# Patient Record
Sex: Female | Born: 1960 | Race: Black or African American | Hispanic: No | Marital: Married | Smoking: Never smoker
Health system: Southern US, Community
[De-identification: ages and names within clinical notes are randomized; demographics above are authoritative.]

## PROBLEM LIST (undated history)

## (undated) DIAGNOSIS — J45909 Unspecified asthma, uncomplicated: Secondary | ICD-10-CM

## (undated) DIAGNOSIS — M5126 Other intervertebral disc displacement, lumbar region: Secondary | ICD-10-CM

## (undated) DIAGNOSIS — E669 Obesity, unspecified: Secondary | ICD-10-CM

## (undated) DIAGNOSIS — I10 Essential (primary) hypertension: Secondary | ICD-10-CM

## (undated) DIAGNOSIS — M1990 Unspecified osteoarthritis, unspecified site: Secondary | ICD-10-CM

## (undated) DIAGNOSIS — R0602 Shortness of breath: Secondary | ICD-10-CM

## (undated) DIAGNOSIS — M2550 Pain in unspecified joint: Secondary | ICD-10-CM

## (undated) DIAGNOSIS — I509 Heart failure, unspecified: Secondary | ICD-10-CM

## (undated) DIAGNOSIS — R7303 Prediabetes: Secondary | ICD-10-CM

## (undated) DIAGNOSIS — M069 Rheumatoid arthritis, unspecified: Secondary | ICD-10-CM

## (undated) DIAGNOSIS — G4730 Sleep apnea, unspecified: Secondary | ICD-10-CM

## (undated) HISTORY — DX: Pain in unspecified joint: M25.50

## (undated) HISTORY — DX: Shortness of breath: R06.02

## (undated) HISTORY — DX: Unspecified osteoarthritis, unspecified site: M19.90

## (undated) HISTORY — PX: OTHER SURGICAL HISTORY: SHX169

## (undated) HISTORY — DX: Rheumatoid arthritis, unspecified: M06.9

## (undated) HISTORY — DX: Heart failure, unspecified: I50.9

## (undated) HISTORY — DX: Obesity, unspecified: E66.9

## (undated) HISTORY — DX: Other intervertebral disc displacement, lumbar region: M51.26

## (undated) HISTORY — PX: BREAST SURGERY: SHX581

---

## 1998-07-13 ENCOUNTER — Other Ambulatory Visit: Admission: RE | Admit: 1998-07-13 | Discharge: 1998-07-13 | Payer: Self-pay | Admitting: Obstetrics & Gynecology

## 1999-09-05 ENCOUNTER — Ambulatory Visit (HOSPITAL_COMMUNITY): Admission: RE | Admit: 1999-09-05 | Discharge: 1999-09-05 | Payer: Self-pay | Admitting: *Deleted

## 1999-12-19 ENCOUNTER — Other Ambulatory Visit: Admission: RE | Admit: 1999-12-19 | Discharge: 1999-12-19 | Payer: Self-pay | Admitting: Family Medicine

## 2000-01-02 ENCOUNTER — Ambulatory Visit (HOSPITAL_COMMUNITY): Admission: RE | Admit: 2000-01-02 | Discharge: 2000-01-02 | Payer: Self-pay | Admitting: Family Medicine

## 2000-01-02 ENCOUNTER — Encounter: Payer: Self-pay | Admitting: Family Medicine

## 2000-09-01 ENCOUNTER — Ambulatory Visit (HOSPITAL_BASED_OUTPATIENT_CLINIC_OR_DEPARTMENT_OTHER): Admission: RE | Admit: 2000-09-01 | Discharge: 2000-09-01 | Payer: Self-pay | Admitting: General Surgery

## 2000-09-07 ENCOUNTER — Other Ambulatory Visit: Admission: RE | Admit: 2000-09-07 | Discharge: 2000-09-07 | Payer: Self-pay | Admitting: Obstetrics and Gynecology

## 2000-12-16 ENCOUNTER — Ambulatory Visit (HOSPITAL_COMMUNITY): Admission: RE | Admit: 2000-12-16 | Discharge: 2000-12-16 | Payer: Self-pay | Admitting: Family Medicine

## 2000-12-16 ENCOUNTER — Encounter: Payer: Self-pay | Admitting: Family Medicine

## 2001-01-19 ENCOUNTER — Encounter: Payer: Self-pay | Admitting: Emergency Medicine

## 2001-01-19 ENCOUNTER — Emergency Department (HOSPITAL_COMMUNITY): Admission: EM | Admit: 2001-01-19 | Discharge: 2001-01-19 | Payer: Self-pay | Admitting: Emergency Medicine

## 2001-02-10 ENCOUNTER — Encounter: Payer: Self-pay | Admitting: Orthopedic Surgery

## 2001-02-10 ENCOUNTER — Encounter: Admission: RE | Admit: 2001-02-10 | Discharge: 2001-02-10 | Payer: Self-pay | Admitting: Orthopedic Surgery

## 2001-11-24 HISTORY — PX: REDUCTION MAMMAPLASTY: SUR839

## 2001-12-17 ENCOUNTER — Encounter: Payer: Self-pay | Admitting: Family Medicine

## 2001-12-17 ENCOUNTER — Ambulatory Visit (HOSPITAL_COMMUNITY): Admission: RE | Admit: 2001-12-17 | Discharge: 2001-12-17 | Payer: Self-pay | Admitting: Family Medicine

## 2002-04-27 ENCOUNTER — Other Ambulatory Visit: Admission: RE | Admit: 2002-04-27 | Discharge: 2002-04-27 | Payer: Self-pay | Admitting: Family Medicine

## 2002-12-26 ENCOUNTER — Encounter: Payer: Self-pay | Admitting: Family Medicine

## 2002-12-26 ENCOUNTER — Ambulatory Visit (HOSPITAL_COMMUNITY): Admission: RE | Admit: 2002-12-26 | Discharge: 2002-12-26 | Payer: Self-pay | Admitting: Family Medicine

## 2003-01-23 ENCOUNTER — Encounter: Admission: RE | Admit: 2003-01-23 | Discharge: 2003-01-23 | Payer: Self-pay | Admitting: Family Medicine

## 2003-01-23 ENCOUNTER — Encounter: Payer: Self-pay | Admitting: Family Medicine

## 2003-12-25 ENCOUNTER — Other Ambulatory Visit: Admission: RE | Admit: 2003-12-25 | Discharge: 2003-12-25 | Payer: Self-pay | Admitting: Family Medicine

## 2003-12-27 ENCOUNTER — Encounter: Admission: RE | Admit: 2003-12-27 | Discharge: 2003-12-27 | Payer: Self-pay | Admitting: Family Medicine

## 2003-12-29 ENCOUNTER — Ambulatory Visit (HOSPITAL_COMMUNITY): Admission: RE | Admit: 2003-12-29 | Discharge: 2003-12-29 | Payer: Self-pay | Admitting: Plastic Surgery

## 2004-02-06 ENCOUNTER — Encounter: Admission: RE | Admit: 2004-02-06 | Discharge: 2004-02-06 | Payer: Self-pay | Admitting: Family Medicine

## 2005-01-13 ENCOUNTER — Ambulatory Visit (HOSPITAL_COMMUNITY): Admission: RE | Admit: 2005-01-13 | Discharge: 2005-01-13 | Payer: Self-pay | Admitting: Family Medicine

## 2005-02-28 ENCOUNTER — Other Ambulatory Visit: Admission: RE | Admit: 2005-02-28 | Discharge: 2005-02-28 | Payer: Self-pay | Admitting: Family Medicine

## 2006-01-19 ENCOUNTER — Ambulatory Visit (HOSPITAL_COMMUNITY): Admission: RE | Admit: 2006-01-19 | Discharge: 2006-01-19 | Payer: Self-pay | Admitting: Family Medicine

## 2006-03-03 ENCOUNTER — Other Ambulatory Visit: Admission: RE | Admit: 2006-03-03 | Discharge: 2006-03-03 | Payer: Self-pay | Admitting: Family Medicine

## 2006-03-12 ENCOUNTER — Ambulatory Visit (HOSPITAL_COMMUNITY): Admission: RE | Admit: 2006-03-12 | Discharge: 2006-03-12 | Payer: Self-pay | Admitting: Family Medicine

## 2006-03-27 ENCOUNTER — Ambulatory Visit (HOSPITAL_COMMUNITY): Admission: RE | Admit: 2006-03-27 | Discharge: 2006-03-27 | Payer: Self-pay | Admitting: Family Medicine

## 2007-01-20 ENCOUNTER — Ambulatory Visit (HOSPITAL_COMMUNITY): Admission: RE | Admit: 2007-01-20 | Discharge: 2007-01-20 | Payer: Self-pay | Admitting: Emergency Medicine

## 2007-03-22 ENCOUNTER — Other Ambulatory Visit: Admission: RE | Admit: 2007-03-22 | Discharge: 2007-03-22 | Payer: Self-pay | Admitting: Emergency Medicine

## 2007-06-25 ENCOUNTER — Ambulatory Visit: Payer: Self-pay | Admitting: Gastroenterology

## 2007-06-25 LAB — CONVERTED CEMR LAB
ALT: 13 units/L (ref 0–35)
AST: 25 units/L (ref 0–37)
Albumin: 3.2 g/dL — ABNORMAL LOW (ref 3.5–5.2)
Alkaline Phosphatase: 81 units/L (ref 39–117)
BUN: 12 mg/dL (ref 6–23)
Calcium: 9.4 mg/dL (ref 8.4–10.5)
Chloride: 102 meq/L (ref 96–112)
Eosinophils Absolute: 0.1 10*3/uL (ref 0.0–0.6)
GFR calc Af Amer: 115 mL/min
GFR calc non Af Amer: 95 mL/min
Lymphocytes Relative: 39.1 % (ref 12.0–46.0)
MCV: 84.6 fL (ref 78.0–100.0)
Monocytes Relative: 5.7 % (ref 3.0–11.0)
Neutro Abs: 4 10*3/uL (ref 1.4–7.7)
Platelets: 285 10*3/uL (ref 150–400)
RBC: 4.14 M/uL (ref 3.87–5.11)
WBC: 7.5 10*3/uL (ref 4.5–10.5)

## 2007-06-26 ENCOUNTER — Encounter: Admission: RE | Admit: 2007-06-26 | Discharge: 2007-06-26 | Payer: Self-pay | Admitting: Gastroenterology

## 2007-07-27 ENCOUNTER — Ambulatory Visit: Payer: Self-pay | Admitting: Gastroenterology

## 2008-01-28 ENCOUNTER — Ambulatory Visit (HOSPITAL_COMMUNITY): Admission: RE | Admit: 2008-01-28 | Discharge: 2008-01-28 | Payer: Self-pay | Admitting: Emergency Medicine

## 2008-03-07 DIAGNOSIS — K5090 Crohn's disease, unspecified, without complications: Secondary | ICD-10-CM | POA: Insufficient documentation

## 2008-03-07 DIAGNOSIS — K612 Anorectal abscess: Secondary | ICD-10-CM | POA: Insufficient documentation

## 2008-03-07 DIAGNOSIS — I10 Essential (primary) hypertension: Secondary | ICD-10-CM | POA: Insufficient documentation

## 2008-03-27 ENCOUNTER — Other Ambulatory Visit: Admission: RE | Admit: 2008-03-27 | Discharge: 2008-03-27 | Payer: Self-pay | Admitting: Emergency Medicine

## 2009-01-29 ENCOUNTER — Ambulatory Visit (HOSPITAL_COMMUNITY): Admission: RE | Admit: 2009-01-29 | Discharge: 2009-01-29 | Payer: Self-pay | Admitting: Emergency Medicine

## 2009-04-27 ENCOUNTER — Other Ambulatory Visit: Admission: RE | Admit: 2009-04-27 | Discharge: 2009-04-27 | Payer: Self-pay | Admitting: Emergency Medicine

## 2009-10-10 ENCOUNTER — Encounter: Payer: Self-pay | Admitting: Gastroenterology

## 2010-01-30 ENCOUNTER — Ambulatory Visit (HOSPITAL_COMMUNITY): Admission: RE | Admit: 2010-01-30 | Discharge: 2010-01-30 | Payer: Self-pay | Admitting: Emergency Medicine

## 2010-07-30 ENCOUNTER — Other Ambulatory Visit: Admission: RE | Admit: 2010-07-30 | Discharge: 2010-07-30 | Payer: Self-pay | Admitting: Family Medicine

## 2010-08-30 ENCOUNTER — Ambulatory Visit (HOSPITAL_COMMUNITY): Admission: RE | Admit: 2010-08-30 | Discharge: 2010-08-30 | Payer: Self-pay | Admitting: General Surgery

## 2010-12-15 ENCOUNTER — Encounter: Payer: Self-pay | Admitting: Family Medicine

## 2010-12-15 ENCOUNTER — Encounter: Payer: Self-pay | Admitting: Emergency Medicine

## 2011-01-02 ENCOUNTER — Other Ambulatory Visit (HOSPITAL_COMMUNITY): Payer: Self-pay | Admitting: Family Medicine

## 2011-01-02 DIAGNOSIS — Z1231 Encounter for screening mammogram for malignant neoplasm of breast: Secondary | ICD-10-CM

## 2011-02-03 ENCOUNTER — Ambulatory Visit (HOSPITAL_COMMUNITY)
Admission: RE | Admit: 2011-02-03 | Discharge: 2011-02-03 | Disposition: A | Discharge status: Home / Self Care | Payer: 59 | Source: Ambulatory Visit | Attending: Family Medicine | Admitting: Family Medicine

## 2011-02-03 DIAGNOSIS — Z1231 Encounter for screening mammogram for malignant neoplasm of breast: Secondary | ICD-10-CM | POA: Insufficient documentation

## 2011-02-06 LAB — BASIC METABOLIC PANEL
Chloride: 103 mEq/L (ref 96–112)
Creatinine, Ser: 0.6 mg/dL (ref 0.4–1.2)
GFR calc Af Amer: >60 mL/min (ref >60)
Potassium: 3.1 mEq/L — ABNORMAL LOW (ref 3.5–5.1)
Sodium: 140 mEq/L (ref 135–145)

## 2011-02-06 LAB — SURGICAL PCR SCREEN: MRSA, PCR: NEGATIVE

## 2011-04-08 NOTE — Assessment & Plan Note (Signed)
[facility]                         GASTROENTEROLOGY OFFICE NOTE   [name], [name]                     MRN:          [id number]  DATE:06/25/2007                            DOB:          Jul 09, 1961    The patient was self referred.   PRIMARY CARE PHYSICIAN:  Oley Balm. Georgina Pillion, M.D.   REASON FOR VISIT:  Left buttocks discomfort, intermittent rectal  bleeding, nausea, intermittent diarrhea and vomiting.   HISTORY OF PRESENT ILLNESS:  [name] is a 50 year old woman who has a  rather confusing gastrointestinal history.  She was seen by [facility]  Gastroenterologist, Dr. Luther Parody, in the past who performed a  colonoscopy.  She believes 3-4 years ago but I find no record of that in  any of the computer systems here.  We will work through [facility] to get these records sent over.  She believes she was  told that she had Crohn's disease.  She was started on medicines for it.  She stopped the medicines and has not been back to see Dr. Luther Parody or  any of his partners since then.  She thinks she may have had polyps in  her colon as well.  She is here for complaints mainly of left buttocks  discomfort and when reviewing her Echart, I found records of a mass in  her left buttocks that was removed by Dr. Kendrick Ranch in October 2001.  He  described this as a small perianal lesion approximately 3-cm.  He  removed it and pathology showed it was a small abscess, so this was a  perianal abscess.  On physical exam she does have a scar in this area.  She does not recall this and does not believe that this was actually  her.   REVIEW OF SYSTEMS:  Notable for a 15 to 20-pound weight gain in the past  year.  She does have mild intermittent rectal bleeding, mild  intermittent diarrhea.  The rest of her review of systems is essentially  normal and is available on her nursing intake sheet.   PAST MEDICAL HISTORY:  1. Morbid obesity.  2. Hypertension.  3. Likely left perianal abscess, removed October 2001, by Dr. Kendrick Ranch (The patient is not clear that this was actually her,      although all indications were that it was, especially the scar in      the same area).  4. Question Crohn's disease, diagnosed by [facility] in      2003 or 2004, we are working to get those results sent over.  5. Left knee operation, 2002.   CURRENT MEDICATIONS:  Birth control pills, hydrochlorothiazide.   ALLERGIES:  No known drug allergies.   SOCIAL HISTORY:  Married with 3 sons, nonsmoker, nondrinker.   FAMILY HISTORY:  No colon cancer or colon polyps in family.   PHYSICAL EXAMINATION:  VITAL SIGNS:  Height 5 feet 3 inches, 257 pounds,  blood pressure 120/78, pulse 78.  CONSTITUTIONAL:  Morbidly obese, otherwise well-appearing.  NEUROLOGIC:  Alert and oriented x3.  EYES:  Extraocular movements intact.  MOUTH:  Oropharynx moist.  No lesions.  NECK:  Supple.  No lymphadenopathy.  CARDIOVASCULAR:  Heart regular rate and rhythm.  LUNGS:  Clear to auscultation bilaterally.  ABDOMEN:  Soft, nontender, nondistended.  Normal bowel sounds.  EXTREMITIES:  No lower extremity edema.  SKIN:  No rashes or lesions on visible extremities.  RECTAL/BUTTOCKS:  Done with female nurse in room.  Showed a small  elliptical scar just left of her anus.  Her anal exam was normal.  I  felt no masses in her rectum.  She does have some exudative granulation  pinkish tissue in the posterior crevice of her buttocks.  This is quite  distant from her anus.  She says this area tends to seep.  She is tender  in the left buttocks area.  I see no signs of abscess.  No skin  problems.   ASSESSMENT/PLAN:  A 50 year old woman with intermittent rectal bleeding,  question history of Crohn's.   1. First we will work to get the records sent over from [facility].  2. We will also get labs today including a CBC, a complete metabolic      profile, and  sed rate.  3. I will get a pelvic scan as she is tender on her left buttocks and      has had a history of a perianal abscess, possibly she has a      recurrent abscess, although perianally she is normal.  4. Certainly with a history of perianal abscess and question of      Crohn's disease in the past, that makes a good story that she      indeed does have Crohn's disease.  5. We will contact her as these results come back.  6. I will see her at the time of her colonoscopy as well.     Rachael Fee, MD  Electronically Signed    DPJ/MedQ  DD: 06/25/2007  DT: 06/25/2007  Job #: [id number]   cc:   Oley Balm. Georgina Pillion, M.D.

## 2011-04-11 NOTE — Op Note (Signed)
[place]  Patient:    [name] [name], [name] [name]                     MRN: [id number] Proc. Date: 09/01/00 Adm. Date:  [id number] Disc. Date: [id number] Attending:  Carson Myrtle                           Operative Report  PREOPERATIVE DIAGNOSIS:  Mass left buttock.  POSTOPERATIVE DIAGNOSIS:  Mass left buttock.  OPERATION:  Excision mass left buttock.  SURGEON:  Timothy E. Earlene Plater, M.D.  ANESTHESIA:  CRNA standby with IV sedation.  INDICATION FOR PROCEDURE:  Mrs. Zachery Dauer visited my office.  She is a 50 year old female healthy except for obesity. She has had a persistent apparently infectious mass on the left buttock for the past year.  She has been treated intermittently with antibiotics but has been persistent. She agrees to proceed with excision. She elected to do it at the outpatient center.  DESCRIPTION OF PROCEDURE:  The patient was taken to the operating room and placed supine. IV started.  Sedation given.  Placed in lithotomy.  The left buttock area was prepped and draped in the usual fashion.  Then 0.25% Marcaine with epinephrine was used for local anesthesia.  The lesion was approximately 1.5 x 1.5 irregular raised from the skin surface and colored pink.  It was well outside the anal verge on to the buttock skin. It was excised in an elliptical fashion radially from the anus.  Bleeding was controlled. The wound was closed with 3-0 Vicryl and 3-0 nylon.  Specimen to the lab.  Routine pathology.  Dressing applied.  Counts correct. She tolerated it well and was removed to recovery room in good condition.  Written and verbal instructions including Darvocet N. She will be followed in the office. DD:  09/01/00 TD:  09/02/00 Job: [id number]

## 2012-01-06 ENCOUNTER — Other Ambulatory Visit (HOSPITAL_COMMUNITY): Payer: Self-pay | Admitting: Family Medicine

## 2012-01-06 DIAGNOSIS — Z1231 Encounter for screening mammogram for malignant neoplasm of breast: Secondary | ICD-10-CM

## 2012-02-06 ENCOUNTER — Ambulatory Visit (HOSPITAL_COMMUNITY): Payer: 59

## 2012-02-09 ENCOUNTER — Ambulatory Visit (HOSPITAL_COMMUNITY)
Admission: RE | Admit: 2012-02-09 | Discharge: 2012-02-09 | Disposition: A | Discharge status: Home / Self Care | Payer: 59 | Source: Ambulatory Visit | Attending: Family Medicine | Admitting: Family Medicine

## 2012-02-09 DIAGNOSIS — Z1231 Encounter for screening mammogram for malignant neoplasm of breast: Secondary | ICD-10-CM | POA: Insufficient documentation

## 2012-03-14 ENCOUNTER — Inpatient Hospital Stay (HOSPITAL_COMMUNITY)
Admission: EM | Admit: 2012-03-14 | Discharge: 2012-03-16 | DRG: 872 | Disposition: A | Discharge status: Home / Self Care | Payer: 59 | Attending: Internal Medicine | Admitting: Internal Medicine

## 2012-03-14 ENCOUNTER — Emergency Department (HOSPITAL_COMMUNITY): Payer: 59

## 2012-03-14 ENCOUNTER — Encounter (HOSPITAL_COMMUNITY): Payer: Self-pay

## 2012-03-14 DIAGNOSIS — Z7982 Long term (current) use of aspirin: Secondary | ICD-10-CM

## 2012-03-14 DIAGNOSIS — R51 Headache: Secondary | ICD-10-CM

## 2012-03-14 DIAGNOSIS — E860 Dehydration: Secondary | ICD-10-CM

## 2012-03-14 DIAGNOSIS — E876 Hypokalemia: Secondary | ICD-10-CM

## 2012-03-14 DIAGNOSIS — I447 Left bundle-branch block, unspecified: Secondary | ICD-10-CM

## 2012-03-14 DIAGNOSIS — K5090 Crohn's disease, unspecified, without complications: Secondary | ICD-10-CM

## 2012-03-14 DIAGNOSIS — I10 Essential (primary) hypertension: Secondary | ICD-10-CM

## 2012-03-14 DIAGNOSIS — K612 Anorectal abscess: Secondary | ICD-10-CM

## 2012-03-14 DIAGNOSIS — R0902 Hypoxemia: Secondary | ICD-10-CM

## 2012-03-14 DIAGNOSIS — R42 Dizziness and giddiness: Secondary | ICD-10-CM

## 2012-03-14 DIAGNOSIS — I517 Cardiomegaly: Secondary | ICD-10-CM | POA: Diagnosis present

## 2012-03-14 DIAGNOSIS — I498 Other specified cardiac arrhythmias: Secondary | ICD-10-CM | POA: Diagnosis present

## 2012-03-14 DIAGNOSIS — E669 Obesity, unspecified: Secondary | ICD-10-CM

## 2012-03-14 DIAGNOSIS — E871 Hypo-osmolality and hyponatremia: Secondary | ICD-10-CM

## 2012-03-14 DIAGNOSIS — A419 Sepsis, unspecified organism: Principal | ICD-10-CM | POA: Diagnosis present

## 2012-03-14 DIAGNOSIS — Z79899 Other long term (current) drug therapy: Secondary | ICD-10-CM

## 2012-03-14 DIAGNOSIS — D72829 Elevated white blood cell count, unspecified: Secondary | ICD-10-CM | POA: Diagnosis present

## 2012-03-14 DIAGNOSIS — Z6841 Body Mass Index (BMI) 40.0 and over, adult: Secondary | ICD-10-CM

## 2012-03-14 DIAGNOSIS — R509 Fever, unspecified: Secondary | ICD-10-CM

## 2012-03-14 HISTORY — DX: Essential (primary) hypertension: I10

## 2012-03-14 LAB — URINALYSIS, ROUTINE W REFLEX MICROSCOPIC
Bilirubin Urine: NEGATIVE
Glucose, UA: NEGATIVE mg/dL
Nitrite: NEGATIVE
Nitrite: NEGATIVE
Specific Gravity, Urine: 1.039 — ABNORMAL HIGH (ref 1.005–1.030)
Specific Gravity, Urine: >1.046 — ABNORMAL HIGH (ref 1.005–1.030)
Urobilinogen, UA: 1 mg/dL (ref 0.0–1.0)
pH: 6.5 (ref 5.0–8.0)

## 2012-03-14 LAB — TROPONIN I: Troponin I: <0.3 ng/mL (ref <0.30)

## 2012-03-14 LAB — URINE MICROSCOPIC-ADD ON

## 2012-03-14 LAB — CBC
Hemoglobin: 13.5 g/dL (ref 12.0–15.0)
Hemoglobin: 11.3 g/dL — ABNORMAL LOW (ref 12.0–15.0)
MCH: 29 pg (ref 26.0–34.0)
MCHC: 34.2 g/dL (ref 30.0–36.0)
MCHC: 32.8 g/dL (ref 30.0–36.0)
Platelets: 246 10*3/uL (ref 150–400)
RDW: 14.1 % (ref 11.5–15.5)
RDW: 14.2 % (ref 11.5–15.5)
WBC: 10.4 10*3/uL (ref 4.0–10.5)

## 2012-03-14 LAB — LACTATE DEHYDROGENASE: LDH: 157 U/L (ref 94–250)

## 2012-03-14 LAB — COMPREHENSIVE METABOLIC PANEL
ALT: 18 U/L (ref 0–35)
AST: 22 U/L (ref 0–37)
AST: 18 U/L (ref 0–37)
Albumin: 3.3 g/dL — ABNORMAL LOW (ref 3.5–5.2)
Albumin: 2.8 g/dL — ABNORMAL LOW (ref 3.5–5.2)
Alkaline Phosphatase: 92 U/L (ref 39–117)
BUN: 14 mg/dL (ref 6–23)
Calcium: 7.7 mg/dL — ABNORMAL LOW (ref 8.4–10.5)
Potassium: 2.5 mEq/L — CL (ref 3.5–5.1)
Potassium: 2.6 mEq/L — CL (ref 3.5–5.1)
Sodium: 133 mEq/L — ABNORMAL LOW (ref 135–145)
Total Protein: 8.7 g/dL — ABNORMAL HIGH (ref 6.0–8.3)
Total Protein: 7.4 g/dL (ref 6.0–8.3)

## 2012-03-14 LAB — LIPASE, BLOOD: Lipase: 26 U/L (ref 11–59)

## 2012-03-14 LAB — DIFFERENTIAL
Basophils Absolute: 0.1 10*3/uL (ref 0.0–0.1)
Basophils Relative: 1 % (ref 0–1)
Eosinophils Absolute: 0 10*3/uL (ref 0.0–0.7)
Monocytes Relative: 7 % (ref 3–12)
Neutrophils Relative %: 79 % — ABNORMAL HIGH (ref 43–77)

## 2012-03-14 LAB — CARDIAC PANEL(CRET KIN+CKTOT+MB+TROPI): Total CK: 79 U/L (ref 7–177)

## 2012-03-14 LAB — MAGNESIUM: Magnesium: 1.6 mg/dL (ref 1.5–2.5)

## 2012-03-14 MED ORDER — DOCUSATE SODIUM 100 MG PO CAPS
100.0000 mg | ORAL_CAPSULE | Freq: Two times a day (BID) | ORAL | Status: DC
  Administered 2012-03-14 – 2012-03-16 (×4): 100 mg via ORAL
  Filled 2012-03-14 (×6): qty 1

## 2012-03-14 MED ORDER — ACETAMINOPHEN 650 MG RE SUPP: 650.0000 mg | Freq: Four times a day (QID) | RECTAL | Status: DC | PRN

## 2012-03-14 MED ORDER — OXYCODONE HCL 5 MG PO TABS: 5.0000 mg | ORAL_TABLET | ORAL | Status: DC | PRN

## 2012-03-14 MED ORDER — ENOXAPARIN SODIUM 40 MG/0.4ML ~~LOC~~ SOLN: 40.0000 mg | SUBCUTANEOUS | Status: DC

## 2012-03-14 MED ORDER — POTASSIUM CHLORIDE 10 MEQ/100ML IV SOLN
10.0000 meq | INTRAVENOUS | Status: AC
  Administered 2012-03-14 (×4): 10 meq via INTRAVENOUS
  Filled 2012-03-14 (×5): qty 100

## 2012-03-14 MED ORDER — SODIUM CHLORIDE 0.9 % IV BOLUS (SEPSIS)
1000.0000 mL | Freq: Once | INTRAVENOUS | Status: AC
  Administered 2012-03-14: 1000 mL via INTRAVENOUS

## 2012-03-14 MED ORDER — ALBUTEROL SULFATE (5 MG/ML) 0.5% IN NEBU: 2.5000 mg | INHALATION_SOLUTION | RESPIRATORY_TRACT | Status: DC | PRN

## 2012-03-14 MED ORDER — SODIUM CHLORIDE 0.9 % IV SOLN
INTRAVENOUS | Status: DC
  Administered 2012-03-14: 100 mL/h via INTRAVENOUS
  Administered 2012-03-15: 19:00:00 via INTRAVENOUS

## 2012-03-14 MED ORDER — ONDANSETRON HCL 4 MG/2ML IJ SOLN
4.0000 mg | Freq: Four times a day (QID) | INTRAMUSCULAR | Status: DC | PRN
  Filled 2012-03-14: qty 2

## 2012-03-14 MED ORDER — ACETAMINOPHEN 325 MG PO TABS
650.0000 mg | ORAL_TABLET | Freq: Once | ORAL | Status: AC
  Administered 2012-03-14: 650 mg via ORAL
  Filled 2012-03-14: qty 2

## 2012-03-14 MED ORDER — VANCOMYCIN HCL 1000 MG IV SOLR
2500.0000 mg | Freq: Once | INTRAVENOUS | Status: AC
  Administered 2012-03-14: 2500 mg via INTRAVENOUS
  Filled 2012-03-14: qty 2500

## 2012-03-14 MED ORDER — ONDANSETRON HCL 4 MG PO TABS: 4.0000 mg | ORAL_TABLET | Freq: Four times a day (QID) | ORAL | Status: DC | PRN

## 2012-03-14 MED ORDER — ENOXAPARIN SODIUM 60 MG/0.6ML ~~LOC~~ SOLN
60.0000 mg | SUBCUTANEOUS | Status: DC
  Administered 2012-03-14 – 2012-03-15 (×2): 60 mg via SUBCUTANEOUS
  Filled 2012-03-14 (×4): qty 0.6

## 2012-03-14 MED ORDER — ACETAMINOPHEN 325 MG PO TABS
650.0000 mg | ORAL_TABLET | Freq: Four times a day (QID) | ORAL | Status: DC | PRN
  Administered 2012-03-15: 650 mg via ORAL
  Filled 2012-03-14: qty 2

## 2012-03-14 MED ORDER — VANCOMYCIN HCL IN DEXTROSE 1-5 GM/200ML-% IV SOLN
1000.0000 mg | Freq: Two times a day (BID) | INTRAVENOUS | Status: DC
  Administered 2012-03-15 – 2012-03-16 (×3): 1000 mg via INTRAVENOUS
  Filled 2012-03-14 (×5): qty 200

## 2012-03-14 MED ORDER — HYDROMORPHONE HCL PF 1 MG/ML IJ SOLN: 0.5000 mg | INTRAMUSCULAR | Status: DC | PRN

## 2012-03-14 MED ORDER — DIPHENHYDRAMINE HCL 50 MG/ML IJ SOLN
25.0000 mg | Freq: Once | INTRAMUSCULAR | Status: AC
  Administered 2012-03-14: 25 mg via INTRAVENOUS
  Filled 2012-03-14: qty 1

## 2012-03-14 MED ORDER — POTASSIUM CHLORIDE 10 MEQ/100ML IV SOLN
10.0000 meq | Freq: Once | INTRAVENOUS | Status: AC
  Administered 2012-03-14: 10 meq via INTRAVENOUS

## 2012-03-14 MED ORDER — DIAZEPAM 5 MG/ML IJ SOLN
2.5000 mg | Freq: Once | INTRAMUSCULAR | Status: AC
  Administered 2012-03-14: 2.5 mg via INTRAVENOUS
  Filled 2012-03-14: qty 2

## 2012-03-14 MED ORDER — DEXTROSE 5 % IV SOLN
2.0000 g | INTRAVENOUS | Status: DC
  Administered 2012-03-14 – 2012-03-15 (×2): 2 g via INTRAVENOUS
  Filled 2012-03-14 (×3): qty 2

## 2012-03-14 MED ORDER — ALUM & MAG HYDROXIDE-SIMETH 200-200-20 MG/5ML PO SUSP: 30.0000 mL | Freq: Four times a day (QID) | ORAL | Status: DC | PRN

## 2012-03-14 MED ORDER — IBUPROFEN 100 MG/5ML PO SUSP
600.0000 mg | Freq: Once | ORAL | Status: AC
  Administered 2012-03-14: 600 mg via ORAL
  Filled 2012-03-14: qty 30

## 2012-03-14 MED ORDER — MECLIZINE HCL 25 MG PO TABS
12.5000 mg | ORAL_TABLET | Freq: Once | ORAL | Status: AC
  Administered 2012-03-14: 12.5 mg via ORAL
  Filled 2012-03-14: qty 1

## 2012-03-14 MED ORDER — MOXIFLOXACIN HCL IN NACL 400 MG/250ML IV SOLN
400.0000 mg | INTRAVENOUS | Status: DC
  Administered 2012-03-14 – 2012-03-15 (×2): 400 mg via INTRAVENOUS
  Filled 2012-03-14 (×3): qty 250

## 2012-03-14 MED ORDER — IOHEXOL 300 MG/ML  SOLN: 100.0000 mL | Freq: Once | INTRAMUSCULAR | Status: DC | PRN

## 2012-03-14 MED ORDER — METOCLOPRAMIDE HCL 5 MG/ML IJ SOLN
10.0000 mg | Freq: Once | INTRAMUSCULAR | Status: AC
  Administered 2012-03-14: 10 mg via INTRAVENOUS
  Filled 2012-03-14: qty 2

## 2012-03-14 NOTE — ED Provider Notes (Signed)
History     CSN: [id number]  Arrival date & time 03/14/12  4098   First MD Initiated Contact with Patient 03/14/12 1023      Chief Complaint  Patient presents with  . Dizziness  . Fatigue    (Consider location/radiation/quality/duration/timing/severity/associated sxs/prior treatment) HPI  51yoF h/o HTN pw multiple complaints. States that 6 days ago she began to experience dull frontal headache with rhinorrhea and nasal congestion, sensation of room spinning. Complaining of gradually worsening headache throughout the week to now 10/10. +Photophobia. Min b/l blurry vision. Denies pain in ears. Vertigo worse with movement. Also complaining of min shortness of breath and productive cough. States that she has never experienced headache this severe. Denies fever/chills/neck stiffness. Denies numbness/tingling/weakness of extremities. States she's having difficulty walking 2/2 vertigo. Dec PO intake x 3 days 2/2 nausea. No recent vomiting but dry heaving. Denies chest pain. Min SOB as above. Denies h/o VTE in self or family. No recent hosp/surg/immob. No h/o cancer. Denies exogenous hormone use, no leg pain or swelling. Generalized weakness. Denies hematuria/dysuria/freq/urgency. Denies abdominal pain. Has been taking amoxicillin prescribed by PMD for similar sx   ED Notes, ED Provider Notes from 03/14/12 0000 to 03/14/12 10:06:11       Lyndal Pulley, RN 03/14/2012 10:04      Pt in from home with c/o dizziness and weakness with decreased appetite for the past 3 days states generalized body aches and abd pain with n/v    Past Medical History  Diagnosis Date  . Hypertension     Past Surgical History  Procedure Date  . Breast surgery   . Joint replacement     No family history on file.  History  Substance Use Topics  . Smoking status: Never Smoker   . Smokeless tobacco: Not on file  . Alcohol Use: No    OB History    Grav Para Term Preterm Abortions TAB SAB Ect Mult Living                   Review of Systems  All other systems reviewed and are negative.  except as noted HPI   Allergies  Review of patient's allergies indicates no known allergies.  Home Medications   No current outpatient prescriptions on file.  BP 104/71  Pulse 82  Temp(Src) 98.1 F (36.7 C) (Oral)  Resp 20  Ht 5\' 3"  (1.6 m)  Wt 255 lb 4.7 oz (115.8 kg)  BMI 45.22 kg/m2  SpO2 98%  Physical Exam  Nursing note and vitals reviewed. Constitutional: She is oriented to person, place, and time. She appears well-developed.  HENT:  Head: Atraumatic.  Mouth/Throat: Oropharynx is clear and moist.       B/l TM with good light reflex no effusion (Rt TM partially visualized) Min posterior OP erythema Mm dry  Eyes: Conjunctivae and EOM are normal. Pupils are equal, round, and reactive to light.  Neck: Normal range of motion. Neck supple.  Cardiovascular: Normal rate, regular rhythm, normal heart sounds and intact distal pulses.        tachycardic  Pulmonary/Chest: Effort normal and breath sounds normal. No respiratory distress. She has no wheezes. She has no rales.  Abdominal: Soft. She exhibits no distension. There is no tenderness. There is no rebound and no guarding.  Musculoskeletal: Normal range of motion.  Neurological: She is alert and oriented to person, place, and time.       Strength 5/5 all extremities No pronator drift No facial droop  Gilberto Better +symptomatic with same  Skin: Skin is warm and dry. No rash noted.  Psychiatric: She has a normal mood and affect.    Date: 03/14/2012  Rate: 130  Rhythm: sinus tachycardia  QRS Axis: left  Intervals: normal  ST/T Wave abnormalities: nonspecific ST changes  Conduction Disutrbances:left bundle branch block  Narrative Interpretation:   Old EKG Reviewed: LBBB new compared to previous    ED Course  Procedures (including critical care time)  Labs Reviewed  DIFFERENTIAL - Abnormal; Notable for the following:     Neutrophils Relative 79 (*)    All other components within normal limits  COMPREHENSIVE METABOLIC PANEL - Abnormal; Notable for the following:    Sodium 131 (*)    Potassium 2.5 (*)    Chloride 90 (*)    Glucose, Bld 118 (*)    Total Protein 8.7 (*)    Albumin 3.3 (*)    All other components within normal limits  URINALYSIS, ROUTINE W REFLEX MICROSCOPIC - Abnormal; Notable for the following:    Color, Urine AMBER (*) BIOCHEMICALS MAY BE AFFECTED BY COLOR   Specific Gravity, Urine 1.039 (*)    Hgb urine dipstick TRACE (*)    Ketones, ur 15 (*)    Protein, ur >300 (*)    All other components within normal limits  CBC - Abnormal; Notable for the following:    Hemoglobin 11.3 (*)    HCT 34.5 (*)    All other components within normal limits  COMPREHENSIVE METABOLIC PANEL - Abnormal; Notable for the following:    Sodium 133 (*)    Potassium 2.6 (*)    Glucose, Bld 195 (*)    Calcium 7.7 (*)    Albumin 2.8 (*)    All other components within normal limits  CBC  TROPONIN I  LIPASE, BLOOD  URINE MICROSCOPIC-ADD ON  CARDIAC PANEL(CRET KIN+CKTOT+MB+TROPI)  LACTATE DEHYDROGENASE  MAGNESIUM  CARDIAC PANEL(CRET KIN+CKTOT+MB+TROPI)  URINALYSIS, ROUTINE W REFLEX MICROSCOPIC  CULTURE, BLOOD (ROUTINE X 2)  CULTURE, BLOOD (ROUTINE X 2)  BASIC METABOLIC PANEL  BASIC METABOLIC PANEL  CBC  CORTISOL  PROCALCITONIN  STREP PNEUMONIAE URINARY ANTIGEN  LEGIONELLA ANTIGEN, URINE  CARDIAC PANEL(CRET KIN+CKTOT+MB+TROPI)   Ct Head Wo Contrast  03/14/2012  *RADIOLOGY REPORT*  Clinical Data: Dizziness and weakness.  Decreased appetite.  CT HEAD WITHOUT CONTRAST  Technique:  Contiguous axial images were obtained from the base of the skull through the vertex without contrast.  Comparison: None.  Findings: Bone windows demonstrate mucosal thickening of sphenoid sinuses, mild. Clear mastoid air cells.  Soft tissue windows demonstrate no  mass lesion, hemorrhage, hydrocephalus, acute infarct, intra-axial,  or extra-axial fluid collection.  IMPRESSION:  1. No acute intracranial abnormality. 2.  Sinus disease.  Original Report Authenticated By: Consuello Bossier, M.D.   Ct Angio Chest W/cm &/or Wo Cm  03/14/2012  *RADIOLOGY REPORT*  Clinical Data:  Hypoxia.  Shortness of breath and fatigue.  CT ANGIOGRAPHY CHEST  Technique:  Multidetector CT imaging of the chest using the standard protocol during bolus administration of intravenous contrast. Multiplanar reconstructed images including MIPs were obtained and reviewed to evaluate the vascular anatomy.  Contrast:  100  ml Omnipaque-300  Comparison: Plain film of 08/30/2010.  No prior CT.  Findings: Lung windows demonstrate minimal motion degradation. Exam also degraded by patient body habitus.  Soft tissue windows:  The quality of this exam for evaluation of pulmonary embolism is poor.  In addition to patient's size, the bolus is poorly  timed.  The majority of the contrast is in the SVC.  No central pulmonary embolism.  No large lobar embolism to the lower lobes.  Smaller emboli cannot be excluded.  A bovine arch. Normal aortic caliber without dissection.  Mild cardiomegaly. No pericardial or pleural effusion.  7 mm high right paratracheal node. Not pathologic by size criteria.  No hilar adenopathy.  Limited abdominal imaging demonstrates a splenule. No acute osseous abnormality.  IMPRESSION: 1.  Multifactorial degradation, including bolus timing, the patient's size, and mild motion artifact.  No large/central embolism.  Decision was made to forego repeat study, after consultation with Dr. Hyman Hopes.  2.  Cardiomegaly, without other explanation for patient's symptoms.  Original Report Authenticated By: Consuello Bossier, M.D.     1. Vertigo   2. Fever   3. Headache   4. Hypoxia       MDM  Patient with multiple complaints including worse headache of her life (gradual onset), vertigo, cough, sob, low grade fever. Noted to be tachycardic with new LBBB on EKG. 1. Headache-  Unlikely SAH given onset, less likely TA. May be related to infectious type symptoms but low suspicion meningitis/encephalitis. IVF, reglan, benadryl. Given age will evaluate with CT head r/o brain mass. Reassess. 2. Low grade fever, SOB, tachycardia, hypoxia- Suspect PNA v/s pulmonary embolism. Mild dehydration. CTA ordered to evaluate. Anticipate admission.  Patient with continued symptoms despite rx in ED. Valium ordered. CTA and CT head as above. U/A pending-- second liter of IVF ordered and nsg staff unable to obtain adequate urine sample with straight cath. Patient given tylenol/ibuprofen for tmax 102. Discussed admission with triad hospitalist. Family updated.          Forbes Cellar, MD 03/14/12 2315

## 2012-03-14 NOTE — Progress Notes (Signed)
CRITICAL VALUE ALERT  Critical value received:  k 2.6  Date of notification:  03/14/12  Time of notification:  1815  Critical value read back: yes   Nurse who received alert:  Verdon Cummins  MD notified (1st page):  Lanae Crumbly  Time of first page:  1820  MD notified (2nd page):  Time of second page:  Responding MD:  Lanae Crumbly  Time MD responded: [phone]

## 2012-03-14 NOTE — ED Notes (Signed)
Pt in from home with c/o dizziness and weakness with decreased appetite for the past 3 days states generalized body aches and abd pain with n/v

## 2012-03-14 NOTE — Progress Notes (Signed)
ANTIBIOTIC CONSULT NOTE - INITIAL  Pharmacy Consult for Vancomycin Indication: rule out sepsis  No Known Allergies  Patient Measurements: Height: 5\' 3"  (160 cm) Weight: 263 lb 10.7 oz (119.6 kg) IBW/kg (Calculated) : 52.4    Vital Signs: Temp: 98.1 F (36.7 C) (04/21 1630) Temp src: Oral (04/21 1630) BP: 103/68 mmHg (04/21 1850) Pulse Rate: 94  (04/21 1850)        Labs:  Basename 03/14/12 1700 03/14/12 1048  WBC 10.4 9.8  HGB 11.3* 13.5  PLT 228 246  LABCREA -- --  CREATININE 0.63 0.68   Estimated Creatinine Clearance: 101.8 ml/min (by C-G formula based on Cr of 0.63).   Microbiology: No results found for this or any previous visit (from the past 720 hour(s)).  Medical History: Past Medical History  Diagnosis Date  . Hypertension     Medications:  Scheduled:    . acetaminophen  650 mg Oral Once  . cefTRIAXone (ROCEPHIN)  IV  2 g Intravenous Q24H  . diazepam  2.5 mg Intravenous Once  . diphenhydrAMINE  25 mg Intravenous Once  . docusate sodium  100 mg Oral BID  . enoxaparin (LOVENOX) injection  60 mg Subcutaneous Q24H  . ibuprofen  600 mg Oral Once  . meclizine  12.5 mg Oral Once  . metoCLOPramide (REGLAN) injection  10 mg Intravenous Once  . moxifloxacin  400 mg Intravenous Q24H  . potassium chloride  10 mEq Intravenous Q1 Hr x 5  . sodium chloride  1,000 mL Intravenous Once  . sodium chloride  1,000 mL Intravenous Once  . DISCONTD: enoxaparin  40 mg Subcutaneous Q24H   Infusions:    . sodium chloride 100 mL/hr (03/14/12 1806)   Assessment: 51 yo female admitted for suspected sepsis    Goal of Therapy:  Vancomycin trough level 15-20 mcg/ml  Plan:   Follow up culture results  Load with Vancomycin 2500mg   Follow with Vancomycin IV q 12 hours  Check trough level at steady state (goal above)  Loletta Specter 03/14/2012,7:19 PM

## 2012-03-14 NOTE — Progress Notes (Signed)
Spoke with Dr. Earlene Plater to review pt's symptoms, skin very wet/diaphoretic, lethargic, does respond to name, but states "I feel so dizzy". Labs are normal other than k+, which is currently being replaced. Writer unable to pinpoint problem, and Dr. Earlene Plater agrees and states she felt pt symptoms of being lethargic and diaphoretic are worrisome and feels pt warrants observation in step-down unit. Report of patient called to Amy RN charge nurse ICU. Pt is stable, but will transfer shortly.

## 2012-03-14 NOTE — Progress Notes (Signed)
Pt's spouse was called and made aware that his wife had been transferred to room 1232. His questions regarding her care were answered.

## 2012-03-14 NOTE — ED Notes (Signed)
Pt c/o burning w/ K.  Rate turned down to 75 mL/hr.

## 2012-03-14 NOTE — Progress Notes (Signed)
Called to room by primary nurse to start second PIV as pt is receiving antibiotics and K+.

## 2012-03-14 NOTE — Progress Notes (Signed)
Report was called and given to [facility]. Pt will be transferred to room 1232.

## 2012-03-14 NOTE — ED Notes (Signed)
Nurses having trouble getting IV.  Charge nurse to attempt.

## 2012-03-14 NOTE — H&P (Signed)
PCP:  Deboraha Sprang at [facility]   Chief Complaint:  Headache and dizziness  HPI: Patient is a 51 year old African American female past medical history significant for hypertension. Patient stated that a week ago she was not feeling well  So she  went to her primary care physician for her physical and at that time was given prescription for amoxicillin which she has been taking. She also got her immunization,  DTaP at that office visit. She complains of continuing to feel bad now with headache and dizziness. She has minimal cough. She has a one episode of nausea vomiting yesterday. She has no chest pain no shortness of breath no abdominal pain. She does have some night sweats at home no blurred vision no stiff neck.  Subjective fevers and chills .  Review of Systems:  10 point review of systems negative otherwise stated in history of present illness.    Past Medical History: Past Medical History  Diagnosis Date  . Hypertension    Past Surgical History  Procedure Date  . Breast surgery   . Joint replacement     Medications: Prior to Admission medications   Medication Sig Start Date End Date Taking? Authorizing Provider  amoxicillin (AMOXIL) 500 MG capsule Take 500 mg by mouth 3 (three) times daily. 10 day course of therapy; started 03/09/2012   Yes Historical Provider, MD  aspirin 325 MG tablet Take 650 mg by mouth 2 (two) times daily as needed. For pain.   Yes Historical Provider, MD  quinapril-hydrochlorothiazide (ACCURETIC) 20-12.5 MG per tablet Take 1 tablet by mouth daily.   Yes Historical Provider, MD    Allergies:  No Known Allergies  Social History: Reports that she has never smoked. She does not have any smokeless tobacco history on file. She reports that she does not drink alcohol or use illicit drugs. Patient is married she has one child. 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   Family History: Both parents are deceased. Mother had heart problems and father had  diabetes.  Physical Exam: Filed Vitals:   03/14/12 1005 03/14/12 1205 03/14/12 1336  BP: 129/95 121/67 107/72  Pulse: 134 129 115  Temp: 100.8 F (38.2 C) 102.8 F (39.3 C) 98.4 F (36.9 C)  TempSrc: Oral Oral Oral  Resp: 20 34 16  SpO2: 94% 94% 93%  General: Obese white female does not seem to be in any acute distress. HEENT: Head normocephalic atraumatic pupils reactive to light throat without erythema, no increased work of breathing. Cardiovascular: Tachycardic no murmurs rubs or gallops. Lungs: Clear bilaterally no wheezes,rhonch, or rales. Abdomen: Soft nontender nondistended positive bowel sounds, obese Extremities: No edema Neuro exam: Cranial nerves 2-12 intact. Strength 5 out of 5 throughout.     Labs on Admission:   [facility] 03/14/12 1048  NA 131*  K 2.5*  CL 90*  CO2 27  GLUCOSE 118*  BUN 14  CREATININE 0.68  CALCIUM 9.2  MG --  PHOS --    Basename 03/14/12 1048  AST 22  ALT 21  ALKPHOS 92  BILITOT 0.5  PROT 8.7*  ALBUMIN 3.3*    Basename 03/14/12 1048  LIPASE 26  AMYLASE --    Basename 03/14/12 1048  WBC 9.8  NEUTROABS 7.7  HGB 13.5  HCT 39.5  MCV 84.9  PLT 246    Basename 03/14/12 1048  CKTOTAL --  CKMB --  CKMBINDEX --  TROPONINI <0.30    Radiological Exams on Admission: Ct Head Wo Contrast  03/14/2012  *  RADIOLOGY REPORT*  Clinical Data: Dizziness and weakness.  Decreased appetite.  CT HEAD WITHOUT CONTRAST  Technique:  Contiguous axial images were obtained from the base of the skull through the vertex without contrast.  Comparison: None.  Findings: Bone windows demonstrate mucosal thickening of sphenoid sinuses, mild. Clear mastoid air cells.  Soft tissue windows demonstrate no  mass lesion, hemorrhage, hydrocephalus, acute infarct, intra-axial, or extra-axial fluid collection.  IMPRESSION:  1. No acute intracranial abnormality. 2.  Sinus disease.  Original Report Authenticated By: Consuello Bossier, M.D.   Ct Angio Chest W/cm &/or  Wo Cm  03/14/2012  *RADIOLOGY REPORT*  Clinical Data:  Hypoxia.  Shortness of breath and fatigue.  CT ANGIOGRAPHY CHEST  Technique:  Multidetector CT imaging of the chest using the standard protocol during bolus administration of intravenous contrast. Multiplanar reconstructed images including MIPs were obtained and reviewed to evaluate the vascular anatomy.  Contrast:  100  ml Omnipaque-300  Comparison: Plain film of 08/30/2010.  No prior CT.  Findings: Lung windows demonstrate minimal motion degradation. Exam also degraded by patient body habitus.  Soft tissue windows:  The quality of this exam for evaluation of pulmonary embolism is poor.  In addition to patient's size, the bolus is poorly timed.  The majority of the contrast is in the SVC.  No central pulmonary embolism.  No large lobar embolism to the lower lobes.  Smaller emboli cannot be excluded.  A bovine arch. Normal aortic caliber without dissection.  Mild cardiomegaly. No pericardial or pleural effusion.  7 mm high right paratracheal node. Not pathologic by size criteria.  No hilar adenopathy.  Limited abdominal imaging demonstrates a splenule. No acute osseous abnormality.  IMPRESSION: 1.  Multifactorial degradation, including bolus timing, the patient's size, and mild motion artifact.  No large/central embolism.  Decision was made to forego repeat study, after consultation with Dr. Hyman Hopes.  2.  Cardiomegaly, without other explanation for patient's symptoms.  Original Report Authenticated By: Consuello Bossier, M.D.    Assessment/Plan Fever of unknown origin  So far no source for fever. Patient does not seem to have meningitis as she does not have any nuchal rigidity, she is not confused she has no neurologic symptoms. Will get blood cultures x2 urinalysis chest x-ray and CT negative for infection but will emperically cover her with Avelox. Patient was on Augmentin prior to admission.   Presently unsure of the  etiology for her fevers headache. She  does have some sinus disease on her head CT. Discussed with infectious disease. Recommendation is to hold off on lumbar puncture and check blood cultures.  Dehydration/tachycardia Continue IV fluids and monitor symptoms.   Hypertension hold antihypertensive medications secondary to dehydration and low blood pressure.   Headache Headache could be coming from a viral illness versus sinusitis. She has no neurologic symptom. Discussed with infectious disease Dr Ilsa Iha. Since patient does not have any neurologic symptoms/ nuchal regidity will hold off on lumbar puncture.   Hypokalemia Secondary to vomiting and diuretics. Replete and check magnesium level   Hyponatremia  Secondary to diuretic. Hold hydrochlorothiazide and fluid resuscitate. Monitor sodium levels.   Cardiomegaly: Cardiomegaly was seen on CT scan. Will get 2-D echo.  Time spent on this patient including examination and decision-making process: 60  minutes.  Carollee Massed [phone] 03/14/2012, 3:16 PM

## 2012-03-15 ENCOUNTER — Inpatient Hospital Stay (HOSPITAL_COMMUNITY): Payer: 59

## 2012-03-15 LAB — BASIC METABOLIC PANEL
CO2: 23 mEq/L (ref 19–32)
CO2: 24 mEq/L (ref 19–32)
Calcium: 7.5 mg/dL — ABNORMAL LOW (ref 8.4–10.5)
Chloride: 101 mEq/L (ref 96–112)
Creatinine, Ser: 0.5 mg/dL (ref 0.50–1.10)
Glucose, Bld: 128 mg/dL — ABNORMAL HIGH (ref 70–99)
Glucose, Bld: 98 mg/dL (ref 70–99)
Sodium: 139 mEq/L (ref 135–145)

## 2012-03-15 LAB — CARDIAC PANEL(CRET KIN+CKTOT+MB+TROPI)
CK, MB: 1.7 ng/mL (ref 0.3–4.0)
Relative Index: 1.4 (ref 0.0–2.5)
Total CK: 125 U/L (ref 7–177)
Total CK: 155 U/L (ref 7–177)

## 2012-03-15 LAB — CBC
Hemoglobin: 11.3 g/dL — ABNORMAL LOW (ref 12.0–15.0)
MCH: 28.1 pg (ref 26.0–34.0)
MCV: 85.6 fL (ref 78.0–100.0)
Platelets: 201 10*3/uL (ref 150–400)
RBC: 4.02 MIL/uL (ref 3.87–5.11)

## 2012-03-15 MED ORDER — LISINOPRIL 20 MG PO TABS
20.0000 mg | ORAL_TABLET | Freq: Every day | ORAL | Status: DC
  Administered 2012-03-15 – 2012-03-16 (×2): 20 mg via ORAL
  Filled 2012-03-15 (×2): qty 1

## 2012-03-15 MED ORDER — POTASSIUM CHLORIDE CRYS ER 20 MEQ PO TBCR
40.0000 meq | EXTENDED_RELEASE_TABLET | ORAL | Status: AC
  Administered 2012-03-15 (×2): 40 meq via ORAL
  Filled 2012-03-15 (×2): qty 2

## 2012-03-15 NOTE — Progress Notes (Signed)
  Echocardiogram 2D Echocardiogram has been performed.  [name] [name] A 03/15/2012, 9:38 AM

## 2012-03-15 NOTE — Progress Notes (Signed)
CARE MANAGEMENT NOTE 03/15/2012  Patient:  [name], [name]   Account Number:  [id number]  Date Initiated:  03/15/2012  Documentation initiated by:  Nysha Koplin  Subjective/Objective Assessment:   admitted with poss sepsis     Action/Plan:   lives at home   Anticipated DC Date:  03/18/2012   Anticipated DC Plan:  HOME/SELF CARE  In-house referral  NA      DC Planning Services  NA      [facility] Choice  NA   Choice offered to / List presented to:  NA   DME arranged  NA      DME agency  NA     HH arranged  NA      HH agency  NA   Status of service:  In process, will continue to follow Medicare Important Message given?  NA - LOS <3 / Initial given by admissions (If response is "NO", the following Medicare IM given date fields will be blank) Date Medicare IM given:   Date Additional Medicare IM given:    Discharge Disposition:    Per UR Regulation:  Reviewed for med. necessity/level of care/duration of stay  If discussed at Long Length of Stay Meetings, dates discussed:    Comments:  [id number]/[name] [name] [name] [name] Case Management [phone]

## 2012-03-15 NOTE — Progress Notes (Signed)
Subjective: Patient feels a lot better this morning. She is alert and able to answer questions appropriately. No headache.  Objective: Weight change:   Intake/Output Summary (Last 24 hours) at 03/15/12 1221 Last data filed at 03/15/12 1200  Gross per 24 hour  Intake   2175 ml  Output   2075 ml  Net    100 ml    Filed Vitals:   03/15/12 1200  BP:   Pulse:   Temp: 98.5 F (36.9 C)   General: Patient appears her stated age. Cardiovascular: Regular rate rhythm. Lungs: Clear to auscultations bilaterally no wheezes rhonchus or rales. Abdomen: Obese positive bowel sounds nontender. Extremities no edema. Neuro exam nonfocal.  Lab Results: Reviewed.  Micro Results: Recent Results (from the past 240 hour(s))  CULTURE, BLOOD (ROUTINE X 2)     Status: Normal (Preliminary result)   Collection Time   03/14/12  5:00 PM      Component Value Range Status Comment   Specimen Description BLOOD LEFT ARM   Final    Special Requests     Final    Value: BOTTLES DRAWN AEROBIC AND ANAEROBIC 2CC BOTH BOTTLES   Culture  Setup Time [id number]   Final    Culture     Final    Value:        BLOOD CULTURE RECEIVED NO GROWTH TO DATE CULTURE WILL BE HELD FOR 5 DAYS BEFORE ISSUING A FINAL NEGATIVE REPORT   Report Status PENDING   Incomplete   CULTURE, BLOOD (ROUTINE X 2)     Status: Normal (Preliminary result)   Collection Time   03/14/12  5:12 PM      Component Value Range Status Comment   Specimen Description BLOOD LEFT ARM   Final    Special Requests     Final    Value: BOTTLES DRAWN AEROBIC AND ANAEROBIC 10CC BOTH BOTTLES   Culture  Setup Time [id number]   Final    Culture     Final    Value:        BLOOD CULTURE RECEIVED NO GROWTH TO DATE CULTURE WILL BE HELD FOR 5 DAYS BEFORE ISSUING A FINAL NEGATIVE REPORT   Report Status PENDING   Incomplete   MRSA PCR SCREENING     Status: Normal   Collection Time   03/15/12  1:36 AM      Component Value Range Status Comment   MRSA by PCR NEGATIVE   NEGATIVE  Final     Studies/Results: Dg Chest 2 View  03/15/2012  *RADIOLOGY REPORT*  Clinical Data: Rule out pneumonia  CHEST - 2 VIEW  Comparison: 03/14/2012  Findings: Heart size appears normal.  There is no pleural effusion or pulmonary edema.  No airspace consolidation identified.  The visualized osseous structures are unremarkable.  IMPRESSION:  1.  No evidence for pneumonia.  Original Report Authenticated By: Rosealee Albee, M.D.   Ct Head Wo Contrast  03/14/2012  *RADIOLOGY REPORT*  Clinical Data: Dizziness and weakness.  Decreased appetite.  CT HEAD WITHOUT CONTRAST  Technique:  Contiguous axial images were obtained from the base of the skull through the vertex without contrast.  Comparison: None.  Findings: Bone windows demonstrate mucosal thickening of sphenoid sinuses, mild. Clear mastoid air cells.  Soft tissue windows demonstrate no  mass lesion, hemorrhage, hydrocephalus, acute infarct, intra-axial, or extra-axial fluid collection.  IMPRESSION:  1. No acute intracranial abnormality. 2.  Sinus disease.  Original Report Authenticated By: Consuello Bossier, M.D.   Ct  Angio Chest W/cm &/or Wo Cm  03/14/2012  *RADIOLOGY REPORT*  Clinical Data:  Hypoxia.  Shortness of breath and fatigue.  CT ANGIOGRAPHY CHEST  Technique:  Multidetector CT imaging of the chest using the standard protocol during bolus administration of intravenous contrast. Multiplanar reconstructed images including MIPs were obtained and reviewed to evaluate the vascular anatomy.  Contrast:  100  ml Omnipaque-300  Comparison: Plain film of 08/30/2010.  No prior CT.  Findings: Lung windows demonstrate minimal motion degradation. Exam also degraded by patient body habitus.  Soft tissue windows:  The quality of this exam for evaluation of pulmonary embolism is poor.  In addition to patient's size, the bolus is poorly timed.  The majority of the contrast is in the SVC.  No central pulmonary embolism.  No large lobar embolism to the  lower lobes.  Smaller emboli cannot be excluded.  A bovine arch. Normal aortic caliber without dissection.  Mild cardiomegaly. No pericardial or pleural effusion.  7 mm high right paratracheal node. Not pathologic by size criteria.  No hilar adenopathy.  Limited abdominal imaging demonstrates a splenule. No acute osseous abnormality.  IMPRESSION: 1.  Multifactorial degradation, including bolus timing, the patient's size, and mild motion artifact.  No large/central embolism.  Decision was made to forego repeat study, after consultation with Dr. Hyman Hopes.  2.  Cardiomegaly, without other explanation for patient's symptoms.  Original Report Authenticated By: Consuello Bossier, M.D.   Medications: Scheduled Meds:   . cefTRIAXone (ROCEPHIN)  IV  2 g Intravenous Q24H  . diazepam  2.5 mg Intravenous Once  . docusate sodium  100 mg Oral BID  . enoxaparin (LOVENOX) injection  60 mg Subcutaneous Q24H  . ibuprofen  600 mg Oral Once  . moxifloxacin  400 mg Intravenous Q24H  . potassium chloride  10 mEq Intravenous Q1 Hr x 5  . potassium chloride  10 mEq Intravenous Once  . potassium chloride  40 mEq Oral Q4H  . sodium chloride  1,000 mL Intravenous Once  . sodium chloride  1,000 mL Intravenous Once  . vancomycin  2,500 mg Intravenous Once  . vancomycin  1,000 mg Intravenous Q12H  . DISCONTD: enoxaparin  40 mg Subcutaneous Q24H   Continuous Infusions:   . sodium chloride 50 mL/hr at 03/15/12 0832   PRN Meds:.acetaminophen, acetaminophen, albuterol, alum & mag hydroxide-simeth, HYDROmorphone, ondansetron (ZOFRAN) IV, ondansetron, oxyCODONE, DISCONTD: iohexol  Assessment/Plan:   FUO (fever of unknown origin) (03/14/2012) Unsure of the etiology. Blood cultures x2 negative chest x-ray and CT angiography chest negative UA negative. Will continue antibiotics for 24 more hours and then switched to by mouth Avelox for a total of 7 days. Patient feels back to her baseline.    Hypokalemia (03/14/2012) Replete  potassium.    Hyponatremia (03/14/2012) Most likely secondary to dehydration. Resolved.    Dehydration (03/14/2012) Resolved. Decrease dose of IV fluids.   LBBB (left bundle branch block) (03/14/2012) 2-D echo pending  Obesity Encourage diet and exercise  Hypertension   Resume blood pressure medication.  Disposition: Tomorrow if stable.   LOS: 1 day   Carollee Massed Pager [phone] 03/15/2012, 12:21 PM

## 2012-03-16 LAB — LEGIONELLA ANTIGEN, URINE: Legionella Antigen, Urine: NEGATIVE

## 2012-03-16 LAB — CBC
MCH: 28.4 pg (ref 26.0–34.0)
RBC: 3.91 MIL/uL (ref 3.87–5.11)
RDW: 14.4 % (ref 11.5–15.5)

## 2012-03-16 LAB — COMPREHENSIVE METABOLIC PANEL
Albumin: 2.6 g/dL — ABNORMAL LOW (ref 3.5–5.2)
Alkaline Phosphatase: 67 U/L (ref 39–117)
BUN: 6 mg/dL (ref 6–23)
Chloride: 105 mEq/L (ref 96–112)
Creatinine, Ser: 0.64 mg/dL (ref 0.50–1.10)
GFR calc Af Amer: >90 mL/min (ref >90)
GFR calc non Af Amer: >90 mL/min (ref >90)
Glucose, Bld: 82 mg/dL (ref 70–99)
Potassium: 3.6 mEq/L (ref 3.5–5.1)
Total Bilirubin: 0.3 mg/dL (ref 0.3–1.2)

## 2012-03-16 MED ORDER — MOXIFLOXACIN HCL 400 MG PO TABS: 400.0000 mg | ORAL_TABLET | Freq: Every day | ORAL | Status: AC

## 2012-03-16 NOTE — Discharge Summary (Signed)
DISCHARGE SUMMARY  [name] [name]  MR#: [id number]  DOB:1961-04-06  Date of Admission: 03/14/2012 Date of Discharge: 03/16/2012  Attending Physician:Cailey Trigueros JARRETT  Patient's WUJ:WJXBJ at [facility]  Consults: -Phone consult ID  Discharge Diagnoses: .FUO (fever of unknown origin)/Sepsis Syndrome .Hypokalemia .Hyponatremia .Dehydration .LBBB (left bundle branch block)/ ? Cardiomegaly  Initial presentation: Patient is a 51 year old African American female past medical history significant for hypertension. Patient stated that a week ago she was not feeling well So she went to her primary care physician for her physical and at that time was given prescription for amoxicillin which she has been taking. She also got her immunization, DTaP at that office visit. She complains of continuing to feel bad now with headache and dizziness. She has minimal cough. She has a one episode of nausea vomiting yesterday. She has no chest pain no shortness of breath no abdominal pain. She does have some night sweats at home no blurred vision no stiff neck. Subjective fevers and chills .    Hospital Course: FUO (fever of unknown origin) Patient was admitted to the hospital with fevers, decrease in mental status, and leukocytosis. She had CT of the chest done that did not show pneumonia, her urinalysis was normal, blood cultures negative x2. She was admitted to the general telemetry floor .  She then rapidly declined  while on the floor.  She became very lethargic and clammy and had to be transferred to the step down unit. She was empirically started on Zosyn, Avelox and vancomycin for sepsis syndrome. Over 24 hours of IV antibiotic she greatly improved. Source of infection could not be determined. Patient was discharged on Avelox for 7 days and she will follow up with her primary care physician.   .Hypokalemia Patient had low potassium on admission. This is most likely secondary to dehydration as well  as her diuretic for blood pressure control. Her potassium was repleted during this hospitalization.   .Hyponatremia/.Dehydration Patient was also dehydrated at the time of admission. She received several liters of IV fluid.    Marland KitchenLBBB (left bundle branch block)/question cardiomegaly Patient had question of cardiomegaly seen on CT scan and her EKG showed new left bundle branch block. Her cardiac markers were cycled and were negative she had a 2-D echo done that was normal. She will follow up outpatient with her primary care doctor.  Medication List  As of 03/16/2012 12:45 PM   STOP taking these medications         amoxicillin 500 MG capsule      aspirin 325 MG tablet         TAKE these medications         moxifloxacin 400 MG tablet   Commonly known as: AVELOX   Take 1 tablet (400 mg total) by mouth daily.      quinapril-hydrochlorothiazide 20-12.5 MG per tablet   Commonly known as: ACCURETIC   Take 1 tablet by mouth daily.             Day of Discharge BP 116/77  Pulse 95  Temp(Src) 98.8 F (37.1 C) (Oral)  Resp 19  Ht 5\' 3"  (1.6 m)  Wt 115.8 kg (255 lb 4.7 oz)  BMI 45.22 kg/m2  SpO2 97%  Physical Exam: General: Patient appears her stated age.  Cardiovascular: Regular rate rhythm.  Lungs: Clear to auscultations bilaterally no wheezes rhonchus or rales.  Abdomen: Obese positive bowel sounds nontender.  Extremities no edema.  Neuro exam nonfocal.  Results for orders placed during the hospital encounter of 03/14/12 (from the past 24 hour(s))  CBC     Status: Abnormal   Collection Time   03/16/12  5:31 AM      Component Value Range   WBC 8.4  4.0 - 10.5 (K/uL)   RBC 3.91  3.87 - 5.11 (MIL/uL)   Hemoglobin 11.1 (*) 12.0 - 15.0 (g/dL)   HCT 95.6 (*) 21.3 - 46.0 (%)   MCV 85.7  78.0 - 100.0 (fL)   MCH 28.4  26.0 - 34.0 (pg)   MCHC 33.1  30.0 - 36.0 (g/dL)   RDW 08.6  57.8 - 46.9 (%)   Platelets 121 (*) 150 - 400 (K/uL)  COMPREHENSIVE METABOLIC PANEL      Status: Abnormal   Collection Time   03/16/12  5:31 AM      Component Value Range   Sodium 136  135 - 145 (mEq/L)   Potassium 3.6  3.5 - 5.1 (mEq/L)   Chloride 105  96 - 112 (mEq/L)   CO2 22  19 - 32 (mEq/L)   Glucose, Bld 82  70 - 99 (mg/dL)   BUN 6  6 - 23 (mg/dL)   Creatinine, Ser 6.29  0.50 - 1.10 (mg/dL)   Calcium 7.9 (*) 8.4 - 10.5 (mg/dL)   Total Protein 6.7  6.0 - 8.3 (g/dL)   Albumin 2.6 (*) 3.5 - 5.2 (g/dL)   AST 19  0 - 37 (U/L)   ALT 18  0 - 35 (U/L)   Alkaline Phosphatase 67  39 - 117 (U/L)   Total Bilirubin 0.3  0.3 - 1.2 (mg/dL)   GFR calc non Af Amer >90  >90 (mL/min)   GFR calc Af Amer >90  >90 (mL/min)    Disposition:Home  Follow-up Appts: Discharge Orders    Future Orders Please Complete By Expires   Increase activity slowly         Follow-up Information    Follow up with Primary Doctor in 1 week.         Tests Needing Follow-up: none  Time spent in discharge (includes decision making & examination of pt): 45 minutes  Signed: Carollee Massed Pager [phone] 03/16/2012, 12:45 PM

## 2012-03-20 LAB — CULTURE, BLOOD (ROUTINE X 2)
Culture  Setup Time: 201304212150
Culture: NO GROWTH

## 2012-12-13 ENCOUNTER — Ambulatory Visit: Payer: Self-pay

## 2012-12-13 ENCOUNTER — Ambulatory Visit (INDEPENDENT_AMBULATORY_CARE_PROVIDER_SITE_OTHER): Payer: Self-pay | Admitting: Family Medicine

## 2012-12-13 VITALS — BP 126/84 | HR 78 | Temp 98.7°F | Resp 17 | Ht 64.0 in | Wt 270.0 lb

## 2012-12-13 DIAGNOSIS — J3489 Other specified disorders of nose and nasal sinuses: Secondary | ICD-10-CM

## 2012-12-13 DIAGNOSIS — H938X9 Other specified disorders of ear, unspecified ear: Secondary | ICD-10-CM

## 2012-12-13 LAB — POCT CBC
HCT, POC: 42.1 % (ref 37.7–47.9)
Hemoglobin: 13.2 g/dL (ref 12.2–16.2)
Lymph, poc: 2.5 (ref 0.6–3.4)
MCH, POC: 27.7 pg (ref 27–31.2)
MCHC: 31.4 g/dL — AB (ref 31.8–35.4)
MCV: 88.2 fL (ref 80–97)
MPV: 9.3 fL (ref 0–99.8)
RBC: 4.77 M/uL (ref 4.04–5.48)
WBC: 6.5 10*3/uL (ref 4.6–10.2)

## 2012-12-13 MED ORDER — AMOXICILLIN 500 MG PO CAPS: 1000.0000 mg | ORAL_CAPSULE | Freq: Two times a day (BID) | ORAL | Status: DC

## 2012-12-13 MED ORDER — FLUTICASONE PROPIONATE 50 MCG/ACT NA SUSP: 2.0000 | Freq: Every day | NASAL | Status: DC

## 2012-12-13 NOTE — Progress Notes (Signed)
[facility] [address], [place] [address] [phone]  Date:  12/13/2012   Name:  [name] [name]   DOB:  December 31, 1960   MRN:  [id number]  PCP:  No primary provider on file.    Chief Complaint: Sinusitis and Otalgia   History of Present Illness:  [name] [name] is a 52 y.o. very pleasant female patient who presents with the following:  She is here today with concern regarding sinusitis.  She has noted symptoms for 3 or 4 days.  She notes sinus drainage and some mild wheezing.  She has noted congestion in her left ear, and now into her right ear.  The ear is not really painful, but is uncomfortable.  She is not coughing- except she did have a cold a couple of weeks ago and she was coughing then.    No ST.  She has noted feelings of being hot and cold at night.  During the day she feels ok.   She tried putting peroxide in her ears, and some ear wax remover OTC. She has never been a smoker.     Patient Active Problem List  Diagnosis  . OBESITY  . HYPERTENSION  . CROHNS DISEASE  . ABSCESS, PERIRECTAL  . FUO (fever of unknown origin)  . Hypokalemia  . Hyponatremia  . Dehydration  . LBBB (left bundle branch block)    Past Medical History  Diagnosis Date  . Hypertension     Past Surgical History  Procedure Date  . Breast surgery   . Joint replacement     History  Substance Use Topics  . Smoking status: Never Smoker   . Smokeless tobacco: Not on file  . Alcohol Use: No    History reviewed. No pertinent family history.  No Known Allergies  Medication list has been reviewed and updated.  Current Outpatient Prescriptions on File Prior to Visit  Medication Sig Dispense Refill  . mesalamine (ASACOL) 400 MG EC tablet Take 400 mg by mouth 3 (three) times daily.      . quinapril-hydrochlorothiazide (ACCURETIC) 20-12.5 MG per tablet Take 1 tablet by mouth daily.        Review of Systems:  As per HPI- otherwise negative.   Physical  Examination: Filed Vitals:   12/13/12 0840  BP: 126/84  Pulse: 78  Temp: 98.7 F (37.1 C)  Resp: 17   Filed Vitals:   12/13/12 0840  Height: 5\' 4"  (1.626 m)  Weight: 270 lb (122.471 kg)   Body mass index is 46.35 kg/(m^2). Ideal Body Weight: Weight in (lb) to have BMI = 25: 145.3   GEN: WDWN, NAD, Non-toxic, A & O x 3, obese HEENT: Atraumatic, Normocephalic. Neck supple. No masses, No LAD. Bilateral TM wnl, oropharynx normal.  PEERL,EOMI.   Ears and Nose: No external deformity. CV: RRR, No M/G/R. No JVD. No thrill. No extra heart sounds. PULM: CTA B, no wheezes, crackles, rhonchi. No retractions. No resp. distress. No accessory muscle use. EXTR: No c/c/e NEURO Normal gait.  PSYCH: Normally interactive. Conversant. Not depressed or anxious appearing.  Calm demeanor.   Results for orders placed in visit on 12/13/12  POCT CBC      Component Value Range   WBC 6.5  4.6 - 10.2 K/uL   Lymph, poc 2.5  0.6 - 3.4   POC LYMPH PERCENT 37.7  10 - 50 %L   MID (cbc) 0.3  0 - 0.9   POC MID % 4.8  0 - 12 %M   POC Granulocyte 3.7  2 - 6.9   Granulocyte percent 57.5  37 - 80 %G   RBC 4.77  4.04 - 5.48 M/uL   Hemoglobin 13.2  12.2 - 16.2 g/dL   HCT, POC 96.0  45.4 - 47.9 %   MCV 88.2  80 - 97 fL   MCH, POC 27.7  27 - 31.2 pg   MCHC 31.4 (*) 31.8 - 35.4 g/dL   RDW, POC 09.8     Platelet Count, POC 302  142 - 424 K/uL   MPV 9.3  0 - 99.8 fL    Assessment and Plan: 1. Sinus pressure  POCT CBC, fluticasone (FLONASE) 50 MCG/ACT nasal spray, amoxicillin (AMOXIL) 500 MG capsule  2. Ear congestion  fluticasone (FLONASE) 50 MCG/ACT nasal spray   See patient instructions for more details.  Likely a viral illness, try flonase but it not better in a few days may fill amox     COPLAND,JESSICA, MD

## 2012-12-13 NOTE — Patient Instructions (Addendum)
It appears that you have a viral infection today.  Try using the nasal spray to see if it will be helpful.  If you are not better in a few days you may fill and try the antibiotic rx (amoxicillin).    Let us know if you are getting worse.

## 2013-01-04 ENCOUNTER — Other Ambulatory Visit (HOSPITAL_COMMUNITY): Payer: Self-pay | Admitting: Family Medicine

## 2013-01-04 DIAGNOSIS — Z0000 Encounter for general adult medical examination without abnormal findings: Secondary | ICD-10-CM

## 2013-01-04 DIAGNOSIS — Z1231 Encounter for screening mammogram for malignant neoplasm of breast: Secondary | ICD-10-CM

## 2013-02-09 ENCOUNTER — Ambulatory Visit (HOSPITAL_COMMUNITY)
Admission: RE | Admit: 2013-02-09 | Discharge: 2013-02-09 | Disposition: A | Discharge status: Home / Self Care | Payer: 59 | Source: Ambulatory Visit | Attending: Family Medicine | Admitting: Family Medicine

## 2013-02-09 DIAGNOSIS — Z1231 Encounter for screening mammogram for malignant neoplasm of breast: Secondary | ICD-10-CM | POA: Insufficient documentation

## 2013-03-16 ENCOUNTER — Other Ambulatory Visit: Payer: Self-pay | Admitting: Family Medicine

## 2013-03-16 ENCOUNTER — Other Ambulatory Visit (HOSPITAL_COMMUNITY)
Admission: RE | Admit: 2013-03-16 | Discharge: 2013-03-16 | Disposition: A | Discharge status: Home / Self Care | Payer: 59 | Source: Ambulatory Visit | Attending: Family Medicine | Admitting: Family Medicine

## 2013-03-16 DIAGNOSIS — Z124 Encounter for screening for malignant neoplasm of cervix: Secondary | ICD-10-CM | POA: Insufficient documentation

## 2013-08-30 ENCOUNTER — Ambulatory Visit: Payer: 59

## 2013-08-30 ENCOUNTER — Ambulatory Visit (INDEPENDENT_AMBULATORY_CARE_PROVIDER_SITE_OTHER): Payer: 59 | Admitting: Family Medicine

## 2013-08-30 VITALS — BP 130/84 | HR 90 | Temp 98.5°F | Resp 18 | Ht 64.0 in | Wt 275.6 lb

## 2013-08-30 DIAGNOSIS — J029 Acute pharyngitis, unspecified: Secondary | ICD-10-CM

## 2013-08-30 DIAGNOSIS — R509 Fever, unspecified: Secondary | ICD-10-CM

## 2013-08-30 DIAGNOSIS — R059 Cough, unspecified: Secondary | ICD-10-CM

## 2013-08-30 DIAGNOSIS — R05 Cough: Secondary | ICD-10-CM

## 2013-08-30 LAB — POCT CBC
Lymph, poc: 1.4 (ref 0.6–3.4)
MCH, POC: 28.5 pg (ref 27–31.2)
MCHC: 31.2 g/dL — AB (ref 31.8–35.4)
MCV: 91.3 fL (ref 80–97)
MID (cbc): 0.5 (ref 0–0.9)
POC LYMPH PERCENT: 16.3 %L (ref 10–50)
POC MID %: 5.5 %M (ref 0–12)
Platelet Count, POC: 245 10*3/uL (ref 142–424)
RBC: 4.81 M/uL (ref 4.04–5.48)
WBC: 8.7 10*3/uL (ref 4.6–10.2)

## 2013-08-30 LAB — POCT RAPID STREP A (OFFICE): Rapid Strep A Screen: NEGATIVE

## 2013-08-30 MED ORDER — CEFDINIR 300 MG PO CAPS: 300.0000 mg | ORAL_CAPSULE | Freq: Two times a day (BID) | ORAL | Status: DC

## 2013-08-30 MED ORDER — ACETAMINOPHEN 325 MG PO TABS: 1000.0000 mg | ORAL_TABLET | Freq: Once | ORAL | Status: DC

## 2013-08-30 MED ORDER — IBUPROFEN 200 MG PO TABS: 400.0000 mg | ORAL_TABLET | Freq: Once | ORAL | Status: DC

## 2013-08-30 NOTE — Progress Notes (Addendum)
[facility] [address], [place] [address] [phone]  Date:  08/30/2013   Name:  [name] [name]   DOB:  08/01/1961   MRN:  [id number]  PCP:  No PCP Per Patient    Chief Complaint: Cough, Wheezing, Sore Throat and Fever   History of Present Illness:  [name] [name] is a 52 y.o. very pleasant female patient who presents with the following:  Here today with illness.  Today is Tuesday.  She noted onset of cough on Saturday, then wheezing Sunday. She noted a ST yesterday, and tried taking benadryl.  She felt hot and cold yesterday.   She did not go to work yesterday. She does note chills, and body aches.   Her worst sx are cough and ST. No GI symptoms. She has not had much appetite but has been drinking water  Patient Active Problem List   Diagnosis Date Noted  . FUO (fever of unknown origin) 03/14/2012  . Hypokalemia 03/14/2012  . Hyponatremia 03/14/2012  . Dehydration 03/14/2012  . LBBB (left bundle branch block) 03/14/2012  . OBESITY 03/07/2008  . HYPERTENSION 03/07/2008  . CROHNS DISEASE 03/07/2008  . ABSCESS, PERIRECTAL 03/07/2008    Past Medical History  Diagnosis Date  . Hypertension     Past Surgical History  Procedure Laterality Date  . Breast surgery    . Joint replacement      History  Substance Use Topics  . Smoking status: Never Smoker   . Smokeless tobacco: Not on file  . Alcohol Use: No    History reviewed. No pertinent family history.  No Known Allergies  Medication list has been reviewed and updated.  Current Outpatient Prescriptions on File Prior to Visit  Medication Sig Dispense Refill  . fluticasone (FLONASE) 50 MCG/ACT nasal spray Place 2 sprays into the nose daily.  16 g  6  . quinapril-hydrochlorothiazide (ACCURETIC) 20-12.5 MG per tablet Take 1 tablet by mouth daily.      Marland Kitchen amoxicillin (AMOXIL) 500 MG capsule Take 2 capsules (1,000 mg total) by mouth 2 (two) times daily.  40 capsule  0  . mesalamine  (ASACOL) 400 MG EC tablet Take 400 mg by mouth 3 (three) times daily.       No current facility-administered medications on file prior to visit.    Review of Systems:  As per HPI- otherwise negative.   Physical Examination: Filed Vitals:   08/30/13 0751  BP: 130/84  Pulse: 127  Temp: 102.5 F (39.2 C)  Resp: 18   Filed Vitals:   08/30/13 0751  Height: 5\' 4"  (1.626 m)  Weight: 275 lb 9.6 oz (125.011 kg)   Body mass index is 47.28 kg/(m^2). Ideal Body Weight: Weight in (lb) to have BMI = 25: 145.3  GEN: WDWN, NAD, Non-toxic, A & O x 3, obese HEENT: Atraumatic, Normocephalic. Neck supple. No masses, No LAD.  Bilateral TM wnl, oropharynx normal.  PEERL,EOMI.   Ears and Nose: No external deformity. CV: RRR, No M/G/R. No JVD. No thrill. No extra heart sounds. PULM: CTA B, no wheezes, crackles, rhonchi. No retractions. No resp. distress. No accessory muscle use. ABD: S, NT, ND. No rebound. No HSM. EXTR: No c/c/e NEURO Normal gait.  PSYCH: Normally interactive. Conversant. Not depressed or anxious appearing.  Calm demeanor.   [facility] reading (PRIMARY) by  Dr. Patsy Lager. CXR:  Patchy left sided infiltrate  EXAM: CHEST 2 VIEW  COMPARISON: 03/15/2012  FINDINGS: Lateral view is under penetrated and  moderately degraded.  Remote left rib trauma. Midline trachea. Normal heart size and mediastinal contours. No pleural effusion or pneumothorax. Lung volumes are low. Mild volume loss at the bases. Cannot exclude patchy left lower lobe airspace disease, likely in the superior segment.  IMPRESSION: Possible superior segment left lower lobe airspace disease. Consider short term radiographic followup. This corresponds to the preliminary interpretation by the clinical service.  Degraded lateral view.  Results for orders placed in visit on 08/30/13  POCT INFLUENZA A/B      Result Value Range   Influenza A, POC Negative     Influenza B, POC Negative    POCT RAPID STREP A (OFFICE)       Result Value Range   Rapid Strep A Screen Negative  Negative  POCT CBC      Result Value Range   WBC 8.7  4.6 - 10.2 K/uL   Lymph, poc 1.4  0.6 - 3.4   POC LYMPH PERCENT 16.3  10 - 50 %L   MID (cbc) 0.5  0 - 0.9   POC MID % 5.5  0 - 12 %M   POC Granulocyte 6.8  2 - 6.9   Granulocyte percent 78.2  37 - 80 %G   RBC 4.81  4.04 - 5.48 M/uL   Hemoglobin 13.7  12.2 - 16.2 g/dL   HCT, POC 40.9  81.1 - 47.9 %   MCV 91.3  80 - 97 fL   MCH, POC 28.5  27 - 31.2 pg   MCHC 31.2 (*) 31.8 - 35.4 g/dL   RDW, POC 91.4     Platelet Count, POC 245  142 - 424 K/uL   MPV 9.4  0 - 99.8 fL   Treated with 1000mg  of tylenol on arrival.  Given 400 mg of ibuprofen at 9:05 am  Assessment and Plan: Cough - Plan: POCT Influenza A/B, POCT CBC, DG Chest 2 View, cefdinir (OMNICEF) 300 MG capsule  Fever, unspecified - Plan: POCT rapid strep A, POCT CBC, cefdinir (OMNICEF) 300 MG capsule, acetaminophen (TYLENOL) tablet 975 mg, ibuprofen (ADVIL,MOTRIN) tablet 400 mg  Acute pharyngitis - Plan: POCT rapid strep A  Mild pneumonia.  Treat with omnicef, rest, fluids, antipyretics.   Discussed over- read report with her; recommend repeat CXR in about one month.   See patient instructions for more details.   Note for her job.   Signed Abbe Amsterdam, MD  10/8; called to check on her.  She is about the same, maybe a little better.  Asked her to let me know if not turning the corner by tomorrow.

## 2013-08-30 NOTE — Patient Instructions (Addendum)
Rest and drink plenty of fluids.  Eat a bland diet as you are able.    If you are not feeling better in the next 2 days please give me a call- Sooner if worse.   Use ibuprofen and / or tylenol to keep you fever down.  Use the omnicef as directed

## 2013-09-27 ENCOUNTER — Ambulatory Visit (INDEPENDENT_AMBULATORY_CARE_PROVIDER_SITE_OTHER): Payer: 59 | Admitting: Family Medicine

## 2013-09-27 ENCOUNTER — Ambulatory Visit: Payer: 59

## 2013-09-27 VITALS — BP 128/76 | HR 98 | Temp 98.7°F | Resp 18 | Ht 64.0 in | Wt 279.6 lb

## 2013-09-27 DIAGNOSIS — J3489 Other specified disorders of nose and nasal sinuses: Secondary | ICD-10-CM

## 2013-09-27 DIAGNOSIS — R059 Cough, unspecified: Secondary | ICD-10-CM

## 2013-09-27 DIAGNOSIS — R0602 Shortness of breath: Secondary | ICD-10-CM

## 2013-09-27 DIAGNOSIS — R05 Cough: Secondary | ICD-10-CM

## 2013-09-27 DIAGNOSIS — R058 Other specified cough: Secondary | ICD-10-CM

## 2013-09-27 DIAGNOSIS — H938X3 Other specified disorders of ear, bilateral: Secondary | ICD-10-CM

## 2013-09-27 MED ORDER — PREDNISONE 20 MG PO TABS: ORAL_TABLET | ORAL | Status: DC

## 2013-09-27 MED ORDER — BENZONATATE 100 MG PO CAPS: 100.0000 mg | ORAL_CAPSULE | Freq: Three times a day (TID) | ORAL | Status: DC | PRN

## 2013-09-27 MED ORDER — ALBUTEROL SULFATE HFA 108 (90 BASE) MCG/ACT IN AERS: 2.0000 | INHALATION_SPRAY | Freq: Four times a day (QID) | RESPIRATORY_TRACT | Status: DC | PRN

## 2013-09-27 MED ORDER — ALBUTEROL SULFATE (2.5 MG/3ML) 0.083% IN NEBU
2.5000 mg | INHALATION_SOLUTION | Freq: Once | RESPIRATORY_TRACT | Status: AC
  Administered 2013-09-27: 2.5 mg via RESPIRATORY_TRACT

## 2013-09-27 MED ORDER — FLUTICASONE PROPIONATE 50 MCG/ACT NA SUSP: 2.0000 | Freq: Every day | NASAL | Status: DC

## 2013-09-27 NOTE — Progress Notes (Signed)
Subjective: 52 year old lady who was here last month and seen by Dr. Dallas Schimke. She had a cough. Chest x-ray at that time showed a little left lower lobe infiltrate thought to be in mild pneumonia. She was treated. She has persisted in having a cough intermittently through the day. She can get coughing so hard that she gags and throws up. She has not been feverish. She still has a little discomfort in her head and ears. She does not smoke. She works for [employer] as a Occupational psychologist. She has not had prolonged respiratory tract infections like this much in the past. She does not feel particularly sick.  Objective: TMs are normal. Throat clear. Neck supple without significant nodes. Chest is clear to percussion and auscultation. Heart regular without murmurs.  Assessment: Prolonged bronchitis History of eustachian tube dysfunction and otalgia  Plan: She wants her Flonase refilled. Repeat chest x-ray Get a peak flow and give her a trial of albuterol neb  Peak flow improved from 330 to 360 to 380  [facility] reading (PRIMARY) by  Dr. Alwyn Ren Previous infiltrate does not appear apparent.  Imp: Bronchitis, post viral?.  Will treat with a course of prednisone, Tessalon pills for the cough, and an albuterol inhaler.  Return if further problems

## 2013-11-15 ENCOUNTER — Ambulatory Visit (INDEPENDENT_AMBULATORY_CARE_PROVIDER_SITE_OTHER): Payer: 59 | Admitting: Podiatry

## 2013-11-15 ENCOUNTER — Encounter: Payer: Self-pay | Admitting: Podiatry

## 2013-11-15 ENCOUNTER — Ambulatory Visit (INDEPENDENT_AMBULATORY_CARE_PROVIDER_SITE_OTHER): Payer: 59

## 2013-11-15 VITALS — BP 127/85 | HR 106 | Resp 16 | Ht 63.0 in | Wt 275.0 lb

## 2013-11-15 DIAGNOSIS — M722 Plantar fascial fibromatosis: Secondary | ICD-10-CM

## 2013-11-15 MED ORDER — METHYLPREDNISOLONE (PAK) 4 MG PO TABS: ORAL_TABLET | ORAL | Status: DC

## 2013-11-15 MED ORDER — MELOXICAM 15 MG PO TABS: 15.0000 mg | ORAL_TABLET | Freq: Every day | ORAL | Status: DC

## 2013-11-15 NOTE — Progress Notes (Signed)
N pain L right heel D 4m O slowly C worse A walking T tylenol

## 2013-11-15 NOTE — Patient Instructions (Signed)
Plantar Fasciitis (Heel Spur Syndrome) with Rehab The plantar fascia is a fibrous, ligament-like, soft-tissue structure that spans the bottom of the foot. Plantar fasciitis is a condition that causes pain in the foot due to inflammation of the tissue. SYMPTOMS   Pain and tenderness on the underneath side of the foot.  Pain that worsens with standing or walking. CAUSES  Plantar fasciitis is caused by irritation and injury to the plantar fascia on the underneath side of the foot. Common mechanisms of injury include:  Direct trauma to bottom of the foot.  Damage to a small nerve that runs under the foot where the main fascia attaches to the heel bone.  Stress placed on the plantar fascia due to bone spurs. RISK INCREASES WITH:   Activities that place stress on the plantar fascia (running, jumping, pivoting, or cutting).  Poor strength and flexibility.  Improperly fitted shoes.  Tight calf muscles.  Flat feet.  Failure to warm-up properly before activity.  Obesity. PREVENTION  Warm up and stretch properly before activity.  Allow for adequate recovery between workouts.  Maintain physical fitness:  Strength, flexibility, and endurance.  Cardiovascular fitness.  Maintain a health body weight.  Avoid stress on the plantar fascia.  Wear properly fitted shoes, including arch supports for individuals who have flat feet. PROGNOSIS  If treated properly, then the symptoms of plantar fasciitis usually resolve without surgery. However, occasionally surgery is necessary. RELATED COMPLICATIONS   Recurrent symptoms that may result in a chronic condition.  Problems of the lower back that are caused by compensating for the injury, such as limping.  Pain or weakness of the foot during push-off following surgery.  Chronic inflammation, scarring, and partial or complete fascia tear, occurring more often from repeated injections. TREATMENT  Treatment initially involves the use of  ice and medication to help reduce pain and inflammation. The use of strengthening and stretching exercises may help reduce pain with activity, especially stretches of the Achilles tendon. These exercises may be performed at home or with a therapist. Your caregiver may recommend that you use heel cups of arch supports to help reduce stress on the plantar fascia. Occasionally, corticosteroid injections are given to reduce inflammation. If symptoms persist for greater than 6 months despite non-surgical (conservative), then surgery may be recommended.  MEDICATION   If pain medication is necessary, then nonsteroidal anti-inflammatory medications, such as aspirin and ibuprofen, or other minor pain relievers, such as acetaminophen, are often recommended.  Do not take pain medication within 7 days before surgery.  Prescription pain relievers may be given if deemed necessary by your caregiver. Use only as directed and only as much as you need.  Corticosteroid injections may be given by your caregiver. These injections should be reserved for the most serious cases, because they may only be given a certain number of times. HEAT AND COLD  Cold treatment (icing) relieves pain and reduces inflammation. Cold treatment should be applied for 10 to 15 minutes every 2 to 3 hours for inflammation and pain and immediately after any activity that aggravates your symptoms. Use ice packs or massage the area with a piece of ice (ice massage).  Heat treatment may be used prior to performing the stretching and strengthening activities prescribed by your caregiver, physical therapist, or athletic trainer. Use a heat pack or soak the injury in warm water. SEEK IMMEDIATE MEDICAL CARE IF:  Treatment seems to offer no benefit, or the condition worsens.  Any medications produce adverse side effects. EXERCISES RANGE   OF MOTION (ROM) AND STRETCHING EXERCISES - Plantar Fasciitis (Heel Spur Syndrome) These exercises may help you  when beginning to rehabilitate your injury. Your symptoms may resolve with or without further involvement from your physician, physical therapist or athletic trainer. While completing these exercises, remember:   Restoring tissue flexibility helps normal motion to return to the joints. This allows healthier, less painful movement and activity.  An effective stretch should be held for at least 30 seconds.  A stretch should never be painful. You should only feel a gentle lengthening or release in the stretched tissue. RANGE OF MOTION - Toe Extension, Flexion  Sit with your right / left leg crossed over your opposite knee.  Grasp your toes and gently pull them back toward the top of your foot. You should feel a stretch on the bottom of your toes and/or foot.  Hold this stretch for __________ seconds.  Now, gently pull your toes toward the bottom of your foot. You should feel a stretch on the top of your toes and or foot.  Hold this stretch for __________ seconds. Repeat __________ times. Complete this stretch __________ times per day.  RANGE OF MOTION - Ankle Dorsiflexion, Active Assisted  Remove shoes and sit on a chair that is preferably not on a carpeted surface.  Place right / left foot under knee. Extend your opposite leg for support.  Keeping your heel down, slide your right / left foot back toward the chair until you feel a stretch at your ankle or calf. If you do not feel a stretch, slide your bottom forward to the edge of the chair, while still keeping your heel down.  Hold this stretch for __________ seconds. Repeat __________ times. Complete this stretch __________ times per day.  STRETCH  Gastroc, Standing  Place hands on wall.  Extend right / left leg, keeping the front knee somewhat bent.  Slightly point your toes inward on your back foot.  Keeping your right / left heel on the floor and your knee straight, shift your weight toward the wall, not allowing your back to  arch.  You should feel a gentle stretch in the right / left calf. Hold this position for __________ seconds. Repeat __________ times. Complete this stretch __________ times per day. STRETCH  Soleus, Standing  Place hands on wall.  Extend right / left leg, keeping the other knee somewhat bent.  Slightly point your toes inward on your back foot.  Keep your right / left heel on the floor, bend your back knee, and slightly shift your weight over the back leg so that you feel a gentle stretch deep in your back calf.  Hold this position for __________ seconds. Repeat __________ times. Complete this stretch __________ times per day. STRETCH  Gastrocsoleus, Standing  Note: This exercise can place a lot of stress on your foot and ankle. Please complete this exercise only if specifically instructed by your caregiver.   Place the ball of your right / left foot on a step, keeping your other foot firmly on the same step.  Hold on to the wall or a rail for balance.  Slowly lift your other foot, allowing your body weight to press your heel down over the edge of the step.  You should feel a stretch in your right / left calf.  Hold this position for __________ seconds.  Repeat this exercise with a slight bend in your right / left knee. Repeat __________ times. Complete this stretch __________ times per day.    STRENGTHENING EXERCISES - Plantar Fasciitis (Heel Spur Syndrome)  These exercises may help you when beginning to rehabilitate your injury. They may resolve your symptoms with or without further involvement from your physician, physical therapist or athletic trainer. While completing these exercises, remember:   Muscles can gain both the endurance and the strength needed for everyday activities through controlled exercises.  Complete these exercises as instructed by your physician, physical therapist or athletic trainer. Progress the resistance and repetitions only as guided. STRENGTH - Towel  Curls  Sit in a chair positioned on a non-carpeted surface.  Place your foot on a towel, keeping your heel on the floor.  Pull the towel toward your heel by only curling your toes. Keep your heel on the floor.  If instructed by your physician, physical therapist or athletic trainer, add ____________________ at the end of the towel. Repeat __________ times. Complete this exercise __________ times per day. STRENGTH - Ankle Inversion  Secure one end of a rubber exercise band/tubing to a fixed object (table, pole). Loop the other end around your foot just before your toes.  Place your fists between your knees. This will focus your strengthening at your ankle.  Slowly, pull your big toe up and in, making sure the band/tubing is positioned to resist the entire motion.  Hold this position for __________ seconds.  Have your muscles resist the band/tubing as it slowly pulls your foot back to the starting position. Repeat __________ times. Complete this exercises __________ times per day.  Document Released: 11/10/2005 Document Revised: 02/02/2012 Document Reviewed: 02/22/2009 [employer] Patient Information 2014 [employer]. Plantar Fasciitis Plantar fasciitis is a common condition that causes foot pain. It is soreness (inflammation) of the band of tough fibrous tissue on the bottom of the foot that runs from the heel bone (calcaneus) to the ball of the foot. The cause of this soreness may be from excessive standing, poor fitting shoes, running on hard surfaces, being overweight, having an abnormal walk, or overuse (this is common in runners) of the painful foot or feet. It is also common in aerobic exercise dancers and ballet dancers. SYMPTOMS  Most people with plantar fasciitis complain of:  Severe pain in the morning on the bottom of their foot especially when taking the first steps out of bed. This pain recedes after a few minutes of walking.  Severe pain is experienced also during walking  following a long period of inactivity.  Pain is worse when walking barefoot or up stairs DIAGNOSIS   Your caregiver will diagnose this condition by examining and feeling your foot.  Special tests such as X-rays of your foot, are usually not needed. PREVENTION   Consult a sports medicine professional before beginning a new exercise program.  Walking programs offer a good workout. With walking there is a lower chance of overuse injuries common to runners. There is less impact and less jarring of the joints.  Begin all new exercise programs slowly. If problems or pain develop, decrease the amount of time or distance until you are at a comfortable level.  Wear good shoes and replace them regularly.  Stretch your foot and the heel cords at the back of the ankle (Achilles tendon) both before and after exercise.  Run or exercise on even surfaces that are not hard. For example, asphalt is better than pavement.  Do not run barefoot on hard surfaces.  If using a treadmill, vary the incline.  Do not continue to workout if you have foot or joint   problems. Seek professional help if they do not improve. HOME CARE INSTRUCTIONS   Avoid activities that cause you pain until you recover.  Use ice or cold packs on the problem or painful areas after working out.  Only take over-the-counter or prescription medicines for pain, discomfort, or fever as directed by your caregiver.  Soft shoe inserts or athletic shoes with air or gel sole cushions may be helpful.  If problems continue or become more severe, consult a sports medicine caregiver or your own health care provider. Cortisone is a potent anti-inflammatory medication that may be injected into the painful area. You can discuss this treatment with your caregiver. MAKE SURE YOU:   Understand these instructions.  Will watch your condition.  Will get help right away if you are not doing well or get worse. Document Released: 08/05/2001 Document  Revised: 02/02/2012 Document Reviewed: 10/04/2008 [employer] Patient Information 2014 [employer].  

## 2013-11-15 NOTE — Progress Notes (Signed)
She presents today with a several month duration of pain to the right heel. She states it is slowly been getting worse walking seems to aggravate it. she's done nothing other than Tylenol to try to prevent her pain. She denies trauma. She denies fever chills nausea vomiting muscle aches or pains.  Objective: I have reviewed her past medical history medications allergies surgeries social history and review of systems. Vital signs are stable she is alert and oriented x3. Pulses are strongly palpable bilateral. Orthopedic evaluation demonstrates no pain on medial lateral compression of the right calcaneus however she does have pain on direct palpation of the medial continued tubercle. Radiographic evaluation does demonstrate soft tissue increase in density at the plantar fascial calcaneal insertion site of the right heel.  Assessment: Plantar fasciitis right.  Plan: Discussed the etiology pathology conservative versus surgical therapies. At this point we went ahead and reinjected the right heel. Wrote her prescription for Sterapred Dosepak to be followed by Mobic dispensed a night splint in place strapping to the right foot. I will followup with her in one month.

## 2013-12-13 ENCOUNTER — Ambulatory Visit (INDEPENDENT_AMBULATORY_CARE_PROVIDER_SITE_OTHER): Payer: 59 | Admitting: Podiatry

## 2013-12-13 ENCOUNTER — Encounter: Payer: Self-pay | Admitting: Podiatry

## 2013-12-13 VITALS — BP 100/60 | HR 99 | Resp 14

## 2013-12-13 DIAGNOSIS — M722 Plantar fascial fibromatosis: Secondary | ICD-10-CM

## 2013-12-13 NOTE — Progress Notes (Signed)
Right heel , its ok , still feel it, it may be 80- 85% better .  Objective: Vital signs are stable she is alert and oriented x3 there is no erythema edema cellulitis drainage or odor. She has pain on palpation medial continued tubercles bilateral.  Assessment: Resolving plantar fasciitis bilateral.  Plan: Injected the bilateral heels today. Plantar fascial strapping sore applied. Discussed the possible need for orthotics. I will followup with her in one month. She will continue all conservative therapies.

## 2014-01-10 ENCOUNTER — Ambulatory Visit: Payer: 59 | Admitting: Podiatry

## 2014-01-19 ENCOUNTER — Ambulatory Visit: Payer: 59 | Admitting: Podiatry

## 2014-02-06 ENCOUNTER — Other Ambulatory Visit (HOSPITAL_COMMUNITY): Payer: Self-pay | Admitting: Family Medicine

## 2014-02-06 DIAGNOSIS — Z1231 Encounter for screening mammogram for malignant neoplasm of breast: Secondary | ICD-10-CM

## 2014-02-07 ENCOUNTER — Ambulatory Visit: Payer: 59 | Admitting: Podiatry

## 2014-02-15 ENCOUNTER — Ambulatory Visit (HOSPITAL_COMMUNITY)
Admission: RE | Admit: 2014-02-15 | Discharge: 2014-02-15 | Disposition: A | Discharge status: Home / Self Care | Payer: 59 | Source: Ambulatory Visit | Attending: Family Medicine | Admitting: Family Medicine

## 2014-02-15 DIAGNOSIS — Z1231 Encounter for screening mammogram for malignant neoplasm of breast: Secondary | ICD-10-CM | POA: Insufficient documentation

## 2014-06-06 ENCOUNTER — Ambulatory Visit (INDEPENDENT_AMBULATORY_CARE_PROVIDER_SITE_OTHER): Payer: 59 | Admitting: Family Medicine

## 2014-06-06 VITALS — BP 112/84 | HR 83 | Temp 98.1°F | Resp 18 | Ht 64.0 in | Wt 287.0 lb

## 2014-06-06 DIAGNOSIS — R21 Rash and other nonspecific skin eruption: Secondary | ICD-10-CM

## 2014-06-06 DIAGNOSIS — H109 Unspecified conjunctivitis: Secondary | ICD-10-CM

## 2014-06-06 DIAGNOSIS — Z131 Encounter for screening for diabetes mellitus: Secondary | ICD-10-CM

## 2014-06-06 DIAGNOSIS — R0982 Postnasal drip: Secondary | ICD-10-CM

## 2014-06-06 DIAGNOSIS — B009 Herpesviral infection, unspecified: Secondary | ICD-10-CM

## 2014-06-06 LAB — COMPLETE METABOLIC PANEL WITH GFR
ALT: 24 U/L (ref 0–35)
AST: 19 U/L (ref 0–37)
Albumin: 3.8 g/dL (ref 3.5–5.2)
Alkaline Phosphatase: 106 U/L (ref 39–117)
Chloride: 99 mEq/L (ref 96–112)
GFR, Est African American: >89 mL/min
Potassium: 4.1 mEq/L (ref 3.5–5.3)
Sodium: 140 mEq/L (ref 135–145)
Total Protein: 7.5 g/dL (ref 6.0–8.3)

## 2014-06-06 LAB — COMPLETE METABOLIC PANEL WITHOUT GFR
BUN: 15 mg/dL (ref 6–23)
CO2: 31 meq/L (ref 19–32)
Calcium: 9.5 mg/dL (ref 8.4–10.5)
Creat: 0.7 mg/dL (ref 0.50–1.10)
GFR, Est Non African American: >89 mL/min
Glucose, Bld: 101 mg/dL — ABNORMAL HIGH (ref 70–99)
Total Bilirubin: 0.4 mg/dL (ref 0.2–1.2)

## 2014-06-06 LAB — POCT GLYCOSYLATED HEMOGLOBIN (HGB A1C): Hemoglobin A1C: 6.1

## 2014-06-06 MED ORDER — DOXYCYCLINE HYCLATE 100 MG PO TABS: 100.0000 mg | ORAL_TABLET | Freq: Two times a day (BID) | ORAL | Status: DC

## 2014-06-06 MED ORDER — FLUCONAZOLE 150 MG PO TABS: 150.0000 mg | ORAL_TABLET | Freq: Once | ORAL | Status: DC

## 2014-06-06 MED ORDER — VALACYCLOVIR HCL 500 MG PO TABS: 500.0000 mg | ORAL_TABLET | Freq: Every day | ORAL | Status: DC

## 2014-06-06 NOTE — Progress Notes (Signed)
Chief Complaint:  Chief Complaint  Patient presents with  . Abscess    Groin, X March, becoming painful  . Sinus Congestion    X 2 weeks  . Eye Irritation    X 1 week, right    HPI: [name] [name] is a 53 y.o. female who is here for : 1. Boils in her private area since March, will drain and heal and then drain and heal, has increased fritction when this occurs. 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 No fevers or chills or expanded redness.  2. She feels she has a nagging cough x 2 weeks, has tenderness aroiund her nasal area, ahs clear dc 3. Feels she may have a touch of the pink eye her right eye, was slightly red and runny , had clear draiange.  In  Past Medical History  Diagnosis Date  . Hypertension    Past Surgical History  Procedure Laterality Date  . Breast surgery    . Joint replacement     History   Social History  . Marital Status: Married    Spouse Name: N/A    Number of Children: N/A  . Years of Education: N/A   Social History Main Topics  . Smoking status: Never Smoker   . Smokeless tobacco: None  . Alcohol Use: No  . Drug Use: No  . Sexual Activity: Yes    Birth Control/ Protection: None   Other Topics Concern  . None   Social History Narrative  . None   History reviewed. No pertinent family history. No Known Allergies Prior to Admission medications   Medication Sig Start Date End Date Taking? Authorizing Provider  quinapril-hydrochlorothiazide (ACCURETIC) 20-12.5 MG per tablet Take 1 tablet by mouth daily.   Yes Historical Provider, MD  meloxicam (MOBIC) 15 MG tablet Take 1 tablet (15 mg total) by mouth daily. 11/15/13   Max T Hyatt, DPM  methylPREDNIsolone (MEDROL DOSPACK) 4 MG tablet follow package directions 11/15/13   Max T  Hyatt, DPM     ROS: The patient denies fevers, chills, night sweats, unintentional weight loss, chest pain, palpitations, wheezing, dyspnea on exertion, nausea, vomiting, abdominal pain, dysuria, hematuria, melena, numbness, weakness, or tingling.   All other systems have been reviewed and were otherwise negative with the exception of those mentioned in the HPI and as above.    PHYSICAL EXAM: Filed Vitals:   06/06/14 0818  BP: 112/84  Pulse: 83  Temp: 98.1 F (36.7 C)  Resp: 18   Filed Vitals:   06/06/14 0818  Height: 5\' 4"  (1.626 m)  Weight: 287 lb (130.182 kg)   Body mass index is 49.24 kg/(m^2).  General: Alert, no acute distress HEENT:  Normocephalic, atraumatic, oropharynx patent. EOMI, PERRLA Cardiovascular:  Regular rate and rhythm, no rubs murmurs or gallops.  No Carotid bruits, radial pulse intact. No pedal edema.  Respiratory: Clear to auscultation bilaterally.  No wheezes, rales, or rhonchi.  No cyanosis, no use of accessory musculature GI: No organomegaly, abdomen is soft and non-tender, positive bowel sounds.  No masses. Skin: + boils but she has ulcers c/w HSV as well, they are tender Neurologic: Facial musculature symmetric. Psychiatric: Patient is appropriate throughout our interaction. Lymphatic: No cervical lymphadenopathy Musculoskeletal: Gait intact.  LABS: Results for orders placed in visit on 06/06/14  POCT GLYCOSYLATED HEMOGLOBIN (HGB A1C)      Result Value Ref Range   Hemoglobin A1C 6.1       EKG/XRAY:   Primary read interpreted by Dr. Marin Comment at [facility].   ASSESSMENT/PLAN: Encounter Diagnoses  Name Primary?  . Screening for diabetes mellitus Yes  . Rash and nonspecific skin eruption   . PND (post-nasal drip)   . Conjunctivitis unspecified    Doxycycline 100 mg BID x 10 days for boils Valtrex 500 mg daily for HSV ppx OTC nasacort, zatidor F/u prn     Gross sideeffects, risk and benefits, and alternatives of medications d/w patient.  Patient is aware that all medications have potential sideeffects and we are unable to predict every sideeffect or drug-drug interaction that may occur.  Lovella Hardie, [place], DO 06/06/2014 9:12 AM

## 2014-06-06 NOTE — Patient Instructions (Signed)
OTC nasacort  1 spray in each nose dailu OTC zatidor or ophcan antihistamine eye drops twice a day as needed Take Doxycycline 100 mg twice a day for 10 days, once doen with doxycyline take Valtrex 500 mg

## 2014-07-26 ENCOUNTER — Emergency Department (HOSPITAL_COMMUNITY): Payer: 59

## 2014-07-26 ENCOUNTER — Emergency Department (HOSPITAL_COMMUNITY)
Admission: EM | Admit: 2014-07-26 | Discharge: 2014-07-26 | Disposition: A | Discharge status: Home / Self Care | Payer: 59 | Attending: Emergency Medicine | Admitting: Emergency Medicine

## 2014-07-26 ENCOUNTER — Encounter (HOSPITAL_COMMUNITY): Payer: Self-pay | Admitting: Emergency Medicine

## 2014-07-26 DIAGNOSIS — I447 Left bundle-branch block, unspecified: Secondary | ICD-10-CM | POA: Diagnosis not present

## 2014-07-26 DIAGNOSIS — M546 Pain in thoracic spine: Secondary | ICD-10-CM

## 2014-07-26 DIAGNOSIS — R079 Chest pain, unspecified: Secondary | ICD-10-CM | POA: Insufficient documentation

## 2014-07-26 DIAGNOSIS — Z791 Long term (current) use of non-steroidal anti-inflammatories (NSAID): Secondary | ICD-10-CM | POA: Diagnosis not present

## 2014-07-26 DIAGNOSIS — I10 Essential (primary) hypertension: Secondary | ICD-10-CM | POA: Insufficient documentation

## 2014-07-26 DIAGNOSIS — Z79899 Other long term (current) drug therapy: Secondary | ICD-10-CM | POA: Diagnosis not present

## 2014-07-26 DIAGNOSIS — Z792 Long term (current) use of antibiotics: Secondary | ICD-10-CM | POA: Insufficient documentation

## 2014-07-26 LAB — CBC WITH DIFFERENTIAL/PLATELET
Basophils Absolute: 0 10*3/uL (ref 0.0–0.1)
Basophils Relative: 0 % (ref 0–1)
Eosinophils Absolute: 0.1 10*3/uL (ref 0.0–0.7)
Eosinophils Relative: 1 % (ref 0–5)
HCT: 38.7 % (ref 36.0–46.0)
Hemoglobin: 12.6 g/dL (ref 12.0–15.0)
LYMPHS ABS: 2 10*3/uL (ref 0.7–4.0)
LYMPHS PCT: 28 % (ref 12–46)
MCH: 28.1 pg (ref 26.0–34.0)
MCHC: 32.6 g/dL (ref 30.0–36.0)
MCV: 86.2 fL (ref 78.0–100.0)
Monocytes Absolute: 0.5 10*3/uL (ref 0.1–1.0)
Monocytes Relative: 7 % (ref 3–12)
NEUTROS PCT: 64 % (ref 43–77)
Neutro Abs: 4.4 10*3/uL (ref 1.7–7.7)
Platelets: 267 10*3/uL (ref 150–400)
RBC: 4.49 MIL/uL (ref 3.87–5.11)
RDW: 14.5 % (ref 11.5–15.5)
WBC: 7 10*3/uL (ref 4.0–10.5)

## 2014-07-26 LAB — I-STAT TROPONIN, ED: TROPONIN I, POC: 0 ng/mL (ref 0.00–0.08)

## 2014-07-26 LAB — BASIC METABOLIC PANEL
ANION GAP: 10 (ref 5–15)
BUN: 14 mg/dL (ref 6–23)
CO2: 30 meq/L (ref 19–32)
Calcium: 9.3 mg/dL (ref 8.4–10.5)
Chloride: 100 mEq/L (ref 96–112)
Creatinine, Ser: 0.65 mg/dL (ref 0.50–1.10)
GFR calc Af Amer: >90 mL/min (ref >90)
GLUCOSE: 101 mg/dL — AB (ref 70–99)
Potassium: 3.1 mEq/L — ABNORMAL LOW (ref 3.7–5.3)
SODIUM: 140 meq/L (ref 137–147)

## 2014-07-26 MED ORDER — OXYCODONE-ACETAMINOPHEN 5-325 MG PO TABS: 1.0000 | ORAL_TABLET | Freq: Four times a day (QID) | ORAL | Status: DC | PRN

## 2014-07-26 MED ORDER — OXYCODONE-ACETAMINOPHEN 5-325 MG PO TABS
2.0000 | ORAL_TABLET | Freq: Once | ORAL | Status: AC
  Administered 2014-07-26: 2 via ORAL
  Filled 2014-07-26: qty 2

## 2014-07-26 NOTE — Discharge Instructions (Signed)
Back Pain, Adult Low back pain is very common. About 1 in 5 people have back pain.The cause of low back pain is rarely dangerous. The pain often gets better over time.About half of people with a sudden onset of back pain feel better in just 2 weeks. About 8 in 10 people feel better by 6 weeks.  CAUSES Some common causes of back pain include:  Strain of the muscles or ligaments supporting the spine.  Wear and tear (degeneration) of the spinal discs.  Arthritis.  Direct injury to the back. DIAGNOSIS Most of the time, the direct cause of low back pain is not known.However, back pain can be treated effectively even when the exact cause of the pain is unknown.Answering your caregiver's questions about your overall health and symptoms is one of the most accurate ways to make sure the cause of your pain is not dangerous. If your caregiver needs more information, he or she may order lab work or imaging tests (X-rays or MRIs).However, even if imaging tests show changes in your back, this usually does not require surgery. HOME CARE INSTRUCTIONS For many people, back pain returns.Since low back pain is rarely dangerous, it is often a condition that people can learn to manageon their own.   Remain active. It is stressful on the back to sit or stand in one place. Do not sit, drive, or stand in one place for more than 30 minutes at a time. Take short walks on level surfaces as soon as pain allows.Try to increase the length of time you walk each day.  Do not stay in bed.Resting more than 1 or 2 days can delay your recovery.  Do not avoid exercise or work.Your body is made to move.It is not dangerous to be active, even though your back may hurt.Your back will likely heal faster if you return to being active before your pain is gone.  Pay attention to your body when you bend and lift. Many people have less discomfortwhen lifting if they bend their knees, keep the load close to their bodies,and  avoid twisting. Often, the most comfortable positions are those that put less stress on your recovering back.  Find a comfortable position to sleep. Use a firm mattress and lie on your side with your knees slightly bent. If you lie on your back, put a pillow under your knees.  Only take over-the-counter or prescription medicines as directed by your caregiver. Over-the-counter medicines to reduce pain and inflammation are often the most helpful.Your caregiver may prescribe muscle relaxant drugs.These medicines help dull your pain so you can more quickly return to your normal activities and healthy exercise.  Put ice on the injured area.  Put ice in a plastic bag.  Place a towel between your skin and the bag.  Leave the ice on for 15-20 minutes, 03-04 times a day for the first 2 to 3 days. After that, ice and heat may be alternated to reduce pain and spasms.  Ask your caregiver about trying back exercises and gentle massage. This may be of some benefit.  Avoid feeling anxious or stressed.Stress increases muscle tension and can worsen back pain.It is important to recognize when you are anxious or stressed and learn ways to manage it.Exercise is a great option. SEEK MEDICAL CARE IF:  You have pain that is not relieved with rest or medicine.  You have pain that does not improve in 1 week.  You have new symptoms.  You are generally not feeling well. SEEK   IMMEDIATE MEDICAL CARE IF:   You have pain that radiates from your back into your legs.  You develop new bowel or bladder control problems.  You have unusual weakness or numbness in your arms or legs.  You develop nausea or vomiting.  You develop abdominal pain.  You feel faint. Document Released: 11/10/2005 Document Revised: 05/11/2012 Document Reviewed: 03/14/2014 Exit[employer] Patient Information 2015 [employer]. This information is not intended to replace advice given to you by your health care provider. Make sure you  discuss any questions you have with your health care provider.  

## 2014-07-26 NOTE — ED Notes (Signed)
Pt reports having pain and possibly muscle spasms to right shoulder, neck, back and into right side of chest. Went to an ucc and sent here due to abnormal ekg.

## 2014-07-26 NOTE — ED Provider Notes (Addendum)
CSN: [id number]     Arrival date & time 07/26/14  1033 History   First MD Initiated Contact with Patient 07/26/14 1046     Chief Complaint  Patient presents with  . Chest Pain     (Consider location/radiation/quality/duration/timing/severity/associated sxs/prior Treatment) Patient is a 53 y.o. female presenting with back pain. The history is provided by the patient. No language interpreter was used.  Back Pain Location:  Thoracic spine Quality:  Stabbing Radiates to:  R shoulder (R chest) Pain severity:  Severe Pain is:  Same all the time Onset quality:  Gradual Duration:  3 days Timing: was intermittent, now constant. Progression:  Worsening Chronicity:  New Context: emotional stress   Context: not falling, not lifting heavy objects, not MCA, not MVA, not pedestrian accident, not physical stress, not recent illness and not twisting   Relieved by:  Being still Worsened by:  Movement Ineffective treatments:  Muscle relaxants Associated symptoms: chest pain   Associated symptoms: no abdominal pain, no abdominal swelling, no bladder incontinence, no bowel incontinence, no dysuria, no fever, no headaches, no leg pain, no numbness, no paresthesias, no pelvic pain, no tingling, no weakness and no weight loss   Associated symptoms comment:  No assoc, cough, SOB, leg swelling or pain  Chest pain:    Quality:  Sharp (coming from back)   Past Medical History  Diagnosis Date  . Hypertension    Past Surgical History  Procedure Laterality Date  . Breast surgery    . Joint replacement     History reviewed. No pertinent family history. History  Substance Use Topics  . Smoking status: Never Smoker   . Smokeless tobacco: Not on file  . Alcohol Use: No   OB History   Grav Para Term Preterm Abortions TAB SAB Ect Mult Living                 Review of Systems  Constitutional: Negative for fever, chills, weight loss, diaphoresis, activity change, appetite change and fatigue.  HENT:  Negative for congestion, facial swelling, rhinorrhea and sore throat.   Eyes: Negative for photophobia and discharge.  Respiratory: Negative for cough, chest tightness and shortness of breath.   Cardiovascular: Positive for chest pain. Negative for palpitations and leg swelling.  Gastrointestinal: Negative for nausea, vomiting, abdominal pain, diarrhea and bowel incontinence.  Endocrine: Negative for polydipsia and polyuria.  Genitourinary: Negative for bladder incontinence, dysuria, frequency, difficulty urinating and pelvic pain.  Musculoskeletal: Positive for back pain. Negative for arthralgias, neck pain and neck stiffness.  Skin: Negative for color change and wound.  Allergic/Immunologic: Negative for immunocompromised state.  Neurological: Negative for tingling, facial asymmetry, weakness, numbness, headaches and paresthesias.  Hematological: Does not bruise/bleed easily.  Psychiatric/Behavioral: Negative for confusion and agitation.      Allergies  Review of patient's allergies indicates no known allergies.  Home Medications   Prior to Admission medications   Medication Sig Start Date End Date Taking? Authorizing Provider  cyclobenzaprine (FLEXERIL) 10 MG tablet Take 10 mg by mouth daily as needed for muscle spasms.  07/05/14  Yes Historical Provider, MD  metFORMIN (GLUCOPHAGE) 500 MG tablet Take 500 mg by mouth daily. 07/06/14  Yes Historical Provider, MD  quinapril-hydrochlorothiazide (ACCURETIC) 20-12.5 MG per tablet Take 1 tablet by mouth daily.   Yes Historical Provider, MD  oxyCODONE-acetaminophen (PERCOCET) 5-325 MG per tablet Take 1 tablet by mouth every 6 (six) hours as needed. 07/26/14   Ernestina Patches, MD   BP 126/64  Pulse 83  Temp(Src) 98 F (36.7 C) (Oral)  Resp 14  SpO2 99% Physical Exam  Constitutional: She is oriented to person, place, and time. She appears well-developed and well-nourished. No distress.  HENT:  Head: Normocephalic and atraumatic.    Mouth/Throat: No oropharyngeal exudate.  Eyes: Pupils are equal, round, and reactive to light.  Neck: Muscular tenderness present. No spinous process tenderness present. Decreased range of motion (due to pain) present. No edema and no erythema present.    Cardiovascular: Normal rate, regular rhythm and normal heart sounds.  Exam reveals no gallop and no friction rub.   No murmur heard. Pulmonary/Chest: Effort normal and breath sounds normal. No respiratory distress. She has no wheezes. She has no rales.    Abdominal: Soft. Bowel sounds are normal. She exhibits no distension and no mass. There is no tenderness. There is no rebound and no guarding.  Musculoskeletal: She exhibits no edema and no tenderness.  Neurological: She is alert and oriented to person, place, and time. She has normal strength. She displays no tremor. No cranial nerve deficit or sensory deficit. She exhibits normal muscle tone. Coordination normal. GCS eye subscore is 4. GCS verbal subscore is 5. GCS motor subscore is 6.  Skin: Skin is warm and dry.  Psychiatric: She has a normal mood and affect.    ED Course  Procedures (including critical care time) Labs Review Labs Reviewed  BASIC METABOLIC PANEL - Abnormal; Notable for the following:    Potassium 3.1 (*)    Glucose, Bld 101 (*)    All other components within normal limits  CBC WITH DIFFERENTIAL  I-STAT TROPOININ, ED    Imaging Review Dg Chest 2 View  07/26/2014   CLINICAL DATA:  Chest pain for 3 days  EXAM: CHEST  2 VIEW  COMPARISON:  09/27/2013  FINDINGS: No cardiomegaly when accounting for increase in lower mediastinal fat on previous CT imaging. Aortic contours are negative. There is no edema, consolidation, effusion, or pneumothorax. No osseous findings to explain chest pain.  IMPRESSION: No active cardiopulmonary disease.   Electronically Signed   By: Jorje Guild M.D.   On: 07/26/2014 12:26     EKG Interpretation   Date/Time:  Wednesday July 26 2014 10:37:12 EDT Ventricular Rate:  92 PR Interval:  168 QRS Duration: 122 QT Interval:  380 QTC Calculation: 469 R Axis:   24 Text Interpretation:  Normal sinus rhythm Left bundle branch block  Abnormal ECG No significant change since last tracing on 4/21/'13 except  rate improved Confirmed by DOCHERTY  MD, MEGAN ([id number]) on 07/26/2014 10:49:46  AM      MDM   Final diagnoses:  Right-sided thoracic back pain  LBBB (left bundle branch block)   Pt is a 53 y.o. female with Pmhx as above who presents with sharp stabbing pain to upper right back radiating to right side of neck, right shoulder and around to right-sided chest. Patient sent from urgent care after she had an EKG with a left bundle branch block, without prior EKGs for comparison. On physical exam vital signs are stable and patient is in no acute distress she is reproducible pain as above is to the right steer back and right neck. She has no reproducible chest wall pain. She has decreased range of motion in neck pain. EKG here unchanged from prior from April 2013. CBC, BMP, i-STAT troponin nml.  Chest x-ray nml. Doubt acute coronary syndrome. Suspect this pain is due to muscle strain/spasm. We'll recommend supportive care with  scheduled NSAIDs continued really use of muscle relaxers. Return precautions given for new or worsening symptoms including worsening pain, trouble breathing, increased leg pain or swelling, fevers. She can otherwise followup with her PCP Dr. Justin Mend.         Ernestina Patches, MD 07/26/14 [place], MD 07/26/14 1311

## 2014-08-03 ENCOUNTER — Ambulatory Visit: Payer: 59 | Attending: Family Medicine

## 2014-08-03 DIAGNOSIS — M542 Cervicalgia: Secondary | ICD-10-CM | POA: Insufficient documentation

## 2014-08-03 DIAGNOSIS — IMO0001 Reserved for inherently not codable concepts without codable children: Secondary | ICD-10-CM | POA: Insufficient documentation

## 2014-08-03 DIAGNOSIS — M62838 Other muscle spasm: Secondary | ICD-10-CM | POA: Insufficient documentation

## 2014-08-03 DIAGNOSIS — Z9889 Other specified postprocedural states: Secondary | ICD-10-CM | POA: Diagnosis not present

## 2014-08-03 DIAGNOSIS — K5090 Crohn's disease, unspecified, without complications: Secondary | ICD-10-CM | POA: Diagnosis not present

## 2014-08-03 DIAGNOSIS — M25519 Pain in unspecified shoulder: Secondary | ICD-10-CM | POA: Diagnosis not present

## 2014-08-03 DIAGNOSIS — I10 Essential (primary) hypertension: Secondary | ICD-10-CM | POA: Diagnosis not present

## 2014-08-03 DIAGNOSIS — R5381 Other malaise: Secondary | ICD-10-CM | POA: Insufficient documentation

## 2014-08-07 ENCOUNTER — Ambulatory Visit: Payer: 59

## 2014-08-07 DIAGNOSIS — IMO0001 Reserved for inherently not codable concepts without codable children: Secondary | ICD-10-CM | POA: Diagnosis not present

## 2014-08-08 ENCOUNTER — Ambulatory Visit: Payer: 59

## 2014-08-08 DIAGNOSIS — IMO0001 Reserved for inherently not codable concepts without codable children: Secondary | ICD-10-CM | POA: Diagnosis not present

## 2014-08-14 ENCOUNTER — Ambulatory Visit: Payer: 59 | Admitting: Physical Therapy

## 2014-08-16 ENCOUNTER — Encounter: Payer: 59 | Admitting: Physical Therapy

## 2014-08-21 ENCOUNTER — Encounter: Payer: 59 | Admitting: Physical Therapy

## 2014-08-23 ENCOUNTER — Encounter: Payer: 59 | Admitting: Physical Therapy

## 2014-08-28 ENCOUNTER — Encounter: Payer: 59 | Admitting: Physical Therapy

## 2014-09-04 ENCOUNTER — Encounter: Payer: 59 | Admitting: Physical Therapy

## 2014-09-06 ENCOUNTER — Encounter: Payer: 59 | Admitting: Physical Therapy

## 2014-09-11 ENCOUNTER — Encounter: Payer: 59 | Admitting: Physical Therapy

## 2014-09-13 ENCOUNTER — Encounter: Payer: 59 | Admitting: Physical Therapy

## 2014-11-23 ENCOUNTER — Ambulatory Visit (INDEPENDENT_AMBULATORY_CARE_PROVIDER_SITE_OTHER): Payer: 59 | Admitting: Family Medicine

## 2014-11-23 VITALS — BP 104/80 | HR 105 | Temp 98.2°F | Resp 18 | Ht 63.5 in | Wt 267.2 lb

## 2014-11-23 DIAGNOSIS — R5383 Other fatigue: Secondary | ICD-10-CM

## 2014-11-23 DIAGNOSIS — Z880 Allergy status to penicillin: Secondary | ICD-10-CM

## 2014-11-23 DIAGNOSIS — R109 Unspecified abdominal pain: Secondary | ICD-10-CM

## 2014-11-23 DIAGNOSIS — T7840XA Allergy, unspecified, initial encounter: Secondary | ICD-10-CM

## 2014-11-23 LAB — POCT CBC
Granulocyte percent: 66.3 %G (ref 37–80)
HEMATOCRIT: 40.3 % (ref 37.7–47.9)
HEMOGLOBIN: 13 g/dL (ref 12.2–16.2)
Lymph, poc: 2.2 (ref 0.6–3.4)
MCH: 28.2 pg (ref 27–31.2)
MCHC: 32.3 g/dL (ref 31.8–35.4)
MCV: 87.2 fL (ref 80–97)
MID (cbc): 0.4 (ref 0–0.9)
MPV: 9 fL (ref 0–99.8)
POC Granulocyte: 5.2 (ref 2–6.9)
POC LYMPH PERCENT: 28.3 %L (ref 10–50)
POC MID %: 5.4 % (ref 0–12)
Platelet Count, POC: 264 10*3/uL (ref 142–424)
RBC: 4.61 M/uL (ref 4.04–5.48)
RDW, POC: 14.9 %
WBC: 7.8 10*3/uL (ref 4.6–10.2)

## 2014-11-23 LAB — POCT URINALYSIS DIPSTICK
Glucose, UA: NEGATIVE
Ketones, UA: NEGATIVE
Leukocytes, UA: NEGATIVE
NITRITE UA: NEGATIVE
Protein, UA: 100
SPEC GRAV UA: 1.02
UROBILINOGEN UA: 1
pH, UA: 6

## 2014-11-23 LAB — POCT UA - MICROSCOPIC ONLY
CRYSTALS, UR, HPF, POC: NEGATIVE
Casts, Ur, LPF, POC: NEGATIVE
Yeast, UA: NEGATIVE

## 2014-11-23 LAB — GLUCOSE, POCT (MANUAL RESULT ENTRY): POC GLUCOSE: 125 mg/dL — AB (ref 70–99)

## 2014-11-23 LAB — POCT SEDIMENTATION RATE: POCT SED RATE: 75 mm/hr — AB (ref 0–22)

## 2014-11-23 MED ORDER — PREDNISONE 20 MG PO TABS: ORAL_TABLET | ORAL | Status: DC

## 2014-11-23 NOTE — Patient Instructions (Addendum)
Take over-the-counter Claritin (loratadine) one pill daily  Take prednisone 20 mg 3 pills today, 2 tomorrow, and 1 Saturday.  It probably would be good for you to try to continue pushing yourself a little bit to keep going so your body is fatigued enough that you do sleep at nighttime.  If you continue to just does not feel good come back.  Always avoid all penicillins.  Take aleve 2 pills twice daily if needed for flank panin

## 2014-11-23 NOTE — Progress Notes (Signed)
Subjective: 53 year old lady who is here with a history of having a rash on her face. Apparently she had a little extractions done about 9 days ago. She was treated with penicillin. She was told to 4 pills per se to the next. She felt a little bad after the first 4 pills a day after taking to that she was itching and swelling in her face. Some itching on her hands. She says she is allergic to amoxicillin in the past. She has persisted in feeling bad for the last week. She did stop her medication. She Tried to wait till Tuesday till her follow-up appointment. 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 She has continued to feel very fatigued. She has pain in her flank on the left. She has not had any other rashes, but is POC skin on the face.  Objective: Obese lady with puffy cheeks. Some peeling skin on her cheeks. Throat clear. Site of dental extraction appears to be fairly normal it's healing on the right upper gum margin. Her neck is supple without significant nodes. Chest clear to auscultation. Heart regular without any murmurs. Abdomen soft nontender. She has mild left CVA tenderness. Extremities without edema.  Assessment: Allergic reaction Fatigue Flank pain History of possible diabetes Hypertension  Plan: Check labs.  Results for orders placed or performed in visit on 11/23/14  POCT CBC  Result Value Ref Range   WBC 7.8 4.6 - 10.2 K/uL   Lymph, poc 2.2 0.6 - 3.4   POC LYMPH PERCENT 28.3 10 - 50 %L   MID (cbc) 0.4 0 - 0.9   POC MID % 5.4 0 - 12 %M   POC Granulocyte 5.2 2 - 6.9   Granulocyte percent 66.3 37 - 80 %G   RBC 4.61 4.04 - 5.48 M/uL   Hemoglobin 13.0 12.2 - 16.2 g/dL   HCT, POC 40.3 37.7 - 47.9 %   MCV 87.2 80 - 97 fL   MCH, POC 28.2 27 - 31.2 pg   MCHC 32.3 31.8 - 35.4 g/dL   RDW, POC 14.9 %   Platelet Count, POC 264 142 - 424 K/uL   MPV 9.0 0 - 99.8 fL  POCT UA - Microscopic Only  Result Value Ref Range   WBC, Ur, HPF, POC 2-4    RBC,  urine, microscopic 3-6    Bacteria, U Microscopic 2+    Mucus, UA 1+    Epithelial cells, urine per micros 5-10    Crystals, Ur, HPF, POC neg    Casts, Ur, LPF, POC neg    Yeast, UA neg   POCT glucose (manual entry)  Result Value Ref Range   POC Glucose 125 (A) 70 - 99 mg/dl  POCT urinalysis dipstick  Result Value Ref Range   Color, UA yellow    Clarity, UA cloudy    Glucose, UA neg    Bilirubin, UA small    Ketones, UA neg    Spec Grav, UA 1.020    Blood, UA moderate    pH, UA 6.0    Protein, UA 100    Urobilinogen, UA 1.0    Nitrite, UA neg    Leukocytes, UA Negative

## 2014-11-28 ENCOUNTER — Ambulatory Visit (INDEPENDENT_AMBULATORY_CARE_PROVIDER_SITE_OTHER): Payer: 59 | Admitting: Family Medicine

## 2014-11-28 ENCOUNTER — Ambulatory Visit (INDEPENDENT_AMBULATORY_CARE_PROVIDER_SITE_OTHER): Payer: Self-pay

## 2014-11-28 VITALS — BP 130/82 | HR 64 | Temp 97.9°F | Resp 20 | Ht 63.5 in | Wt 270.0 lb

## 2014-11-28 DIAGNOSIS — M545 Low back pain, unspecified: Secondary | ICD-10-CM

## 2014-11-28 DIAGNOSIS — M5417 Radiculopathy, lumbosacral region: Secondary | ICD-10-CM

## 2014-11-28 DIAGNOSIS — R109 Unspecified abdominal pain: Secondary | ICD-10-CM

## 2014-11-28 DIAGNOSIS — N390 Urinary tract infection, site not specified: Secondary | ICD-10-CM

## 2014-11-28 DIAGNOSIS — R350 Frequency of micturition: Secondary | ICD-10-CM

## 2014-11-28 LAB — POCT URINALYSIS DIPSTICK
Bilirubin, UA: NEGATIVE
Glucose, UA: NEGATIVE
Ketones, UA: NEGATIVE
Nitrite, UA: NEGATIVE
Protein, UA: NEGATIVE
Spec Grav, UA: 1.02
Urobilinogen, UA: 0.2
pH, UA: 7

## 2014-11-28 LAB — POCT UA - MICROSCOPIC ONLY
Casts, Ur, LPF, POC: NEGATIVE
Crystals, Ur, HPF, POC: NEGATIVE
Mucus, UA: NEGATIVE
Yeast, UA: NEGATIVE

## 2014-11-28 MED ORDER — CYCLOBENZAPRINE HCL 10 MG PO TABS: ORAL_TABLET | ORAL | Status: DC

## 2014-11-28 MED ORDER — NITROFURANTOIN MONOHYD MACRO 100 MG PO CAPS: 100.0000 mg | ORAL_CAPSULE | Freq: Two times a day (BID) | ORAL | Status: DC

## 2014-11-28 MED ORDER — MELOXICAM 7.5 MG PO TABS: ORAL_TABLET | ORAL | Status: DC

## 2014-11-28 MED ORDER — OXYCODONE-ACETAMINOPHEN 5-325 MG PO TABS: 1.0000 | ORAL_TABLET | Freq: Three times a day (TID) | ORAL | Status: DC | PRN

## 2014-11-28 NOTE — Progress Notes (Signed)
Chief Complaint:  Chief Complaint  Patient presents with  . Back Pain    Left lower back pain- radiates down leg x2 weeks. Unable to sleep     HPI: [name] [name] is a 54 y.o. female who is here for 2 week history of low back pain after she thinks she slept wrong, she has NKI, she sits a lot at work. She ahs atried sleepign with pillow in between legs but no improvement but better when pillow underneath her knees. She has no numbness or tinglign but had something similar. SHe has strong sharp pain, spasm , she ahs 5/10 pain, 9/10 pain worse wihen she is trying to sleep. SHe has tried pillow in between her legs and under her legs. Has tried otc meds without releif.   Prior MRI of L spine shows the following:  Clinical Data: Thoracolumbar spine pain, left-sided primarily. MRI OF THORACIC SPINE WITHOUT CONTRAST - 03/12/06: Technique: Multiplanar and multiecho pulse sequences of the thoracic spine were obtained according to standard protocol without IV contrast. Numbering of the thoracic spine was accomplished by counting down from the dens. This is correlated with the numbering in the lumbar spine by counting up from the sacrum. Findings: There is no evidence for disk degeneration, disk herniation, vertebral body abnormality, or paraspinous mass. The cord has a normal size and signal throughout. There is minimal prominence of the central canal opposite T7 and T8 which is felt to be physiologic. IMPRESSION:  Negative MRI of the thoracic spine. MRI LUMBAR SPINE WITHOUT CONTRAST - 03/12/06: Technique: Multiplanar and multiecho pulse sequences of the lumbar spine, to include the lower thoracic and upper sacral regions, were obtained according to standard protocol without IV contrast. Findings: There is mild disk desiccation at L5-S1. No disk protrusion, spinal stenosis, or vertebral body abnormality. No pre- or paravertebral masses. There is mild lower lumbar facet  arthropathy, worst at L4-5 and L5-S1. Far right and left parasagittal images demonstrate no foraminal narrowing. IMPRESSION:  1. Mild disk desiccation at L5-S1 without frank disk protrusion, spinal stenosis, or nerve root encroachment.  2. Lower lumbar facet arthropathy without significant lateral recess encroachment or foraminal narrowing.    Provider: Jaye Beagle  Past Medical History  Diagnosis Date  . Hypertension    Past Surgical History  Procedure Laterality Date  . Breast surgery    . Joint replacement     History   Social History  . Marital Status: Married    Spouse Name: N/A    Number of Children: N/A  . Years of Education: N/A   Social History Main Topics  . Smoking status: Never Smoker   . Smokeless tobacco: None  . Alcohol Use: No  . Drug Use: No  . Sexual Activity: Yes    Birth Control/ Protection: None   Other Topics Concern  . None   Social History Narrative   History reviewed. No pertinent family history. Allergies  Allergen Reactions  . Penicillins Hives and Shortness Of Breath   Prior to Admission medications   Medication Sig Start Date End Date Taking? Authorizing Provider  quinapril-hydrochlorothiazide (ACCURETIC) 20-12.5 MG per tablet Take 1 tablet by mouth daily.   Yes Historical Provider, MD  cyclobenzaprine (FLEXERIL) 10 MG tablet Take 10 mg by mouth daily as needed for muscle spasms.  07/05/14   Historical Provider, MD  metFORMIN (GLUCOPHAGE) 500 MG tablet Take 500 mg by mouth daily. 07/06/14   Historical Provider, MD  oxyCODONE-acetaminophen (PERCOCET) 5-325 MG per  tablet Take 1 tablet by mouth every 6 (six) hours as needed. Patient not taking: Reported on 11/23/2014 07/26/14   Ernestina Patches, MD     ROS: The patient denies fevers, chills, night sweats, unintentional weight loss, chest pain, palpitations, wheezing, dyspnea on exertion, nausea, vomiting, abdominal pain, dysuria, hematuria, melena, numbness, weakness, or  tingling. + increase urianry frequency  All other systems have been reviewed and were otherwise negative with the exception of those mentioned in the HPI and as above.    PHYSICAL EXAM: Filed Vitals:   11/28/14 0906  BP: 130/82  Pulse: 64  Temp: 97.9 F (36.6 C)  Resp: 20   Filed Vitals:   11/28/14 0906  Height: 5' 3.5" (1.613 m)  Weight: 270 lb (122.471 kg)   Body mass index is 47.07 kg/(m^2).  General: Alert, no acute distress HEENT:  Normocephalic, atraumatic, oropharynx patent. EOMI, PERRLA Cardiovascular:  Regular rate and rhythm, no rubs murmurs or gallops.  No Carotid bruits, radial pulse intact. No pedal edema.  Respiratory: Clear to auscultation bilaterally.  No wheezes, rales, or rhonchi.  No cyanosis, no use of accessory musculature GI: No organomegaly, abdomen is soft and non-tender, positive bowel sounds.  No masses. Skin: No rashes. Neurologic: Facial musculature symmetric. Psychiatric: Patient is appropriate throughout our interaction. Lymphatic: No cervical lymphadenopathy Musculoskeletal: Gait intact. + paramsk tenderness specifically tender at SI jt on palpation Full ROM in flexion but she has pain 5/5 strength, 2/2 DTRs No saddle anesthesia Straight leg negative Hip and knee exam--normal    LABS: Results for orders placed or performed in visit on 11/28/14  POCT urinalysis dipstick  Result Value Ref Range   Color, UA yellow    Clarity, UA slightly cloudy    Glucose, UA neg    Bilirubin, UA neg    Ketones, UA neg    Spec Grav, UA 1.020    Blood, UA trace-intact    pH, UA 7.0    Protein, UA neg    Urobilinogen, UA 0.2    Nitrite, UA neg    Leukocytes, UA Trace   POCT UA - Microscopic Only  Result Value Ref Range   WBC, Ur, HPF, POC 1-3    RBC, urine, microscopic 0-2    Bacteria, U Microscopic 3+    Mucus, UA neg    Epithelial cells, urine per micros 3-5    Crystals, Ur, HPF, POC neg    Casts, Ur, LPF, POC neg    Yeast, UA neg       EKG/XRAY:   Primary read interpreted by Dr. Marin Comment at [facility] Neg for fx or dislocation ? jt space narrowin at [facility] jt   ASSESSMENT/PLAN: Encounter Diagnoses  Name Primary?  . Flank pain Yes  . Left-sided low back pain without sciatica   . Radicular pain of lumbosacral region   . Increased urinary frequency   . UTI (urinary tract infection), uncomplicated    Rx Macrobid, urine cx pending, if urine cx is negative then will stop it.  Rx flexeril, percocet, and also mobic IF she cont to have pain in the SI region only consider referral to physiatry for ESI F/u prn  Gross sideeffects, risk and benefits, and alternatives of medications d/w patient. Patient is aware that all medications have potential sideeffects and we are unable to predict every sideeffect or drug-drug interaction that may occur.  Zaniel Marineau, [place], DO 11/28/2014 10:45 AM

## 2014-11-28 NOTE — Patient Instructions (Signed)
  Sacraliliitis  Urinary Tract Infection Urinary tract infections (UTIs) can develop anywhere along your urinary tract. Your urinary tract is your body's drainage system for removing wastes and extra water. Your urinary tract includes two kidneys, two ureters, a bladder, and a urethra. Your kidneys are a pair of bean-shaped organs. Each kidney is about the size of your fist. They are located below your ribs, one on each side of your spine. CAUSES Infections are caused by microbes, which are microscopic organisms, including fungi, viruses, and bacteria. These organisms are so small that they can only be seen through a microscope. Bacteria are the microbes that most commonly cause UTIs. SYMPTOMS  Symptoms of UTIs may vary by age and gender of the patient and by the location of the infection. Symptoms in young women typically include a frequent and intense urge to urinate and a painful, burning feeling in the bladder or urethra during urination. Older women and men are more likely to be tired, shaky, and weak and have muscle aches and abdominal pain. A fever may mean the infection is in your kidneys. Other symptoms of a kidney infection include pain in your back or sides below the ribs, nausea, and vomiting. DIAGNOSIS To diagnose a UTI, your caregiver will ask you about your symptoms. Your caregiver also will ask to provide a urine sample. The urine sample will be tested for bacteria and white blood cells. White blood cells are made by your body to help fight infection. TREATMENT  Typically, UTIs can be treated with medication. Because most UTIs are caused by a bacterial infection, they usually can be treated with the use of antibiotics. The choice of antibiotic and length of treatment depend on your symptoms and the type of bacteria causing your infection. HOME CARE INSTRUCTIONS  If you were prescribed antibiotics, take them exactly as your caregiver instructs you. Finish the medication even if you feel  better after you have only taken some of the medication.  Drink enough water and fluids to keep your urine clear or pale yellow.  Avoid caffeine, tea, and carbonated beverages. They tend to irritate your bladder.  Empty your bladder often. Avoid holding urine for long periods of time.  Empty your bladder before and after sexual intercourse.  After a bowel movement, women should cleanse from front to back. Use each tissue only once. SEEK MEDICAL CARE IF:   You have back pain.  You develop a fever.  Your symptoms do not begin to resolve within 3 days. SEEK IMMEDIATE MEDICAL CARE IF:   You have severe back pain or lower abdominal pain.  You develop chills.  You have nausea or vomiting.  You have continued burning or discomfort with urination. MAKE SURE YOU:   Understand these instructions.  Will watch your condition.  Will get help right away if you are not doing well or get worse. Document Released: 08/20/2005 Document Revised: 05/11/2012 Document Reviewed: 12/19/2011 [facility] Patient Information 2015 [place], [place]. This information is not intended to replace advice given to you by your health care provider. Make sure you discuss any questions you have with your health care provider.

## 2014-11-29 LAB — URINE CULTURE: Colony Count: 90000

## 2015-01-15 ENCOUNTER — Other Ambulatory Visit (HOSPITAL_COMMUNITY): Payer: Self-pay | Admitting: Family Medicine

## 2015-01-15 DIAGNOSIS — Z1231 Encounter for screening mammogram for malignant neoplasm of breast: Secondary | ICD-10-CM

## 2015-01-24 ENCOUNTER — Ambulatory Visit: Payer: Self-pay | Admitting: Gastroenterology

## 2015-02-19 ENCOUNTER — Ambulatory Visit (HOSPITAL_COMMUNITY): Payer: 59

## 2015-03-01 ENCOUNTER — Ambulatory Visit (HOSPITAL_COMMUNITY)
Admission: RE | Admit: 2015-03-01 | Discharge: 2015-03-01 | Disposition: A | Discharge status: Home / Self Care | Payer: 59 | Source: Ambulatory Visit | Attending: Family Medicine | Admitting: Family Medicine

## 2015-03-01 DIAGNOSIS — Z1231 Encounter for screening mammogram for malignant neoplasm of breast: Secondary | ICD-10-CM | POA: Insufficient documentation

## 2015-05-22 ENCOUNTER — Ambulatory Visit (INDEPENDENT_AMBULATORY_CARE_PROVIDER_SITE_OTHER): Payer: 59 | Admitting: Podiatry

## 2015-05-22 ENCOUNTER — Telehealth: Payer: Self-pay | Admitting: *Deleted

## 2015-05-22 ENCOUNTER — Encounter: Payer: Self-pay | Admitting: Podiatry

## 2015-05-22 VITALS — BP 116/74 | HR 101 | Resp 16

## 2015-05-22 DIAGNOSIS — R609 Edema, unspecified: Secondary | ICD-10-CM

## 2015-05-22 DIAGNOSIS — B353 Tinea pedis: Secondary | ICD-10-CM

## 2015-05-22 DIAGNOSIS — I872 Venous insufficiency (chronic) (peripheral): Secondary | ICD-10-CM

## 2015-05-22 DIAGNOSIS — R0989 Other specified symptoms and signs involving the circulatory and respiratory systems: Secondary | ICD-10-CM

## 2015-05-22 MED ORDER — ECONAZOLE NITRATE 1 % EX CREA: TOPICAL_CREAM | Freq: Every day | CUTANEOUS | Status: DC

## 2015-05-22 NOTE — Telephone Encounter (Signed)
Dr. Milinda Pointer ordered LE arterial dopplers B/L and LE venous insuffiencey studies.  I spoke with [facility] doppler department she states order as LE venous doppler and write in venous insuffiencey study.   Ordered and faxed to [facility].

## 2015-05-22 NOTE — Progress Notes (Signed)
   Subjective:    Patient ID: [name] [name], female    DOB: 06-May-1961, 54 y.o.   MRN: [id number]  HPI patient presents here today with B/L feet itchy and daily swelling, sometimes tingling. It started about 3 months ago and gradually getting worse. The swelling started about 1 year ago. She was recently diagnosed as a pre diabetic(6 months). She does not know what her last A1C was.    Review of Systems  Endocrine: Positive for cold intolerance.  Neurological:       Tingling in feet sometimes       Objective:   Physical Exam: I have reviewed her past medical history medications allergies surgery social history and review of systems. Pulses are nonpalpable bilateral and capillary fill time is sluggish. Moderate edema nonpitting in nature to the bilateral lower extremity pain. Neurologic sensorium is intact per Semmes-Weinstein monofilament. Deep tendon reflexes are intact bilateral muscle strength equal and reactive bilateral. Orthopedic evaluation of his results was distal to the ankle for range of motion without crepitation. Rectus foot type is noted bilateral. Cutaneous evaluation of a straight supple well-hydrated cutis no erythema edema cellulitis drainage or odor dry xerotic skin to the plantar medial and lateral aspect of the foot in a moccasin distribution.      Assessment & Plan:  Assessment: Concerned about venous insufficiency to the bilateral lower extremity. I'm also concerned about peripheral arterial disease. She also has tinea pedis.  Plan: Discussed etiology pathology conservative versus surgical therapies. At this point we are going to refer her to the vascular department for evaluation of her arterial inflow and venous outflow. I wrote a prescription for Spectazole cream. Follow up with her for compression hose.

## 2015-05-23 ENCOUNTER — Telehealth (HOSPITAL_COMMUNITY): Payer: Self-pay | Admitting: *Deleted

## 2015-05-29 ENCOUNTER — Ambulatory Visit (HOSPITAL_COMMUNITY): Payer: 59

## 2015-05-29 ENCOUNTER — Telehealth: Payer: Self-pay | Admitting: *Deleted

## 2015-05-29 DIAGNOSIS — R609 Edema, unspecified: Secondary | ICD-10-CM

## 2015-05-29 NOTE — Telephone Encounter (Addendum)
[name] [name] states [facility] does not perform Venous Insuffiencey testing and cannot order the arterial dopplers until the procedure is complete.  [facility] does the required testing.  Reordered the Lower extremity venous insuffiencey/reflux ([id number]), and arterial doppler with [facility].

## 2015-05-30 ENCOUNTER — Other Ambulatory Visit: Payer: Self-pay | Admitting: Podiatry

## 2015-05-30 ENCOUNTER — Ambulatory Visit (INDEPENDENT_AMBULATORY_CARE_PROVIDER_SITE_OTHER)
Admission: RE | Admit: 2015-05-30 | Discharge: 2015-05-30 | Disposition: A | Discharge status: Home / Self Care | Payer: 59 | Source: Ambulatory Visit | Attending: Vascular Surgery | Admitting: Vascular Surgery

## 2015-05-30 ENCOUNTER — Ambulatory Visit (HOSPITAL_COMMUNITY)
Admission: RE | Admit: 2015-05-30 | Discharge: 2015-05-30 | Disposition: A | Discharge status: Home / Self Care | Payer: 59 | Source: Ambulatory Visit | Attending: Vascular Surgery | Admitting: Vascular Surgery

## 2015-05-30 DIAGNOSIS — R609 Edema, unspecified: Secondary | ICD-10-CM | POA: Diagnosis present

## 2015-05-30 DIAGNOSIS — R0989 Other specified symptoms and signs involving the circulatory and respiratory systems: Secondary | ICD-10-CM | POA: Diagnosis not present

## 2015-05-31 ENCOUNTER — Encounter (HOSPITAL_COMMUNITY): Payer: 59

## 2015-06-01 ENCOUNTER — Encounter (HOSPITAL_COMMUNITY): Payer: 59

## 2015-06-05 ENCOUNTER — Telehealth: Payer: Self-pay | Admitting: *Deleted

## 2015-06-05 NOTE — Telephone Encounter (Signed)
Dr. Milinda Pointer states pt's arterial studies are okay.  Dr. Jacqualyn Posey reviewed pt's Venous Reflux studies and referred pt to Venous Specialist.  I left message informing pt her results were in and call for instructions.

## 2015-06-06 ENCOUNTER — Telehealth: Payer: Self-pay | Admitting: *Deleted

## 2015-06-06 DIAGNOSIS — I872 Venous insufficiency (chronic) (peripheral): Secondary | ICD-10-CM

## 2015-06-06 NOTE — Telephone Encounter (Signed)
Dr. Jacqualyn Posey referred pt to [facility] for evaluation and treatment of abnormal Left leg venous reflux study.

## 2015-06-14 ENCOUNTER — Other Ambulatory Visit: Payer: Self-pay | Admitting: Family Medicine

## 2015-07-10 ENCOUNTER — Encounter: Payer: Self-pay | Admitting: Vascular Surgery

## 2015-07-11 ENCOUNTER — Ambulatory Visit (INDEPENDENT_AMBULATORY_CARE_PROVIDER_SITE_OTHER): Payer: 59 | Admitting: Vascular Surgery

## 2015-07-11 ENCOUNTER — Encounter: Payer: Self-pay | Admitting: Vascular Surgery

## 2015-07-11 VITALS — BP 106/75 | HR 91 | Temp 98.0°F | Resp 18 | Ht 63.0 in | Wt 281.0 lb

## 2015-07-11 DIAGNOSIS — M7989 Other specified soft tissue disorders: Secondary | ICD-10-CM | POA: Insufficient documentation

## 2015-07-11 DIAGNOSIS — L299 Pruritus, unspecified: Secondary | ICD-10-CM | POA: Insufficient documentation

## 2015-07-11 DIAGNOSIS — M25571 Pain in right ankle and joints of right foot: Secondary | ICD-10-CM | POA: Diagnosis not present

## 2015-07-11 DIAGNOSIS — I872 Venous insufficiency (chronic) (peripheral): Secondary | ICD-10-CM

## 2015-07-11 DIAGNOSIS — M25579 Pain in unspecified ankle and joints of unspecified foot: Secondary | ICD-10-CM | POA: Insufficient documentation

## 2015-07-11 NOTE — Progress Notes (Signed)
[facility] Specialist of [facility]  Patient name: [name] [name] MRN: [id number] DOB: [birth year] Sex: female  REASON FOR CONSULT: Pain and swelling in both lower extremities.  HPI: [name] [name] is a 54 y.o. female who developed increasing swelling of both ankles over the last 3-4 months and has had pain associated with this. She had some mild swelling for up to a year. Recently she has also had some pruritus. She denies any previous history of DVT or phlebitis. She's had no previous abdominal surgery, groin surgery, or radiation therapy to her abdomen or groins. Swelling is aggravated by standing and sitting. Of note at work she stands and sits for most of the day.   Past Medical History  Diagnosis Date  . Hypertension   . Diabetes mellitus without complication     pre diabetes   Family History  Problem Relation Age of Onset  . Diabetes Brother   . Hypertension Brother    SOCIAL HISTORY: Social History  Substance Use Topics  . Smoking status: Never Smoker   . Smokeless tobacco: Never Used  . Alcohol Use: No   Allergies  Allergen Reactions  . Penicillins Hives and Shortness Of Breath   Current Outpatient Prescriptions  Medication Sig Dispense Refill  . econazole nitrate 1 % cream Apply topically daily. 85 g 2  . fluticasone (FLONASE) 50 MCG/ACT nasal spray INHALE 1 SPRAY INTO EACH NOSTRIL ONCE DAILY  11  . phentermine 15 MG capsule TK 1 C PO QD  2  . quinapril-hydrochlorothiazide (ACCURETIC) 20-12.5 MG per tablet Take 1 tablet by mouth daily.    . valACYclovir (VALTREX) 500 MG tablet TAKE 1 TABLET BY MOUTH EVERY DAY 30 tablet 0  . phentermine 30 MG capsule TK ONE C PO ONCE D  4   Current Facility-Administered Medications  Medication Dose Route Frequency Provider Last Rate Last Dose  . acetaminophen (TYLENOL) tablet 975 mg  975 mg Oral Once Gay Filler Copland, MD      . ibuprofen (ADVIL,MOTRIN) tablet 400 mg  400 mg Oral Once Darreld Mclean, MD         REVIEW OF SYSTEMS: Valu.Nieves ] denotes positive finding; [  ] denotes negative finding  CARDIOVASCULAR:  [ ]  chest pain   [ ]  chest pressure   [ ]  palpitations   [ ]  orthopnea   [ ]  dyspnea on exertion   [ ]  claudication   [ ]  rest pain   [ ]  DVT   [ ]  phlebitis PULMONARY:   [ ]  productive cough   [ ]  asthma   [ ]  wheezing NEUROLOGIC:   [ ]  weakness  [ ]  paresthesias  [ ]  aphasia  [ ]  amaurosis  [ ]  dizziness HEMATOLOGIC:   [ ]  bleeding problems   [ ]  clotting disorders MUSCULOSKELETAL:  [ ]  joint pain   [ ]  joint swelling Valu.Nieves ] leg swelling GASTROINTESTINAL: Valu.Nieves ]  blood in stool  [ ]   hematemesis GENITOURINARY:  [ ]   dysuria  [ ]   hematuria PSYCHIATRIC:  [ ]  history of major depression INTEGUMENTARY:  [ ]  rashes  [ ]  ulcers CONSTITUTIONAL:  [ ]  fever   [ ]  chills  PHYSICAL EXAM: Filed Vitals:   07/11/15 1121  BP: 106/75  Pulse: 91  Temp: 98 F (36.7 C)  TempSrc: Oral  Resp: 18  Height: 5\' 3"  (1.6 m)  Weight: 281 lb (127.461 kg)  SpO2: 97%   GENERAL: The patient is a well-nourished female, in  no acute distress. The vital signs are documented above. CARDIAC: There is a regular rate and rhythm.  VASCULAR: I do not detect carotid bruits. I cannot palpate pedal pulses although both feet are warm and well perfused. She has mild bilateral lower extremity swelling. PULMONARY: There is good air exchange bilaterally without wheezing or rales. ABDOMEN: Soft and non-tender with normal pitched bowel sounds.  MUSCULOSKELETAL: There are no major deformities or cyanosis. NEUROLOGIC: No focal weakness or paresthesias are detected. SKIN: There are no ulcers or rashes noted. PSYCHIATRIC: The patient has a normal affect.  DATA:  LOWER EXTREMITY VENOUS DUPLEX: I have reviewed the lower extreme venous duplex scan from 05/30/2015. This shows no evidence of DVT bilaterally. On the right side there was no deep vein reflux and no reflux in the saphenous vein. On the left side there was some reflux in the  common femoral vein. There was no other deep vein reflux and no reflux in the saphenous vein.  I have also reviewed the arterial Doppler study from 05/30/2015. This shows triphasic Doppler signals in both feet with normal ABIs.  MEDICAL ISSUES:  MILD LYMPHEDEMA: I think her swelling likely is related to mild bilateral lower extremity lymphedema. She did not have any significant venous disease on her duplex except for some mild reflux in the left common femoral vein. He is on her feet a lot and we have discussed the importance of intermittent leg elevation in the proper positioning for this. In addition I have given her a prescription for knee-high compression stockings with a gradient of 15-20 mmHg. I have encouraged her to stay as active as possible. I reassured her that she had excellent arterial flow. I will see her back as needed.   [name] [name] [facility] [place]: [phone]

## 2016-01-21 ENCOUNTER — Other Ambulatory Visit: Payer: Self-pay

## 2016-01-21 DIAGNOSIS — Z1231 Encounter for screening mammogram for malignant neoplasm of breast: Secondary | ICD-10-CM

## 2016-01-22 ENCOUNTER — Other Ambulatory Visit: Payer: Self-pay | Admitting: Family Medicine

## 2016-01-22 ENCOUNTER — Other Ambulatory Visit (HOSPITAL_COMMUNITY)
Admission: RE | Admit: 2016-01-22 | Discharge: 2016-01-22 | Disposition: A | Discharge status: Home / Self Care | Payer: 59 | Source: Ambulatory Visit | Attending: Family Medicine | Admitting: Family Medicine

## 2016-01-22 DIAGNOSIS — Z01419 Encounter for gynecological examination (general) (routine) without abnormal findings: Secondary | ICD-10-CM | POA: Diagnosis present

## 2016-01-22 DIAGNOSIS — Z1151 Encounter for screening for human papillomavirus (HPV): Secondary | ICD-10-CM | POA: Insufficient documentation

## 2016-01-23 LAB — CYTOLOGY - PAP

## 2016-02-22 ENCOUNTER — Other Ambulatory Visit (HOSPITAL_COMMUNITY): Payer: Self-pay | Admitting: General Surgery

## 2016-03-03 ENCOUNTER — Ambulatory Visit
Admission: RE | Admit: 2016-03-03 | Discharge: 2016-03-03 | Disposition: A | Discharge status: Home / Self Care | Payer: 59 | Source: Ambulatory Visit

## 2016-03-03 DIAGNOSIS — Z1231 Encounter for screening mammogram for malignant neoplasm of breast: Secondary | ICD-10-CM

## 2016-03-07 ENCOUNTER — Ambulatory Visit (HOSPITAL_COMMUNITY): Payer: 59

## 2016-03-13 ENCOUNTER — Ambulatory Visit: Payer: 59 | Admitting: Dietician

## 2016-05-10 ENCOUNTER — Ambulatory Visit (INDEPENDENT_AMBULATORY_CARE_PROVIDER_SITE_OTHER): Payer: 59

## 2016-05-10 ENCOUNTER — Ambulatory Visit (INDEPENDENT_AMBULATORY_CARE_PROVIDER_SITE_OTHER): Payer: 59 | Admitting: Internal Medicine

## 2016-05-10 VITALS — BP 128/74 | HR 114 | Temp 98.8°F | Resp 16 | Ht 63.0 in | Wt 293.0 lb

## 2016-05-10 DIAGNOSIS — R05 Cough: Secondary | ICD-10-CM

## 2016-05-10 DIAGNOSIS — J4521 Mild intermittent asthma with (acute) exacerbation: Secondary | ICD-10-CM | POA: Diagnosis not present

## 2016-05-10 DIAGNOSIS — M1990 Unspecified osteoarthritis, unspecified site: Secondary | ICD-10-CM | POA: Diagnosis not present

## 2016-05-10 DIAGNOSIS — R059 Cough, unspecified: Secondary | ICD-10-CM

## 2016-05-10 MED ORDER — BECLOMETHASONE DIPROPIONATE 80 MCG/ACT IN AERS: 2.0000 | INHALATION_SPRAY | Freq: Two times a day (BID) | RESPIRATORY_TRACT | Status: DC

## 2016-05-10 MED ORDER — IPRATROPIUM BROMIDE 0.02 % IN SOLN
0.5000 mg | Freq: Once | RESPIRATORY_TRACT | Status: AC
  Administered 2016-05-10: 0.5 mg via RESPIRATORY_TRACT

## 2016-05-10 MED ORDER — ALBUTEROL SULFATE (2.5 MG/3ML) 0.083% IN NEBU
2.5000 mg | INHALATION_SOLUTION | Freq: Once | RESPIRATORY_TRACT | Status: AC
  Administered 2016-05-10: 2.5 mg via RESPIRATORY_TRACT

## 2016-05-10 MED ORDER — HYDROCODONE-HOMATROPINE 5-1.5 MG/5ML PO SYRP: 5.0000 mL | ORAL_SOLUTION | Freq: Four times a day (QID) | ORAL | Status: DC | PRN

## 2016-05-10 MED ORDER — PREDNISONE 20 MG PO TABS
ORAL_TABLET | ORAL | Status: DC
Start: 2016-05-10 — End: 2016-05-31

## 2016-05-10 NOTE — Patient Instructions (Signed)
     IF you received an x-ray today, you will receive an invoice from [facility]. Please contact [facility] at [phone] with questions or concerns regarding your invoice.   IF you received labwork today, you will receive an invoice from [facility]. Please contact [facility] at [phone] with questions or concerns regarding your invoice.   Our billing staff will not be able to assist you with questions regarding bills from these companies.  You will be contacted with the lab results as soon as they are available. The fastest way to get your results is to activate your My Chart account. Instructions are located on the last page of this paperwork. If you have not heard from us regarding the results in 2 weeks, please contact this office.      

## 2016-05-10 NOTE — Progress Notes (Signed)
Subjective:  By signing my name below, I, Raven Small, attest that this documentation has been prepared under the direction and in the presence of Tami Lin, MD.  Electronically Signed: Thea Alken, ED Scribe. 05/10/2016. 8:25 AM.   Patient ID: [name] [name], female    DOB: May 11, 1961, 55 y.o.   MRN: [id number]  HPI Chief Complaint  Patient presents with  . Cough    8 days   . Sore Throat  . Fatigue    HPI Comments: [name] [name] is a 55 y.o. female who presents to the [facility] complaining of productive cough consisting of green sputum that began 8 days ago. Pt reports associated HA, wheeze, sneezing, and waking up with crust around eyes. She was seen by PCP, treated with Zpack, cough medication and inhaler without relief to symptoms. Pt states for the past month she has been using her albuterol inhaler more.  Pt denies fever, chills.   Patient Active Problem List   Diagnosis Date Noted  . Itching-Bilateral dorsum foot 07/11/2015  . Pain in joint, ankle and foot 07/11/2015  . Foot swelling 07/11/2015  . FUO (fever of unknown origin) 03/14/2012  . Hypokalemia 03/14/2012  . Hyponatremia 03/14/2012  . Dehydration 03/14/2012  . LBBB (left bundle branch block) 03/14/2012  . OBESITY 03/07/2008  . HYPERTENSION 03/07/2008  . [place] DISEASE 03/07/2008  . ABSCESS, PERIRECTAL 03/07/2008   Past Medical History  Diagnosis Date  . Hypertension   . Diabetes mellitus without complication ([place])     pre diabetes   Past Surgical History  Procedure Laterality Date  . Breast surgery    . Joint replacement     Allergies  Allergen Reactions  . Penicillins Hives and Shortness Of Breath   Prior to Admission medications   Medication Sig Start Date End Date Taking? Authorizing Provider  albuterol (PROVENTIL HFA;VENTOLIN HFA) 108 (90 Base) MCG/ACT inhaler Inhale into the lungs every 6 (six) hours as needed for wheezing or shortness of breath.   Yes  Historical Provider, MD  azithromycin (ZITHROMAX) 250 MG tablet Take 250 mg by mouth daily.   Yes Historical Provider, MD  econazole nitrate 1 % cream Apply topically daily. 05/22/15  Yes Max T Hyatt, DPM  fluticasone (FLONASE) 50 MCG/ACT nasal spray INHALE 1 SPRAY INTO EACH NOSTRIL ONCE DAILY 05/11/15  Yes Historical Provider, MD  guaiFENesin-codeine (VIRTUSSIN A/C) 100-10 MG/5ML syrup Take 5 mLs by mouth 3 (three) times daily as needed for cough.   Yes Historical Provider, MD  quinapril-hydrochlorothiazide (ACCURETIC) 20-12.5 MG per tablet Take 1 tablet by mouth daily.   Yes Historical Provider, MD  valACYclovir (VALTREX) 500 MG tablet TAKE 1 TABLET BY MOUTH EVERY DAY 06/14/15  Yes Thao P Le, DO    Review of Systems  Constitutional: Positive for fatigue. Negative for fever and chills.  HENT: Positive for sneezing. Negative for congestion.   Respiratory: Positive for cough and wheezing.   Neurological: Positive for headaches.       Objective:   Physical Exam  Constitutional: She is oriented to person, place, and time. She appears well-developed and well-nourished. No distress.  HENT:  Head: Normocephalic and atraumatic.  Right Ear: External ear normal.  Left Ear: External ear normal.  Mouth/Throat: Oropharynx is clear and moist. No oropharyngeal exudate.  Nares congested.  Eyes: Conjunctivae and EOM are normal. Pupils are equal, round, and reactive to light.  Neck: Normal range of motion. Neck supple.  Cardiovascular: Normal rate, regular rhythm, normal heart sounds  and intact distal pulses.   No murmur heard. Pulmonary/Chest: Effort normal. She has wheezes ( throughout expiration).  Musculoskeletal: Normal range of motion.  Lymphadenopathy:    She has no cervical adenopathy.  Neurological: She is alert and oriented to person, place, and time.  Skin: Skin is warm and dry.  Psychiatric: She has a normal mood and affect. Her behavior is normal.  Nursing note and vitals  reviewed.   Filed Vitals:   05/10/16 0815  BP: 128/74  Pulse: 114  Temp: 98.8 F (37.1 C)  TempSrc: Oral  Resp: 16  Height: 5\' 3"  (1.6 m)  Weight: 293 lb (132.904 kg)  SpO2: 96%   Dg Chest 2 View  05/10/2016  CLINICAL DATA:  Cough, wheezing. EXAM: CHEST  2 VIEW COMPARISON:  Radiograph of July 26, 2014. FINDINGS: The heart size and mediastinal contours are within normal limits. Both lungs are clear. The visualized skeletal structures are unremarkable. IMPRESSION: No active cardiopulmonary disease. Electronically Signed   By: Marijo Conception, M.D.   On: 05/10/2016 09:18   Neb albut/atrov=good response with diminished but not resolved expir wheezing Heart rate 85 Assessment & Plan:  Cough - Plan: DG Chest 2 View  Asthma with acute exacerbation, mild intermittent - Plan: albuterol (PROVENTIL) (2.5 MG/3ML) 0.083% nebulizer solution 2.5 mg, ipratropium (ATROVENT) nebulizer solution 0.5 mg  Arthritis associated with another disorder---see recent med changes  Plan: is on zithr///needs higher dose pred plus 47mos of inhaled steroids  Meds ordered this encounter  Medications  . albuterol (PROVENTIL HFA;VENTOLIN HFA) 108 (90 Base) MCG/ACT inhaler    Sig: Inhale into the lungs every 6 (six) hours as needed for wheezing or shortness of breath.  . predniSONE (DELTASONE) 20 MG tablet    Sig: 4/4/3/3/2/2/1/1 single daily dose for 8 days    Dispense:  20 tablet    Refill:  0  . beclomethasone (QVAR) 80 MCG/ACT inhaler    Sig: Inhale 2 puffs into the lungs 2 (two) times daily. To prevent asthma--use daily for next 2 months    Dispense:  1 Inhaler    Refill:  2  . HYDROcodone-homatropine (HYCODAN) 5-1.5 MG/5ML syrup    Sig: Take 5 mLs by mouth every 6 (six) hours as needed.    Dispense:  120 mL    Refill:  0   F/u 1 week if not better

## 2016-05-31 ENCOUNTER — Encounter: Payer: Self-pay | Admitting: Physician Assistant

## 2016-05-31 ENCOUNTER — Ambulatory Visit (INDEPENDENT_AMBULATORY_CARE_PROVIDER_SITE_OTHER): Payer: 59 | Admitting: Physician Assistant

## 2016-05-31 ENCOUNTER — Ambulatory Visit (INDEPENDENT_AMBULATORY_CARE_PROVIDER_SITE_OTHER): Payer: 59

## 2016-05-31 VITALS — BP 128/80 | HR 106 | Temp 98.2°F | Resp 16 | Ht 64.0 in | Wt 292.0 lb

## 2016-05-31 DIAGNOSIS — M461 Sacroiliitis, not elsewhere classified: Secondary | ICD-10-CM

## 2016-05-31 DIAGNOSIS — M25552 Pain in left hip: Secondary | ICD-10-CM

## 2016-05-31 MED ORDER — PREDNISONE 20 MG PO TABS
ORAL_TABLET | ORAL | Status: AC
Start: 2016-05-31 — End: 2016-06-09

## 2016-05-31 NOTE — Progress Notes (Signed)
[facility] [address], [place] [address] [phone]  Date:  05/31/2016   Name:  [name] [name]   DOB:  12-Dec-1960   MRN:  [id number]  PCP:  Jonathon Bellows, MD    History of Present Illness:  [name] is a 55 y.o. female patient who presents to [facility] for cc of left hip pain.  Pain started in the last 5 days and has not improved. Patient states that the pain starts in her back and radiates down her leg and to the medial side of her ankle. She has been seen by an urgent care and [facility]. She states that she was given an anti-inflammatory, tramadol, a low-dose opiate. This is not helped with the pain except in her back. She continues to have the hip pain. There is some numbness and tingling down the medial side of her leg to her ankle as well. She has had no fever. There is no incontinence. She feels unstable on it and that it feels weaker. She has had no trauma. No heavy lifting. She has never had this pain before. She has a history of inflammatory bowel disease and rheumatoid arthritis. She is currently taking her medication as prescribed for her rheumatoid arthritis. She has no history of ankylosing spondylitis.       Patient Active Problem List   Diagnosis Date Noted  . 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 Itching-Bilateral dorsum foot 07/11/2015  . Pain in joint, ankle and foot 07/11/2015  . Foot swelling 07/11/2015  . FUO (fever of unknown origin) 03/14/2012  . Hypokalemia 03/14/2012  . Hyponatremia 03/14/2012  . Dehydration 03/14/2012  . LBBB (left bundle branch block) 03/14/2012  . OBESITY 03/07/2008  . HYPERTENSION 03/07/2008  . [place] DISEASE 03/07/2008  . ABSCESS, PERIRECTAL 03/07/2008    Past Medical History  Diagnosis Date  . Hypertension   . Diabetes mellitus without complication ([place])     pre diabetes    Past Surgical History  Procedure Laterality Date  . Breast surgery    . Joint  replacement      Social History  Substance Use Topics  . Smoking status: Never Smoker   . Smokeless tobacco: Never Used  . Alcohol Use: No    Family History  Problem Relation Age of Onset  . Diabetes Brother   . Hypertension Brother     Allergies  Allergen Reactions  . Penicillins Hives and Shortness Of Breath    Medication list has been reviewed and updated.  Current Outpatient Prescriptions on File Prior to Visit  Medication Sig Dispense Refill  . albuterol (PROVENTIL HFA;VENTOLIN HFA) 108 (90 Base) MCG/ACT inhaler Inhale into the lungs every 6 (six) hours as needed for wheezing or shortness of breath.    Marland Kitchen azithromycin (ZITHROMAX) 250 MG tablet Take 250 mg by mouth daily.    . fluticasone (FLONASE) 50 MCG/ACT nasal spray INHALE 1 SPRAY INTO EACH NOSTRIL ONCE DAILY  11  . leflunomide (ARAVA) 10 MG tablet     . quinapril-hydrochlorothiazide (ACCURETIC) 20-12.5 MG per tablet Take 1 tablet by mouth daily.    . valACYclovir (VALTREX) 500 MG tablet TAKE 1 TABLET BY MOUTH EVERY DAY 30 tablet 0  . methotrexate (RHEUMATREX) 2.5 MG tablet Reported on 05/31/2016  2   Current Facility-Administered Medications on File Prior to Visit  Medication Dose Route Frequency Provider Last Rate Last Dose  . acetaminophen (TYLENOL) tablet 975 mg  975 mg Oral Once Janett Billow C  Copland, MD      . ibuprofen (ADVIL,MOTRIN) tablet 400 mg  400 mg Oral Once Jessica C Copland, MD        ROS ROS otherwise unremarkable unless listed above.  Physical Examination: BP 128/80 mmHg  Pulse 106  Temp(Src) 98.2 F (36.8 C) (Oral)  Resp 16  Ht 5\' 4"  (1.626 m)  Wt 292 lb (132.45 kg)  BMI 50.10 kg/m2  SpO2 95% Ideal Body Weight: Weight in (lb) to have BMI = 25: 145.3  Physical Exam  Constitutional: She is oriented to person, place, and time. She appears well-developed and well-nourished. No distress.  HENT:  Head: Normocephalic and atraumatic.  Right Ear: External ear normal.  Left Ear: External ear  normal.  Eyes: Conjunctivae and EOM are normal. Pupils are equal, round, and reactive to light.  Cardiovascular: Normal rate.   Pulmonary/Chest: Effort normal and breath sounds normal. No respiratory distress.  Musculoskeletal:       Lumbar back: She exhibits normal range of motion and no tenderness.  Left hip with no erythema, swelling, or crepitus. Range of motion is normal. There is pain with hip external rotation. No weakness to the hip. Lower extremity strength 3 out of 5 left side. There is sensation disturbance along the medial side extending just proximal to the medial malleolus, starting in the medial thigh.  Neurological: She is alert and oriented to person, place, and time.  Skin: She is not diaphoretic.  Psychiatric: She has a normal mood and affect. Her behavior is normal.   Dg Hip Unilat W Or W/o Pelvis 2-3 Views Left  05/31/2016  CLINICAL DATA:  LEFT hip pain for 1 week, no known injury EXAM: DG HIP (WITH OR WITHOUT PELVIS) 2-3V LEFT COMPARISON:  None ; correlation lumbar spine radiographs 11/28/2014 FINDINGS: Hip joints symmetric and preserved. No acute fracture, dislocation, or bone destruction. Mild sclerosis at the SI joints bilaterally unchanged. IMPRESSION: No acute LEFT hip abnormalities. Mild BILATERAL sacroiliitis. Electronically Signed   By: Lavonia Dana M.D.   On: 05/31/2016 09:36     Assessment and Plan: [name] [name] is a 55 y.o. female who is here today  Left hip pain appears more likely associated with her rheumatoid arthritis. I am placing a or for all back to her rheumatologist. She will also be calling at home. We have given her a prednisone taper for 9 days. I've also advised that she continue some low back and a PPI was given to her as well (Nexium). She is to follow-up with me if there is no improvement within a week and she is not able to get in contact with the rheumatologist. Sooner for alarming symptoms as discussed in office. Sacroiliitis ([place]) -  Plan: predniSONE (DELTASONE) 20 MG tablet  Left hip pain - Plan: DG HIP UNILAT W OR W/O PELVIS 2-3 VIEWS LEFT, predniSONE (DELTASONE) 20 MG tablet  Ivar Drape, PA-C [facility] 7/8/20172:16 PM

## 2016-05-31 NOTE — Progress Notes (Signed)
[facility] [address], [place] [address] [phone]  Date:  05/31/2016   Name:  [name] [name]   DOB:  12/25/1960   MRN:  [id number]  PCP:  Jonathon Bellows, MD    History of Present Illness:  [name] [name] is a 55 y.o. female patient who presents to [facility]    Shooting stabbing pain down leg to ankle.  Pain at the left hip.  Left hip pain that started 5 days ago.  Pain has not worsened.  No heavy lifting or exercising.  She has seen several hip.  No incontinence.  No fever.  It feels weak like it has been unstable.    Patient Active Problem List   Diagnosis Date Noted  . 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 Itching-Bilateral dorsum foot 07/11/2015  . Pain in joint, ankle and foot 07/11/2015  . Foot swelling 07/11/2015  . FUO (fever of unknown origin) 03/14/2012  . Hypokalemia 03/14/2012  . Hyponatremia 03/14/2012  . Dehydration 03/14/2012  . LBBB (left bundle branch block) 03/14/2012  . OBESITY 03/07/2008  . HYPERTENSION 03/07/2008  . [place] DISEASE 03/07/2008  . ABSCESS, PERIRECTAL 03/07/2008    Past Medical History  Diagnosis Date  . Hypertension   . Diabetes mellitus without complication ([place])     pre diabetes    Past Surgical History  Procedure Laterality Date  . Breast surgery    . Joint replacement      Social History  Substance Use Topics  . Smoking status: Never Smoker   . Smokeless tobacco: Never Used  . Alcohol Use: No    Family History  Problem Relation Age of Onset  . Diabetes Brother   . Hypertension Brother     Allergies  Allergen Reactions  . Penicillins Hives and Shortness Of Breath    Medication list has been reviewed and updated.  Current Outpatient Prescriptions on File Prior to Visit  Medication Sig Dispense Refill  . albuterol (PROVENTIL HFA;VENTOLIN HFA) 108 (90 Base) MCG/ACT inhaler Inhale into the lungs every 6 (six) hours as needed for wheezing or shortness of breath.     Marland Kitchen azithromycin (ZITHROMAX) 250 MG tablet Take 250 mg by mouth daily.    . fluticasone (FLONASE) 50 MCG/ACT nasal spray INHALE 1 SPRAY INTO EACH NOSTRIL ONCE DAILY  11  . leflunomide (ARAVA) 10 MG tablet     . quinapril-hydrochlorothiazide (ACCURETIC) 20-12.5 MG per tablet Take 1 tablet by mouth daily.    . valACYclovir (VALTREX) 500 MG tablet TAKE 1 TABLET BY MOUTH EVERY DAY 30 tablet 0  . methotrexate (RHEUMATREX) 2.5 MG tablet Reported on 05/31/2016  2  . predniSONE (DELTASONE) 20 MG tablet 4/4/3/3/2/2/1/1 single daily dose for 8 days (Patient not taking: Reported on 05/31/2016) 20 tablet 0   Current Facility-Administered Medications on File Prior to Visit  Medication Dose Route Frequency Provider Last Rate Last Dose  . acetaminophen (TYLENOL) tablet 975 mg  975 mg Oral Once Gay Filler Copland, MD      . ibuprofen (ADVIL,MOTRIN) tablet 400 mg  400 mg Oral Once Darreld Mclean, MD        ROS   Physical Examination: BP 128/80 mmHg  Pulse 106  Temp(Src) 98.2 F (36.8 C) (Oral)  Resp 16  Ht 5\' 4"  (1.626 m)  Wt 292 lb (132.45 kg)  BMI 50.10 kg/m2  SpO2 95% Ideal Body Weight: Weight in (lb) to have BMI = 25: 145.3  Physical Exam  Dg  Hip Unilat W Or W/o Pelvis 2-3 Views Left  05/31/2016  CLINICAL DATA:  LEFT hip pain for 1 week, no known injury EXAM: DG HIP (WITH OR WITHOUT PELVIS) 2-3V LEFT COMPARISON:  None ; correlation lumbar spine radiographs 11/28/2014 FINDINGS: Hip joints symmetric and preserved. No acute fracture, dislocation, or bone destruction. Mild sclerosis at the SI joints bilaterally unchanged. IMPRESSION: No acute LEFT hip abnormalities. Mild BILATERAL sacroiliitis. Electronically Signed   By: Lavonia Dana M.D.   On: 05/31/2016 09:36    Assessment and Plan: [name] [name] is a 55 y.o. female who is here today  There are no diagnoses linked to this encounter.  Ivar Drape, PA-C [facility] 05/31/2016 9:01  AM

## 2016-05-31 NOTE — Patient Instructions (Addendum)
     IF you received an x-ray today, you will receive an invoice from [facility]. Please contact [facility] at [phone] with questions or concerns regarding your invoice.   IF you received labwork today, you will receive an invoice from [employer]. Please contact [facility] at [phone] with questions or concerns regarding your invoice.   Our billing staff will not be able to assist you with questions regarding bills from these companies.  You will be contacted with the lab results as soon as they are available. The fastest way to get your results is to activate your My Chart account. Instructions are located on the last page of this paperwork. If you have not heard from [place] regarding the results in 2 weeks, please contact this office.    Take in the mornings. Please attempt to contact your rheumatologist, for a current visit.  I will do the same.  Please let me know if your pain has not improved within 7 days.  Return if worsened.

## 2016-06-19 ENCOUNTER — Other Ambulatory Visit: Payer: Self-pay | Admitting: Orthopedic Surgery

## 2016-06-19 DIAGNOSIS — G8929 Other chronic pain: Secondary | ICD-10-CM

## 2016-06-19 DIAGNOSIS — M545 Low back pain: Principal | ICD-10-CM

## 2016-06-28 ENCOUNTER — Ambulatory Visit
Admission: RE | Admit: 2016-06-28 | Discharge: 2016-06-28 | Disposition: A | Discharge status: Home / Self Care | Payer: 59 | Source: Ambulatory Visit | Attending: Orthopedic Surgery | Admitting: Orthopedic Surgery

## 2016-06-28 DIAGNOSIS — M545 Low back pain, unspecified: Secondary | ICD-10-CM

## 2016-06-28 DIAGNOSIS — G8929 Other chronic pain: Secondary | ICD-10-CM

## 2016-07-01 ENCOUNTER — Encounter: Payer: 59 | Attending: General Surgery | Admitting: Dietician

## 2016-07-01 DIAGNOSIS — R0683 Snoring: Secondary | ICD-10-CM | POA: Insufficient documentation

## 2016-07-01 DIAGNOSIS — Z6841 Body Mass Index (BMI) 40.0 and over, adult: Secondary | ICD-10-CM | POA: Insufficient documentation

## 2016-07-01 DIAGNOSIS — Z713 Dietary counseling and surveillance: Secondary | ICD-10-CM | POA: Insufficient documentation

## 2016-07-01 DIAGNOSIS — E669 Obesity, unspecified: Secondary | ICD-10-CM

## 2016-07-01 DIAGNOSIS — I10 Essential (primary) hypertension: Secondary | ICD-10-CM | POA: Diagnosis not present

## 2016-07-01 DIAGNOSIS — M0579 Rheumatoid arthritis with rheumatoid factor of multiple sites without organ or systems involvement: Secondary | ICD-10-CM | POA: Insufficient documentation

## 2016-07-01 NOTE — Patient Instructions (Signed)
Follow Pre-Op Goals Try Protein Shakes Call [facility] at [phone] when surgery is scheduled to enroll in Pre-Op Class  Things to remember:  Please always be honest with us. We want to support you!  If you have any questions or concerns in between appointments, please call or email Liz, Leslie, or Laurie.  The diet after surgery will be high protein and low in carbohydrate.  Vitamins and calcium need to be taken for the rest of your life.  Feel free to include support people in any classes or appointments.   Supplement recommendations:  Complete" Multivitamin: Sleeve Gastrectomy and RYGB patients take a double dose of MVI. LAGB patients take single dose as it is written on the package. Vitamin must be liquid or chewable but not gummy. Examples of these include Flintstones Complete and Centrum Complete. If the vitamin is bariatric-specific, take 1 dose as it is already formulated for bariatric surgery patients. Examples of these are Bariatric Advantage, Celebrate, and Wellesse. These can be found at the [facility] and/or online.     Calcium citrate: 1500 mg/day of Calcium citrate (also chewable or liquid) is recommended for all procedures. The body is only able to absorb 500-600 mg of Calcium at one time so 3 daily doses of 500 mg are recommended. Calcium doses must be taken a minimum of 2 hours apart. Additionally, Calcium must be taken 2 hours apart from iron-containing MVI. Examples of brands include Celebrate, Bariatric Advantage, and Wellesse. These brands must be purchased online or at the [facility]. Citracal Petites is the only Calcium citrate supplement found in general grocery stores and pharmacies. This is in tablet form and may be recommended for patients who do not tolerate chewable Calcium.  Continued or added Vitamin D supplementation based on individual needs.    Vitamin B12: 300-500 mcg/day for Sleeve Gastrectomy and RYGB. Optional for  LAGB patients as stomach remains fully intact. Must be taken intramuscularly, sublingually, or inhaled nasally. Oral route is not recommended. 

## 2016-07-01 NOTE — Progress Notes (Signed)
  Pre-Op Assessment Visit:  Pre-Operative Sleeve Gastrectomy Surgery  Medical Nutrition Therapy:  Appt start time: [id number]   End time:  [id number].  Patient was seen on 07/01/2016 for Pre-Operative Nutrition Assessment. Assessment and letter of approval faxed to [facility] Bariatric Surgery Program coordinator on 07/01/2016.   Preferred Learning Style:   No preference indicated   Learning Readiness:   Ready  Handouts given during visit include:  Pre-Op Goals Bariatric Surgery Protein Shakes   During the appointment today the following Pre-Op Goals were reviewed with the patient: Maintain or lose weight as instructed by your surgeon Make healthy food choices Begin to limit portion sizes Limited concentrated sugars and fried foods Keep fat/sugar in the single digits per serving on   food labels Practice CHEWING your food  (aim for 30 chews per bite or until applesauce consistency) Practice not drinking 15 minutes before, during, and 30 minutes after each meal/snack Avoid all carbonated beverages  Avoid/limit caffeinated beverages  Avoid all sugar-sweetened beverages Consume 3 meals per day; eat every 3-5 hours Make a list of non-food related activities Aim for 64-100 ounces of FLUID daily  Aim for at least 60-80 grams of PROTEIN daily Look for a liquid protein source that contain ?15 g protein and ?5 g carbohydrate  (ex: shakes, drinks, shots)  Patient-Centered Goals: Would like to get off blood pressure medication, improve RA, exercise more, improve back pain, get back into clothes  10 confidence/10 importance scale   Demonstrated degree of understanding via:  Teach Back  Teaching Method Utilized:  Visual Auditory Hands on  Barriers to learning/adherence to lifestyle change: none  Patient to call the [facility] to enroll in Pre-Op and Post-Op Nutrition Education when surgery date is scheduled.

## 2016-07-02 ENCOUNTER — Ambulatory Visit (HOSPITAL_COMMUNITY)
Admission: RE | Admit: 2016-07-02 | Discharge: 2016-07-02 | Disposition: A | Discharge status: Home / Self Care | Payer: 59 | Source: Ambulatory Visit | Attending: General Surgery | Admitting: General Surgery

## 2016-07-02 DIAGNOSIS — K219 Gastro-esophageal reflux disease without esophagitis: Secondary | ICD-10-CM | POA: Insufficient documentation

## 2016-07-02 DIAGNOSIS — K224 Dyskinesia of esophagus: Secondary | ICD-10-CM | POA: Insufficient documentation

## 2016-07-02 DIAGNOSIS — K449 Diaphragmatic hernia without obstruction or gangrene: Secondary | ICD-10-CM | POA: Insufficient documentation

## 2016-07-21 ENCOUNTER — Telehealth: Payer: Self-pay | Admitting: Cardiology

## 2016-07-21 NOTE — Telephone Encounter (Signed)
Received records from [facility] for appointment on 08/15/16 with Dr Percival Spanish for Surgical Clearance.  Records given to [employer] (medical records) for Dr Hochrein's schedule on 08/15/16. lp

## 2016-07-23 ENCOUNTER — Ambulatory Visit (INDEPENDENT_AMBULATORY_CARE_PROVIDER_SITE_OTHER): Payer: 59 | Admitting: Psychiatry

## 2016-07-29 ENCOUNTER — Encounter: Payer: 59 | Attending: General Surgery | Admitting: Dietician

## 2016-07-29 DIAGNOSIS — Z6841 Body Mass Index (BMI) 40.0 and over, adult: Secondary | ICD-10-CM | POA: Insufficient documentation

## 2016-07-29 DIAGNOSIS — I10 Essential (primary) hypertension: Secondary | ICD-10-CM | POA: Diagnosis not present

## 2016-07-29 DIAGNOSIS — R0683 Snoring: Secondary | ICD-10-CM | POA: Insufficient documentation

## 2016-07-29 DIAGNOSIS — Z713 Dietary counseling and surveillance: Secondary | ICD-10-CM | POA: Diagnosis not present

## 2016-07-29 DIAGNOSIS — M0579 Rheumatoid arthritis with rheumatoid factor of multiple sites without organ or systems involvement: Secondary | ICD-10-CM | POA: Insufficient documentation

## 2016-07-29 NOTE — Patient Instructions (Addendum)
Look for yogurt that are nonfat and have less than 15 grams of carbohydrate per serving. (Dannon and Light Fit, Yoplait 100). Have protein with each meal or snack (cheese, egg, meat, yogurt, nuts, beans, cottage cheese, fish, protein shakes). Continue working on chewing well. Start thinking about cutting back on drinking while you are eating.

## 2016-07-29 NOTE — Progress Notes (Signed)
  6 Months Supervised Weight Loss Visit:   Pre-Operative Sleeve Gastrectomy Surgery  Medical Nutrition Therapy:  Appt start time: [id number] end time:  1200.  Primary concerns today: Supervised Weight Loss Visit. Returns with a 1 lb weight gain. Has been working on chewing well and found that it is taking longer to eat. Tried the chocolate Premier and did not like it. Has been trying to eat healthier foods. Still drinking water while she is eating.   Has coffee with 8 sugars and 1 creamer (down from 12 sugars). Cutting back on starches and hasn't had french fries. Eating 2 meals and 3 snacks (fruit).   Weight: 296.2 lbs BMI: 50.8  Preferred Learning Style:   No preference indicated   Learning Readiness:   Ready  24-hr recall: B (AM): poached egg with sausage Snk (AM): fruit or yoplait yogurt   L (PM): plums, apple, orange, grapes, strawberries Snk (PM): none  D (PM): pasta Snk (PM): Naked smoothies  Beverages: coffee with 8 sugars, water  Medications: see list  Recent physical activity:  Isometric exercises, walking inside building each workday  Progress Towards Goal(s):  In progress.  Handouts given during visit include:  none   Nutritional Diagnosis:  [place]-3.3 Obesity related to past poor dietary habits and physical inactivity as evidenced by patient attending supervised weight loss for insurance approval of bariatric surgery.    Intervention:  Nutrition education/diet advancement. Look for yogurt that are nonfat and have less than 15 grams of carbohydrate per serving. (Dannon and Light Fit, Yoplait 100). Have protein with each meal or snack (cheese, egg, meat, yogurt, nuts, beans, cottage cheese, fish, protein shakes). Continue working on chewing well. Start thinking about cutting back on drinking while you are eating.  Teaching Method Utilized:  Visual Auditory Hands on  Barriers to learning/adherence to lifestyle change: none  Demonstrated degree of understanding  via:  Teach Back   Monitoring/Evaluation:  Dietary intake, exercise, and body weight. Follow up in 1 months for 6 month supervised weight loss visit.

## 2016-08-07 ENCOUNTER — Encounter: Payer: Self-pay | Admitting: Cardiology

## 2016-08-07 ENCOUNTER — Ambulatory Visit (INDEPENDENT_AMBULATORY_CARE_PROVIDER_SITE_OTHER): Payer: 59 | Admitting: Cardiology

## 2016-08-07 VITALS — BP 100/80 | HR 110 | Ht 63.0 in | Wt 294.1 lb

## 2016-08-07 DIAGNOSIS — Z01810 Encounter for preprocedural cardiovascular examination: Secondary | ICD-10-CM | POA: Diagnosis not present

## 2016-08-07 DIAGNOSIS — I447 Left bundle-branch block, unspecified: Secondary | ICD-10-CM | POA: Diagnosis not present

## 2016-08-07 DIAGNOSIS — I340 Nonrheumatic mitral (valve) insufficiency: Secondary | ICD-10-CM

## 2016-08-07 DIAGNOSIS — R5383 Other fatigue: Secondary | ICD-10-CM

## 2016-08-07 LAB — TSH: TSH: 1.23 mIU/L

## 2016-08-07 NOTE — Progress Notes (Signed)
Cardiology Office Note   Date:  08/07/2016   ID:  [name] [name], DOB [birth year], MRN [id number]  PCP:  Jonathon Bellows, MD  Cardiologist:   Minus Breeding, MD   Chief Complaint  Patient presents with  . Pre-op Exam      History of Present Illness: [name] [name] is a 55 y.o. female who presents for surgical clearance. She's going to have bariatric sleeve surgery. She has had a history of left bundle branch block.  In 2013 she did have an echo with well-preserved ejection fraction but some mild mitral regurgitation.  She otherwise has had no cardiac problems. She is limited by back and joint problems as well as her obesity but she can still do things like push a vacuum and go up and down stairs to her house. She does this slowly but she does not have any chest discomfort with this. She does not have any neck or arm discomfort. She does have some shortness of breath fatigue with activity but she's not having any resting shortness of breath, PND or orthopnea. She's not having any palpitations, presyncope or syncope. She walks about 2 miles a day at work.  Past Medical History:  Diagnosis Date  . Diabetes mellitus without complication ([place])    pre diabetes  . Hypertension   . Lumbar herniated disc   . Rheumatoid arthritis [facility])     Past Surgical History:  Procedure Laterality Date  . BREAST SURGERY    . JOINT REPLACEMENT     L TKR     Current Outpatient Prescriptions  Medication Sig Dispense Refill  . albuterol (PROVENTIL HFA;VENTOLIN HFA) 108 (90 Base) MCG/ACT inhaler Inhale into the lungs every 6 (six) hours as needed for wheezing or shortness of breath.    . fluticasone (FLONASE) 50 MCG/ACT nasal spray INHALE 1 SPRAY INTO EACH NOSTRIL ONCE DAILY  11  . folic acid (FOLVITE) 1 MG tablet Take 1 tablet by mouth daily.    Marland Kitchen leflunomide (ARAVA) 10 MG tablet     . quinapril-hydrochlorothiazide (ACCURETIC) 20-12.5 MG per tablet Take 1 tablet by mouth daily.    .  valACYclovir (VALTREX) 500 MG tablet TAKE 1 TABLET BY MOUTH EVERY DAY 30 tablet 0   Current Facility-Administered Medications  Medication Dose Route Frequency Provider Last Rate Last Dose  . acetaminophen (TYLENOL) tablet 975 mg  975 mg Oral Once Gay Filler Copland, MD      . ibuprofen (ADVIL,MOTRIN) tablet 400 mg  400 mg Oral Once Darreld Mclean, MD        Allergies:   Penicillins    Social History:  The patient  reports that she has never smoked. She has never used smokeless tobacco. She reports that she does not drink alcohol or use drugs.   Family History:  The patient's family history includes Diabetes in her brother; Hypertension in her brother; Ovarian cancer (age of onset: 81) in her mother.    ROS:  Please see the history of present illness.   Otherwise, review of systems are positive for none.   All other systems are reviewed and negative.    PHYSICAL EXAM: VS:  BP 100/80 (BP Location: Left Arm, Patient Position: Sitting, Cuff Size: Large)   Pulse (!) 110   Ht 5\' 3"  (1.6 m)   Wt 294 lb 2 oz (133.4 kg)   BMI 52.10 kg/m  , BMI Body mass index is 52.1 kg/m. GENERAL:  Well appearing HEENT:  Pupils equal round and reactive,  fundi not visualized, oral mucosa unremarkable NECK:  No jugular venous distention, waveform within normal limits, carotid upstroke brisk and symmetric, no bruits, no thyromegaly LYMPHATICS:  No cervical, inguinal adenopathy LUNGS:  Clear to auscultation bilaterally BACK:  No CVA tenderness CHEST:  Unremarkable HEART:  PMI not displaced or sustained,S1 and S2 within normal limits, no S3, no S4, no clicks, no rubs, no murmurs ABD:  Flat, positive bowel sounds normal in frequency in pitch, no bruits, no rebound, no guarding, no midline pulsatile mass, no hepatomegaly, no splenomegaly EXT:  2 plus pulses throughout, no edema, no cyanosis no clubbing SKIN:  No rashes no nodules NEURO:  Cranial nerves II through XII grossly intact, motor grossly intact  throughout PSYCH:  Cognitively intact, oriented to person place and time    EKG:  EKG is ordered today. The ekg ordered today demonstrates left bundle branch block, rate 110, no acute ST-T wave changes.   Recent Labs: No results found for requested labs within last 8760 hours.    Lipid Panel No results found for: CHOL, TRIG, HDL, CHOLHDL, VLDL, LDLCALC, LDLDIRECT    Wt Readings from Last 3 Encounters:  08/07/16 294 lb 2 oz (133.4 kg)  07/29/16 296 lb 3.2 oz (134.4 kg)  07/01/16 295 lb 1.6 oz (133.9 kg)      Other studies Reviewed: Additional studies/ records that were reviewed today include: Office records.  Old echo and EKG. Review of the above records demonstrates:  Please see elsewhere in the note.     ASSESSMENT AND PLAN:  LBBB:     Thisis chronic. She's not had evidence of structural heart disease. However, I'm going to follow up echo is below.   TACHYCARDIA:  I don't see a TSH on labs that I reviewed several ordered this. I'll also be checking the echo as above.   MR:  This was mild several years ago and check an echocardiogram.   PREOP:  The echo is normal patient has no high-risk findings. She has a high functional level. She's not going for a high-risk seizure. Therefore, based on ACC/AHA guidelines the patient would be at acceptable risk for the planned surgery again only if the echocardiogram and blood work is normal..   Current medicines are reviewed at length with the patient today.  The patient does not have concerns regarding medicines.  The following changes have been made:  no change  Labs/ tests ordered today include:  Orders Placed This Encounter  Procedures  . TSH  . ECHOCARDIOGRAM COMPLETE    Disposition:   FU with me as needed.      Signed, Minus Breeding, MD  08/07/2016 5:00 PM    [place]

## 2016-08-07 NOTE — Patient Instructions (Signed)
Medication Instructions:  Continue current medications  Labwork: TSH  Testing/Procedures: Your physician has requested that you have an echocardiogram. Echocardiography is a painless test that uses sound waves to create images of your heart. It provides your doctor with information about the size and shape of your heart and how well your heart's chambers and valves are working. This procedure takes approximately one hour. There are no restrictions for this procedure.  Follow-Up: Your physician recommends that you schedule a follow-up appointment in: As Needed   Any Other Special Instructions Will Be Listed Below (If Applicable).     If you need a refill on your cardiac medications before your next appointment, please call your pharmacy.

## 2016-08-14 ENCOUNTER — Ambulatory Visit (INDEPENDENT_AMBULATORY_CARE_PROVIDER_SITE_OTHER): Payer: PRIVATE HEALTH INSURANCE | Admitting: Psychiatry

## 2016-08-15 ENCOUNTER — Ambulatory Visit: Payer: 59 | Admitting: Cardiology

## 2016-08-20 ENCOUNTER — Ambulatory Visit (HOSPITAL_COMMUNITY): Payer: 59 | Attending: Cardiovascular Disease

## 2016-08-20 ENCOUNTER — Other Ambulatory Visit: Payer: Self-pay

## 2016-08-20 DIAGNOSIS — I10 Essential (primary) hypertension: Secondary | ICD-10-CM | POA: Insufficient documentation

## 2016-08-20 DIAGNOSIS — E669 Obesity, unspecified: Secondary | ICD-10-CM | POA: Insufficient documentation

## 2016-08-20 DIAGNOSIS — Z87891 Personal history of nicotine dependence: Secondary | ICD-10-CM | POA: Insufficient documentation

## 2016-08-20 DIAGNOSIS — I340 Nonrheumatic mitral (valve) insufficiency: Secondary | ICD-10-CM | POA: Insufficient documentation

## 2016-08-20 DIAGNOSIS — I447 Left bundle-branch block, unspecified: Secondary | ICD-10-CM | POA: Insufficient documentation

## 2016-08-20 DIAGNOSIS — Z6841 Body Mass Index (BMI) 40.0 and over, adult: Secondary | ICD-10-CM | POA: Diagnosis not present

## 2016-08-20 LAB — ECHOCARDIOGRAM COMPLETE
CHL CUP DOP CALC LVOT VTI: 22.9 cm
E decel time: 137 msec
E/e' ratio: 12.54
FS: 24 % — AB (ref 28–44)
IVS/LV PW RATIO, ED: 0.99
LA ID, A-P, ES: 36 mm
LA diam end sys: 36 mm
LA vol A4C: 55.4 ml
LADIAMINDEX: 1.58 cm/m2
LAVOL: 47.6 mL
LAVOLIN: 20.9 mL/m2
LV E/e' medial: 12.54
LV E/e'average: 12.54
LV PW d: 10 mm — AB (ref 0.6–1.1)
LV e' LATERAL: 9.25 cm/s
LVOT peak vel: 110 cm/s
Lateral S' vel: 13.1 cm/s
MV Dec: 137
MV Peak grad: 5 mmHg
MV pk A vel: 79.6 m/s
MVPKEVEL: 116 m/s
TDI e' lateral: 9.25
TDI e' medial: 7.4

## 2016-08-20 MED ORDER — PERFLUTREN LIPID MICROSPHERE
1.0000 mL | INTRAVENOUS | Status: AC | PRN
  Administered 2016-08-20: 2 mL via INTRAVENOUS

## 2016-08-25 ENCOUNTER — Telehealth: Payer: Self-pay | Admitting: Cardiology

## 2016-08-25 ENCOUNTER — Encounter: Payer: 59 | Admitting: Dietician

## 2016-08-25 ENCOUNTER — Encounter: Payer: 59 | Attending: General Surgery | Admitting: Dietician

## 2016-08-25 DIAGNOSIS — I10 Essential (primary) hypertension: Secondary | ICD-10-CM | POA: Insufficient documentation

## 2016-08-25 DIAGNOSIS — Z713 Dietary counseling and surveillance: Secondary | ICD-10-CM | POA: Insufficient documentation

## 2016-08-25 DIAGNOSIS — Z6841 Body Mass Index (BMI) 40.0 and over, adult: Secondary | ICD-10-CM | POA: Diagnosis not present

## 2016-08-25 DIAGNOSIS — R0683 Snoring: Secondary | ICD-10-CM | POA: Insufficient documentation

## 2016-08-25 DIAGNOSIS — M0579 Rheumatoid arthritis with rheumatoid factor of multiple sites without organ or systems involvement: Secondary | ICD-10-CM | POA: Insufficient documentation

## 2016-08-25 DIAGNOSIS — E669 Obesity, unspecified: Secondary | ICD-10-CM

## 2016-08-25 NOTE — Progress Notes (Signed)
  6 Months Supervised Weight Loss Visit:   Pre-Operative Sleeve Gastrectomy Surgery  Medical Nutrition Therapy:  Appt start time: [id number] end time: 1120  Primary concerns today: Supervised Weight Loss Visit. Returns with a 1 lb weight gain. Has been having some protein shakes and drinking more water. Attended  Pre Op class this morning. Has been doing more walking than before. Has been working on chewing well.   Tried [place] yogurt and did not like it. Having decaf coffee with no sugar!   Weight: 296.2 lbs BMI: 50.8  Preferred Learning Style:   No preference indicated   Learning Readiness:   Ready  24-hr recall: B (AM): poached egg with sausage Snk (AM): protein shakes L (PM): plums, apple, orange, grapes, strawberries Snk (PM): none  D (PM): protein shakes Snk (PM):   Beverages: water, decaf coffee, some tea  Medications: see list  Recent physical activity:  Isometric exercises, walking inside building each workday  Progress Towards Goal(s):  In progress.  Handouts given during visit include:  none   Nutritional Diagnosis:  [place]-3.3 Obesity related to past poor dietary habits and physical inactivity as evidenced by patient attending supervised weight loss for insurance approval of bariatric surgery.    Intervention:  Nutrition education/diet advancement. Continue to have protein with each meal or snack (cheese, egg, meat, yogurt, nuts, beans, cottage cheese, fish, protein shakes). Continue working on chewing well. Work on not drinking during meals.   Work on having smaller portions. Cut back on sweet tea.  Start taking a multivitamin and vitamin D.   Teaching Method Utilized:  Visual Auditory Hands on  Barriers to learning/adherence to lifestyle change: none  Demonstrated degree of understanding via:  Teach Back   Monitoring/Evaluation:  Dietary intake, exercise, and body weight. Follow up to attend Post Op Class

## 2016-08-25 NOTE — Patient Instructions (Addendum)
Continue to have protein with each meal or snack (cheese, egg, meat, yogurt, nuts, beans, cottage cheese, fish, protein shakes). Continue working on chewing well. Work on not drinking during meals.   Work on having smaller portions. Cut back on sweet tea.  Start taking a multivitamin and vitamin D.

## 2016-08-25 NOTE — Telephone Encounter (Signed)
Pt is aware of her Echo result

## 2016-08-25 NOTE — Progress Notes (Signed)
  Pre-Operative Nutrition Class:  Appt start time: 830   End time:  930.  Patient was seen on 08/25/2016 for Pre-Operative Bariatric Surgery Education [facility].   Surgery date:  Surgery type: Sleeve gastrectomy Start weight at [facility]: 295 lbs on 07/01/2016 Weight today: 295.8 lbs  TANITA  BODY COMP RESULTS  08/25/16   BMI (kg/m^2) 50.8   Fat Mass (lbs) 166.6   Fat Free Mass (lbs) 129.2   Total Body Water (lbs) 96.2   Samples given per MNT protocol. Patient educated on appropriate usage: Bariatric Advantage Multivitamin (mixed fruit - qty 1) Lot #: [id number] Exp: 05/2017  Bariatric Advantage Calcium Citrate chew (strawberry - qty 1) Lot #: [id number] Exp: 03/2017  Premier protein shake (strawberry - qty 1) Lot #: [id number] Exp: 07/2017  Renee Pain Protein Powder (chocolate splendor - qty 1) Lot #: [id number] Exp: 11/2017  The following the learning objectives were met by the patient during this course:  Identify Pre-Op Dietary Goals and will begin 2 weeks pre-operatively  Identify appropriate sources of fluids and proteins   State protein recommendations and appropriate sources pre and post-operatively  Identify Post-Operative Dietary Goals and will follow for 2 weeks post-operatively  Identify appropriate multivitamin and calcium sources  Describe the need for physical activity post-operatively and will follow MD recommendations  State when to call healthcare provider regarding medication questions or post-operative complications  Handouts given during class include:  Pre-Op Bariatric Surgery Diet Handout  Protein Shake Handout  Post-Op Bariatric Surgery Nutrition Handout  BELT Program Information Flyer  Support Group Information Flyer  [facility] Bariatric Supplements Price List  Follow-Up Plan: Patient will follow-up at [facility] 2 weeks post operatively for diet advancement per MD.

## 2016-08-25 NOTE — Telephone Encounter (Signed)
New message ° ° ° ° ° ° °Pt returning nurse call  °

## 2016-08-26 ENCOUNTER — Other Ambulatory Visit (HOSPITAL_BASED_OUTPATIENT_CLINIC_OR_DEPARTMENT_OTHER): Payer: Self-pay

## 2016-08-26 DIAGNOSIS — R0683 Snoring: Secondary | ICD-10-CM

## 2016-08-27 ENCOUNTER — Encounter: Payer: Self-pay | Admitting: Dietician

## 2016-09-01 ENCOUNTER — Ambulatory Visit (HOSPITAL_BASED_OUTPATIENT_CLINIC_OR_DEPARTMENT_OTHER): Payer: 59 | Attending: General Surgery | Admitting: Internal Medicine

## 2016-09-01 DIAGNOSIS — G4733 Obstructive sleep apnea (adult) (pediatric): Secondary | ICD-10-CM | POA: Insufficient documentation

## 2016-09-01 DIAGNOSIS — G4736 Sleep related hypoventilation in conditions classified elsewhere: Secondary | ICD-10-CM | POA: Insufficient documentation

## 2016-09-01 DIAGNOSIS — R0683 Snoring: Secondary | ICD-10-CM

## 2016-09-06 DIAGNOSIS — R0683 Snoring: Secondary | ICD-10-CM

## 2016-09-06 NOTE — Procedures (Signed)
   Patient Name: [name] [name], [name] [name] Date: 09/02/2016 Gender: Female D.O.B: 04/04/1961 Age (years): 55 Referring Provider: Greer Pickerel Height (inches): 5 Interpreting Physician: Baird Lyons MD, ABSM Weight (lbs): 297 RPSGT: Jacolyn Reedy BMI: 53 MRN: [id number] Neck Size: 16.00 CLINICAL INFORMATION Sleep Study Type: unattended HST Indication for sleep study: Snoring Epworth Sleepiness Score: 3  SLEEP STUDY TECHNIQUE A multi-channel overnight portable sleep study was performed. The channels recorded were: nasal airflow, thoracic respiratory movement, and oxygen saturation with a pulse oximetry. Snoring was also monitored.  MEDICATIONS Patient self administered medications include: none reported during sleep study .  SLEEP ARCHITECTURE Patient was studied for 326.9 minutes. The sleep efficiency was 91.9 % and the patient was supine for 28%. The arousal index was 0.0 per hour.  RESPIRATORY PARAMETERS The overall AHI was 16.0 per hour, with a central apnea index of 0.0 per hour. The oxygen nadir was 62% during sleep.  CARDIAC DATA Mean heart rate during sleep was 108.4 bpm.  IMPRESSIONS - Moderate obstructive sleep apnea occurred during this study (AHI = 16.0/h). - No significant central sleep apnea occurred during this study (CAI = 0.0/h). - Severe oxygen desaturation was noted during this study (Min O2 = 62%). - Patient snored .  DIAGNOSIS - Obstructive Sleep Apnea (327.23 [G47.33 ICD-10]) - Nocturnal Hypoxemia (327.26 [G47.36 ICD-10])  RECOMMENDATIONS - Recommend CPAP titration. Discuss options with provider. - Avoid alcohol, sedatives and other CNS depressants that may worsen sleep apnea and disrupt normal sleep architecture. - Sleep hygiene should be reviewed to assess factors that may improve sleep quality. - Weight management and regular exercise should be initiated or continued.  [Electronically signed] 09/06/2016 10:36 AM  Baird Lyons MD,  ABSM Diplomate, American Board of Sleep Medicine   NPI: [id number] [place], American Board of Sleep Medicine  ELECTRONICALLY SIGNED ON:  09/06/2016, 10:37 AM [place] PH: [phone]   FX: [phone] [place]

## 2016-09-09 ENCOUNTER — Telehealth: Payer: Self-pay

## 2016-09-10 ENCOUNTER — Ambulatory Visit: Payer: Self-pay | Admitting: General Surgery

## 2016-09-10 NOTE — Progress Notes (Signed)
Scheduling pre op- please place SURGICAL ORDERS IN [facility]  Thanks

## 2016-09-10 NOTE — Telephone Encounter (Signed)
Spoke with [name] [name] with Dr.Wilson's office in regards to getting pt scheduled for upcoming Bariatric surgery. Pt scheduled to see SG on 09-18-16 @ 11:00am LM for pt to make her aware. Nothing further needed.

## 2016-09-10 NOTE — Telephone Encounter (Signed)
LM for [name] [name]. Will await call back

## 2016-09-18 ENCOUNTER — Ambulatory Visit: Payer: Self-pay | Admitting: Acute Care

## 2016-09-18 ENCOUNTER — Ambulatory Visit (INDEPENDENT_AMBULATORY_CARE_PROVIDER_SITE_OTHER): Payer: 59 | Admitting: Internal Medicine

## 2016-09-18 ENCOUNTER — Encounter: Payer: Self-pay | Admitting: Internal Medicine

## 2016-09-18 VITALS — BP 128/76 | HR 89 | Ht 63.0 in | Wt 292.0 lb

## 2016-09-18 DIAGNOSIS — Z01818 Encounter for other preprocedural examination: Secondary | ICD-10-CM

## 2016-09-18 DIAGNOSIS — G4733 Obstructive sleep apnea (adult) (pediatric): Secondary | ICD-10-CM

## 2016-09-18 NOTE — Progress Notes (Signed)
09/18/2016-55 year old female referred courtesy of Dr. Greer Pickerel for OSA, anticipating bariatric surgery for morbid obesity, Medical problems include Crohn's, HBP, LBBB Unattended Home Sleep Test 09/02/2016- AHI 16.0/hour, desaturation to 62%, body weight 297 pounds. CXR 07/02/2016-NAD Pulmonary clearance  for wt loss surgery scheduled for 09-29-16 per Dr Greer Pickerel. Will need CPAP in place for surgery Husband uses CPAP-she is used to the idea. History of loud snoring especially as her weight went above 250 pounds. Admits to daytime sleepiness, occasional nap. One cup of coffee daily. No ENT surgery and denies history of lung or heart disease. Quit smoking 25 years ago. Denies routine cough or wheeze. Office Spirometry 09/18/2016-mild restriction of exhaled volume-FVC 1.97/75%, FEV1 1.60/77%, FEV1/FVC 0.81, FEF 25-75 percent 1.75/83%.  Prior to Admission medications   Medication Sig Start Date End Date Taking? Authorizing Provider  albuterol (PROVENTIL HFA;VENTOLIN HFA) 108 (90 Base) MCG/ACT inhaler Inhale into the lungs every 6 (six) hours as needed for wheezing or shortness of breath.   Yes Historical Provider, MD  fluticasone (FLONASE) 50 MCG/ACT nasal spray INHALE 1 SPRAY INTO EACH NOSTRIL ONCE DAILY 05/11/15  Yes Historical Provider, MD  folic acid (FOLVITE) 1 MG tablet Take 1 tablet by mouth daily. 08/05/16  Yes Historical Provider, MD  leflunomide (ARAVA) 10 MG tablet  05/05/16  Yes Historical Provider, MD  quinapril-hydrochlorothiazide (ACCURETIC) 20-12.5 MG per tablet Take 1 tablet by mouth daily.   Yes Historical Provider, MD  valACYclovir (VALTREX) 500 MG tablet TAKE 1 TABLET BY MOUTH EVERY DAY 06/14/15  Yes Thao P Le, DO   Past Medical History:  Diagnosis Date  . Diabetes mellitus without complication ([place])    pre diabetes  . Hypertension   . Lumbar herniated disc   . Rheumatoid arthritis [facility])    Past Surgical History:  Procedure Laterality Date  . BREAST SURGERY    . JOINT  REPLACEMENT     L TKR   Family History  Problem Relation Age of Onset  . Ovarian cancer Mother 9    Not sure of this diagnosis  . Diabetes Brother   . Hypertension Brother    Social History   Social History  . Marital status: Married    Spouse name: N/A  . Number of children: N/A  . Years of education: N/A   Occupational History  . Customer Service    Social History Main Topics  . Smoking status: Never Smoker  . Smokeless tobacco: Never Used  . Alcohol use No  . Drug use: No  . Sexual activity: Yes    Birth control/ protection: None   Other Topics Concern  . Not on file   Social History Narrative  . No narrative on file   ROS-see HPI   Negative unless "+" Constitutional:    weight loss, night sweats, fevers, chills, + fatigue, lassitude. HEENT:    headaches, difficulty swallowing, tooth/dental problems, sore throat,       sneezing, itching, ear ache, nasal congestion, post nasal drip, snoring CV:    chest pain, orthopnea, PND, + swelling in lower extremities, anasarca,                                                         dizziness, palpitations Resp:  + shortness of breath with exertion or at rest.  productive cough,   non-productive cough, coughing up of blood.              change in color of mucus.  wheezing.   Skin:    rash or lesions. GI:  No-   heartburn, indigestion, abdominal pain, nausea, vomiting, diarrhea,                 change in bowel habits, loss of appetite GU: dysuria, change in color of urine, no urgency or frequency.   flank pain. MS:   joint pain, stiffness, decreased range of motion, back pain. Neuro-     nothing unusual Psych:  change in mood or affect.  depression or anxiety.   memory loss.  OBJ- Physical Exam General- Alert, Oriented, Affect-appropriate, Distress- none acute, + morbidly obese Skin- rash-none, lesions- none, excoriation- none Lymphadenopathy- none Head- atraumatic            Eyes- Gross vision intact,  PERRLA, conjunctivae and secretions clear            Ears- Hearing, canals-normal            Nose- Clear, no-Septal dev, mucus, polyps, erosion, perforation             Throat- Mallampati III-IV , mucosa clear , drainage- none, tonsils- atrophic Neck- flexible , trachea midline, no stridor , thyroid nl, carotid no bruit Chest - symmetrical excursion , unlabored           Heart/CV- RRR , no murmur , no gallop  , no rub, nl s1 s2                           - JVD- none , edema- none, stasis changes- none, varices- none           Lung- clear to P&A, wheeze- none, cough- none , dullness-none, rub- none           Chest wall-  Abd-  Br/ Gen/ Rectal- Not done, not indicated Extrem- cyanosis- none, clubbing, none, atrophy- none, strength- nl Neuro- grossly intact to observation

## 2016-09-18 NOTE — Patient Instructions (Signed)
Order- new CPAP, new DME ( use husband's), auto 5-20, mask of choice, humidifier, supplies, AirView   Dx OSA  Please call as needed

## 2016-09-18 NOTE — Assessment & Plan Note (Signed)
Anticipating bariatric surgery. Successful weight loss should make significant improvement in her medical welfare.

## 2016-09-18 NOTE — Assessment & Plan Note (Signed)
We discussed her sleep study, medical issues of OSA, sleep hygiene, responsibility to drive safely, importance of weight control, treatment options. She is familiar with CPAP because her husband uses it. Plan-begin CPAP with auto titration. She will take her mask to surgery and we expect respiratory therapy to be able to place CPAP/BiPAP auto for recovery and sleep after surgery. We will schedule office return here for follow-up.

## 2016-09-23 NOTE — Patient Instructions (Signed)
[name] [name]  09/23/2016   Your procedure is scheduled on: 09/29/2016    Report to [facility]  Entrance take [place]  elevators to 3rd floor to  [place] at   [place] AM.  Call this number if you have problems the morning of surgery [phone]   Remember: ONLY 1 PERSON MAY GO WITH YOU TO SHORT STAY TO GET  READY MORNING OF YOUR SURGERY.  Do not eat food or drink liquids :After Midnight.     Take these medicines the morning of surgery with A SIP OF WATER: Albuterol Inhaler if needed and bring, Flonase                                You may not have any metal on your body including hair pins and              piercings  Do not wear jewelry, make-up, lotions, powders or perfumes, deodorant             Do not wear nail polish.  Do not shave  48 hours prior to surgery.                 Do not bring valuables to the hospital. [place].  Contacts, dentures or bridgework may not be worn into surgery.  Leave suitcase in the car. After surgery it may be brought to your room.       Special Instructions: N/A              Please read over the following fact sheets you were given: _____________________________________________________________________             [facility] - Preparing for Surgery Before surgery, you can play an important role.  Because skin is not sterile, your skin needs to be as free of germs as possible.  You can reduce the number of germs on your skin by washing with CHG (chlorahexidine gluconate) soap before surgery.  CHG is an antiseptic cleaner which kills germs and bonds with the skin to continue killing germs even after washing. Please DO NOT use if you have an allergy to CHG or antibacterial soaps.  If your skin becomes reddened/irritated stop using the CHG and inform your nurse when you arrive at Short Stay. Do not shave (including legs and underarms) for at least 48 hours  prior to the first CHG shower.  You may shave your face/neck. Please follow these instructions carefully:  1.  Shower with CHG Soap the night before surgery and the  morning of Surgery.  2.  If you choose to wash your hair, wash your hair first as usual with your  normal  shampoo.  3.  After you shampoo, rinse your hair and body thoroughly to remove the  shampoo.                           4.  Use CHG as you would any other liquid soap.  You can apply chg directly  to the skin and wash                       Gently with a scrungie or  clean washcloth.  5.  Apply the CHG Soap to your body ONLY FROM THE NECK DOWN.   Do not use on face/ open                           Wound or open sores. Avoid contact with eyes, ears mouth and genitals (private parts).                       Wash face,  Genitals (private parts) with your normal soap.             6.  Wash thoroughly, paying special attention to the area where your surgery  will be performed.  7.  Thoroughly rinse your body with warm water from the neck down.  8.  DO NOT shower/wash with your normal soap after using and rinsing off  the CHG Soap.                9.  Pat yourself dry with a clean towel.            10.  Wear clean pajamas.            11.  Place clean sheets on your bed the night of your first shower and do not  sleep with pets. Day of Surgery : Do not apply any lotions/deodorants the morning of surgery.  Please wear clean clothes to the hospital/surgery center.  FAILURE TO FOLLOW THESE INSTRUCTIONS MAY RESULT IN THE CANCELLATION OF YOUR SURGERY PATIENT SIGNATURE_________________________________  NURSE SIGNATURE__________________________________  ________________________________________________________________________

## 2016-09-25 ENCOUNTER — Encounter (HOSPITAL_COMMUNITY)
Admission: RE | Admit: 2016-09-25 | Discharge: 2016-09-25 | Disposition: A | Discharge status: Home / Self Care | Payer: 59 | Source: Ambulatory Visit | Attending: General Surgery | Admitting: General Surgery

## 2016-09-25 ENCOUNTER — Encounter (HOSPITAL_COMMUNITY): Payer: Self-pay

## 2016-09-25 DIAGNOSIS — Z01818 Encounter for other preprocedural examination: Secondary | ICD-10-CM | POA: Diagnosis not present

## 2016-09-25 HISTORY — DX: Sleep apnea, unspecified: G47.30

## 2016-09-25 HISTORY — DX: Prediabetes: R73.03

## 2016-09-25 HISTORY — DX: Unspecified asthma, uncomplicated: J45.909

## 2016-09-25 LAB — COMPREHENSIVE METABOLIC PANEL
ALK PHOS: 81 U/L (ref 38–126)
ALT: 23 U/L (ref 14–54)
AST: 24 U/L (ref 15–41)
Albumin: 3.9 g/dL (ref 3.5–5.0)
Anion gap: 9 (ref 5–15)
BILIRUBIN TOTAL: 0.3 mg/dL (ref 0.3–1.2)
BUN: 20 mg/dL (ref 6–20)
CALCIUM: 9.6 mg/dL (ref 8.9–10.3)
CO2: 30 mmol/L (ref 22–32)
CREATININE: 0.64 mg/dL (ref 0.44–1.00)
Chloride: 99 mmol/L — ABNORMAL LOW (ref 101–111)
GFR calc non Af Amer: >60 mL/min (ref >60)
Glucose, Bld: 98 mg/dL (ref 65–99)
Potassium: 3.2 mmol/L — ABNORMAL LOW (ref 3.5–5.1)
SODIUM: 138 mmol/L (ref 135–145)
Total Protein: 7.8 g/dL (ref 6.5–8.1)

## 2016-09-25 LAB — CBC WITH DIFFERENTIAL/PLATELET
BASOS ABS: 0 10*3/uL (ref 0.0–0.1)
Basophils Relative: 0 %
EOS PCT: 2 %
Eosinophils Absolute: 0.1 10*3/uL (ref 0.0–0.7)
HCT: 37.6 % (ref 36.0–46.0)
HEMOGLOBIN: 12.1 g/dL (ref 12.0–15.0)
LYMPHS PCT: 21 %
Lymphs Abs: 1.3 10*3/uL (ref 0.7–4.0)
MCH: 27.5 pg (ref 26.0–34.0)
MCHC: 32.2 g/dL (ref 30.0–36.0)
MCV: 85.5 fL (ref 78.0–100.0)
Monocytes Absolute: 0.5 10*3/uL (ref 0.1–1.0)
Monocytes Relative: 8 %
NEUTROS PCT: 69 %
Neutro Abs: 4.1 10*3/uL (ref 1.7–7.7)
PLATELETS: 292 10*3/uL (ref 150–400)
RBC: 4.4 MIL/uL (ref 3.87–5.11)
RDW: 14.5 % (ref 11.5–15.5)
WBC: 6 10*3/uL (ref 4.0–10.5)

## 2016-09-25 LAB — VITAMIN B12: Vitamin B-12: 602 pg/mL (ref 180–914)

## 2016-09-25 NOTE — Progress Notes (Signed)
CMP done 09/25/16 faxed via EPIC to Dr Greer Pickerel.

## 2016-09-25 NOTE — Progress Notes (Signed)
EKG-08/07/16- EPIC  ECHO-08/20/16- EPIC  CXR- 07/02/16- EPIC  09/18/16- LOV- pulm-epic  08/07/16- LOV- Cardiology- epic

## 2016-09-26 LAB — HEMOGLOBIN A1C
Hgb A1c MFr Bld: 6.2 % — ABNORMAL HIGH (ref 4.8–5.6)
Mean Plasma Glucose: 131 mg/dL

## 2016-09-26 LAB — VITAMIN D 25 HYDROXY (VIT D DEFICIENCY, FRACTURES): Vit D, 25-Hydroxy: 17.1 ng/mL — ABNORMAL LOW (ref 30.0–100.0)

## 2016-09-26 NOTE — Progress Notes (Signed)
Vit D done 09/25/16 faxed via EPIC to DR Greer Pickerel.

## 2016-09-28 NOTE — H&P (Signed)
[name] [name] 09/26/2016 3:45 PM Location: [facility] Patient #: [id number] DOB: 1961/10/15 Married / Language: English / Race: Black or African American Female  History of Present Illness [name] [name] M. [name] Peterka [name]; 09/28/2016 8:19 PM) The patient is a 55 year old female who presents for a bariatric surgery evaluation. She comes in today for her preoperative visit. She has been scheduled and approved for laparoscopic sleeve gastrectomy with possible hiatal hernia repair. She denies any major changes since she was seen. She did see one of our partners during the summer for pilonidal issues. She has had chronic problems with pilonidal cyst. Otherwise she denies any chest pain, chest pressure, arm or neck pain, shortness of breath, dyspnea on exertion. She saw the cardiologist in September and had a normal echocardiogram and was deemed acceptable risk for surgery. She had an upper GI that showed a [name] hiatal hernia. Hemoglobin A1c was 6.2. Vitamin D level was 17. Otherwise bariatric evaluation labs were within normal limits. She does not smoke. She has occasional reflux. She had an MRI of her lower lumbar back which showed degenerative disc disease. She had a sleep study that showed obstructive sleep apnea. She has seen a pulmonologist and has been set up and arrange to get CPAP.  She is not taking methotrexate.  02/15/2016 She is referred by [name] [name] to discuss weight loss surgery. [name] [name] is her rheumatologist. She is interested in sleeve gastrectomy. Initially a physician recommended that. After doing additional research and attending our seminar in person she believes that procedure would best fit her lifestyle. She also has had several acquaintances who have had a sleeve gastrectomy in [place] who have done well.  Her comorbidities include hypertension, prediabetes, rheumatoid arthritis, elevated epworth sleepiness scale score of 14  She states that  she has been dealing with her weight her entire life. Despite numerous attempts for sustained weight loss she has been unsuccessful. She has tried Weight Watchers, bariatric clinic, Nutrisystem, Slim fast, phentermine-all without any long-term success. Over the past 12 months she has discussed her weight & her comorbidities with her primary care physician  She denies any chest pain, chest pressure, short of breath, dyspnea on exertion, orthopnea, or paroxysmal nocturnal dyspnea. However she states that she sleeps on a lot pillows because of snoring. She also has a fair amount of sinus drainage. She does have some bilateral ankle edema at times. She denies any prior blood clots. She denies any first-degree relatives with blood clots. She denies any heartburn or reflux. She denies any difficulty drinking liquids or eating solids. She denies any abdominal surgery. She has had a colonoscopy and had benign polyps. There is no evidence of inflammatory bowel disease. Her bowel movement frequency depends on her food choices. She is a G1 P1. She denies any dysuria, hematuria. She has bilateral ankle and knee pain and left hip pain. She is on methotrexate for rheumatoid arthritis. She was recently told that she is prediabetic. She denies any TIAs or amaurosis fugax. She denies any lightheadedness or dizziness. She quit smoking 30 years ago. She doesn't drink any alcohol on a regular basis. She works in Therapist, art.  She had a recent physical with labs.   Problem List/Past Medical [name] [name] [name] Derby, [name]; 09/28/2016 8:18 PM) OSA (OBSTRUCTIVE SLEEP APNEA) (G47.33) PREDIABETES (R73.03) a1c 6.2 SNORING (R06.83) RHEUMATOID ARTHRITIS INVOLVING MULTIPLE SITES WITH POSITIVE RHEUMATOID FACTOR (M05.79) PILONIDAL CYST (L05.91) HIATAL HERNIA (K44.9) VITAMIN D DEFICIENCY (E55.9) OBESITY, MORBID, BMI 40.0-49.9 (  E66.01)  Other Problems [name] [name]; 09/28/2016 8:18 PM) BENIGN ESSENTIAL  HTN (I10) Arthritis Back Pain  Past Surgical History [name] [name]; 09/28/2016 8:18 PM) Colon Polyp Removal - Colonoscopy Colon Removal - Complete Knee Surgery Left. Anal Fissure Repair Mammoplasty; Reduction Bilateral.  Diagnostic Studies History [name] [name]; 09/28/2016 8:18 PM) Colonoscopy within last year Mammogram within last year Pap Smear 1-5 years ago  Allergies ([name] [name]; 09/26/2016 4:21 PM) Penicillin V *PENICILLINS* Hives.  Medication History [name] [name]; 09/28/2016 8:18 PM) Hydrocodone-Acetaminophen (5-325MG  Tablet, Oral as needed) Active. Folic Acid (1MG  Tablet, Oral) Active. Methotrexate Sodium (2.5MG  Tablet, Oral) Active. Valtrex (1GM Tablet, Oral) Active. Quinapril-Hydrochlorothiazide (20-25MG  Tablet, Oral) Active. Medications Reconciled OxyCODONE HCl (5MG /5ML Solution, 5-10 Milliliter Oral every four hours, as needed, Taken starting 09/26/2016) Active. Protonix (40MG  Tablet [name], 1 (one) Tablet Oral daily, Taken starting 09/26/2016) Active. Zofran ODT (4MG  Tablet Disint, 1 (one) Tablet Disperse Oral every six hours, as needed, Taken starting 09/26/2016) Active.  Social History [name] [name]; 09/28/2016 8:18 PM) No alcohol use No drug use Tobacco use Former smoker. Caffeine use Coffee.  Family History [name] [name]; 09/28/2016 8:18 PM) Hypertension Father, Mother.  Pregnancy / Birth History [name] [name]; 09/28/2016 8:18 PM) Para 1 Maternal age 31-25 Age of menopause 34-55 Gravida 1 Irregular periods Age at menarche 13 years.     Review of Systems [name] [name] M. Alakai Macbride [name]; 09/28/2016 8:09 PM) General Present- Fatigue and Weight Gain. Not Present- Appetite Loss, Chills, Fever, Night Sweats and Weight Loss. Skin Not Present- Change in Wart/Mole, Dryness, Hives, Jaundice, New Lesions, Non-Healing Wounds, Rash and Ulcer. HEENT Present- Sinus Pain and Wears glasses/contact lenses. Not Present-  Earache, Hearing Loss, Hoarseness, Nose Bleed, Oral Ulcers, Ringing in the Ears, Seasonal Allergies, Sore Throat, Visual Disturbances and Yellow Eyes. Respiratory Present- Snoring and Wheezing. Not Present- Bloody sputum, Chronic Cough and Difficulty Breathing. Breast Not Present- Breast Mass, Breast Pain, Nipple Discharge and Skin Changes. Cardiovascular Present- Leg Cramps and Swelling of Extremities. Not Present- Chest Pain, Difficulty Breathing Lying Down, Palpitations, Rapid Heart Rate and Shortness of Breath. Gastrointestinal Not Present- Abdominal Pain, Bloating, Bloody Stool, Change in Bowel Habits, Chronic diarrhea, Constipation, Difficulty Swallowing, Excessive gas, Gets full quickly at meals, Hemorrhoids, Indigestion, Nausea, Rectal Pain and Vomiting. Female Genitourinary Not Present- Frequency, Nocturia, Painful Urination, Pelvic Pain and Urgency. Musculoskeletal Present- Back Pain, Joint Pain, Joint Stiffness, Muscle Pain, Muscle Weakness and Swelling of Extremities. Neurological Present- Headaches, Tingling, Trouble walking and Weakness. Not Present- Decreased Memory, Fainting, Numbness, Seizures and Tremor. Psychiatric Present- Anxiety, Change in Sleep Pattern and Depression. Not Present- Bipolar, Fearful and Frequent crying. Endocrine Present- New Diabetes. Not Present- Cold Intolerance, Excessive Hunger, Hair Changes, Heat Intolerance and Hot flashes. Hematology Not Present- Easy Bruising, Excessive bleeding, Gland problems, HIV and Persistent Infections.  Vitals ([name] [name] [name]; 09/26/2016 4:22 PM) 09/26/2016 4:21 PM Weight: 287 lb Height: 64in Body Surface Area: 2.28 m Body Mass Index: 49.26 kg/m  Temp.: 98.54F(Oral)  Pulse: 100 (Regular)  BP: 94/74 (Sitting, Left Arm, Standard)      Physical Exam [name] [name] M. Legrand Lasser [name]; 09/28/2016 8:09 PM)  General Mental Status-Alert. General Appearance-Consistent with stated age. Hydration-Well  hydrated. Voice-Normal. Note: morbidly obese; impressive b/l knee crepitus  Integumentary Note: chronic pilondial disease but no active infection  Head and Neck Head-normocephalic, atraumatic with no lesions or palpable masses. Trachea-midline. Thyroid Gland Characteristics - normal size and consistency.  Eye Eyeball - Bilateral-Extraocular  movements intact. Sclera/Conjunctiva - Bilateral-No scleral icterus.  Chest and Lung Exam Chest and lung exam reveals -quiet, even and easy respiratory effort with no use of accessory muscles and on auscultation, normal breath sounds, no adventitious sounds and normal vocal resonance. Inspection Chest Wall - Normal. Back - normal.  Breast - Did not examine.  Cardiovascular Cardiovascular examination reveals -normal heart sounds, regular rate and rhythm with no murmurs and normal pedal pulses bilaterally.  Abdomen Inspection Inspection of the abdomen reveals - No Hernias. Skin - Scar - no surgical scars. Palpation/Percussion Palpation and Percussion of the abdomen reveal - Soft, Non Tender, No Rebound tenderness, No Rigidity (guarding) and No hepatosplenomegaly. Auscultation Auscultation of the abdomen reveals - Bowel sounds normal.  Peripheral Vascular Upper Extremity Palpation - Pulses bilaterally normal.  Neurologic Neurologic evaluation reveals -alert and oriented x 3 with no impairment of recent or remote memory. Mental Status-Normal.  Neuropsychiatric The patient's mood and affect are described as -normal. Judgment and Insight-insight is appropriate concerning matters relevant to self.  Musculoskeletal Normal Exam - Left-Upper Extremity Strength Normal and Lower Extremity Strength Normal. Normal Exam - Right-Upper Extremity Strength Normal and Lower Extremity Strength Normal.  Lymphatic Head & Neck  General Head & Neck Lymphatics: Bilateral - Description - Normal. Axillary - Did not  examine. Femoral & Inguinal - Did not examine.    Assessment & Plan [name] [name] M. Eugene Zeiders [name]; 09/28/2016 8:18 PM)  OBESITY, MORBID, BMI 40.0-49.9 (E66.01) Impression: We reviewed her preoperative workup including discussing her upper GI. We discussed the findings of a [name] hiatal hernia. We discussed that we would test for this intraoperatively and found to be clinically significant than I would recommend proceeding with repair during surgery. We discussed what repair would involve along with the risk and benefits. We reviewed her obstructive sleep apnea and I encouraged her to use the CPAP. He also discussed her vitamin D deficiency and her prediabetes. She was given her postoperative pain prescription and nausea prescription today. We rediscussed the typical hospital course as well as the typical recovery. All of her questions were asked and answered. We discussed the importance of the preoperative diet.  Current Plans Started OxyCODONE HCl 5MG /5ML, 5-10 Milliliter every four hours, as needed, 150 Milliliter, 09/26/2016, No Refill. Started Protonix 40MG , 1 (one) Tablet daily, #30, 30 days starting 09/26/2016, Ref. x3. Started Zofran ODT 4MG , 1 (one) Tablet Disperse every six hours, as needed, #20, 09/26/2016, No Refill. Pt Education - EMW_preopbariatric OSA (OBSTRUCTIVE SLEEP APNEA) (G47.33) Impression: managed by pulm  SNORING (R06.83)  PREDIABETES (R73.03) Story: a1c 6.2  BENIGN ESSENTIAL HTN (I10)  VITAMIN D DEFICIENCY (E55.9)  HIATAL HERNIA (K44.9) Impression: see above  Leighton Ruff. Redmond Pulling, [name], FACS General, Bariatric, & Minimally Invasive Surgery [facility], [place]

## 2016-09-29 ENCOUNTER — Inpatient Hospital Stay (HOSPITAL_COMMUNITY): Payer: 59 | Admitting: Certified Registered Nurse Anesthetist

## 2016-09-29 ENCOUNTER — Inpatient Hospital Stay (HOSPITAL_COMMUNITY)
Admission: RE | Admit: 2016-09-29 | Discharge: 2016-09-30 | DRG: 621 | Disposition: A | Discharge status: Home / Self Care | Payer: 59 | Source: Ambulatory Visit | Attending: General Surgery | Admitting: General Surgery

## 2016-09-29 ENCOUNTER — Encounter (HOSPITAL_COMMUNITY)
Admission: RE | Disposition: A | Discharge status: Home / Self Care | Payer: Self-pay | Source: Ambulatory Visit | Attending: General Surgery

## 2016-09-29 ENCOUNTER — Encounter (HOSPITAL_COMMUNITY): Payer: Self-pay | Admitting: *Deleted

## 2016-09-29 DIAGNOSIS — M069 Rheumatoid arthritis, unspecified: Secondary | ICD-10-CM | POA: Diagnosis present

## 2016-09-29 DIAGNOSIS — M5136 Other intervertebral disc degeneration, lumbar region: Secondary | ICD-10-CM | POA: Diagnosis present

## 2016-09-29 DIAGNOSIS — Z6841 Body Mass Index (BMI) 40.0 and over, adult: Secondary | ICD-10-CM

## 2016-09-29 DIAGNOSIS — Z79899 Other long term (current) drug therapy: Secondary | ICD-10-CM | POA: Diagnosis not present

## 2016-09-29 DIAGNOSIS — Z87891 Personal history of nicotine dependence: Secondary | ICD-10-CM

## 2016-09-29 DIAGNOSIS — Z9884 Bariatric surgery status: Secondary | ICD-10-CM

## 2016-09-29 DIAGNOSIS — R7303 Prediabetes: Secondary | ICD-10-CM | POA: Diagnosis present

## 2016-09-29 DIAGNOSIS — E559 Vitamin D deficiency, unspecified: Secondary | ICD-10-CM | POA: Diagnosis present

## 2016-09-29 DIAGNOSIS — G4733 Obstructive sleep apnea (adult) (pediatric): Secondary | ICD-10-CM | POA: Diagnosis present

## 2016-09-29 DIAGNOSIS — K219 Gastro-esophageal reflux disease without esophagitis: Secondary | ICD-10-CM | POA: Diagnosis present

## 2016-09-29 DIAGNOSIS — I10 Essential (primary) hypertension: Secondary | ICD-10-CM | POA: Diagnosis present

## 2016-09-29 HISTORY — PX: UPPER GI ENDOSCOPY: SHX6162

## 2016-09-29 HISTORY — PX: LAPAROSCOPIC GASTRIC SLEEVE RESECTION: SHX5895

## 2016-09-29 LAB — HEMOGLOBIN AND HEMATOCRIT, BLOOD
HEMATOCRIT: 35.8 % — AB (ref 36.0–46.0)
HEMOGLOBIN: 11.7 g/dL — AB (ref 12.0–15.0)

## 2016-09-29 SURGERY — GASTRECTOMY, SLEEVE, LAPAROSCOPIC
Anesthesia: General | Site: Abdomen

## 2016-09-29 MED ORDER — LIDOCAINE 2% (20 MG/ML) 5 ML SYRINGE
INTRAMUSCULAR | Status: AC
  Filled 2016-09-29: qty 5

## 2016-09-29 MED ORDER — PROMETHAZINE HCL 25 MG/ML IJ SOLN
12.5000 mg | Freq: Four times a day (QID) | INTRAMUSCULAR | Status: DC | PRN
Start: 2016-09-29 — End: 2016-09-30
  Administered 2016-09-29: 12.5 mg via INTRAVENOUS
  Filled 2016-09-29: qty 1

## 2016-09-29 MED ORDER — HYDROMORPHONE HCL 1 MG/ML IJ SOLN
INTRAMUSCULAR | Status: AC
  Administered 2016-09-29: 0.25 mg via INTRAVENOUS
  Filled 2016-09-29: qty 1

## 2016-09-29 MED ORDER — LEVOFLOXACIN IN D5W 750 MG/150ML IV SOLN
750.0000 mg | INTRAVENOUS | Status: AC
  Administered 2016-09-29: 750 mg via INTRAVENOUS
  Filled 2016-09-29: qty 150

## 2016-09-29 MED ORDER — PROPOFOL 10 MG/ML IV BOLUS
INTRAVENOUS | Status: AC
  Filled 2016-09-29: qty 20

## 2016-09-29 MED ORDER — ONDANSETRON HCL 4 MG/2ML IJ SOLN
4.0000 mg | Freq: Four times a day (QID) | INTRAMUSCULAR | Status: DC
  Administered 2016-09-29 – 2016-09-30 (×4): 4 mg via INTRAVENOUS
  Filled 2016-09-29 (×5): qty 2

## 2016-09-29 MED ORDER — FENTANYL CITRATE (PF) 250 MCG/5ML IJ SOLN
INTRAMUSCULAR | Status: AC
  Filled 2016-09-29: qty 5

## 2016-09-29 MED ORDER — SCOPOLAMINE 1 MG/3DAYS TD PT72
MEDICATED_PATCH | TRANSDERMAL | Status: DC | PRN
  Administered 2016-09-29: 1 via TRANSDERMAL

## 2016-09-29 MED ORDER — BUPIVACAINE LIPOSOME 1.3 % IJ SUSP
20.0000 mL | Freq: Once | INTRAMUSCULAR | Status: DC
  Filled 2016-09-29: qty 20

## 2016-09-29 MED ORDER — ACETAMINOPHEN 160 MG/5ML PO SOLN: 325.0000 mg | ORAL | Status: DC | PRN

## 2016-09-29 MED ORDER — DIPHENHYDRAMINE HCL 50 MG/ML IJ SOLN: 12.5000 mg | Freq: Three times a day (TID) | INTRAMUSCULAR | Status: DC | PRN

## 2016-09-29 MED ORDER — ALBUTEROL SULFATE (2.5 MG/3ML) 0.083% IN NEBU: 3.0000 mL | INHALATION_SOLUTION | Freq: Four times a day (QID) | RESPIRATORY_TRACT | Status: DC | PRN

## 2016-09-29 MED ORDER — LACTATED RINGERS IR SOLN
Status: DC | PRN
  Administered 2016-09-29: 3000 mL

## 2016-09-29 MED ORDER — PHENYLEPHRINE 40 MCG/ML (10ML) SYRINGE FOR IV PUSH (FOR BLOOD PRESSURE SUPPORT)
PREFILLED_SYRINGE | INTRAVENOUS | Status: DC | PRN
  Administered 2016-09-29 (×3): 80 ug via INTRAVENOUS
  Administered 2016-09-29: 40 ug via INTRAVENOUS
  Administered 2016-09-29: 120 ug via INTRAVENOUS

## 2016-09-29 MED ORDER — FENTANYL CITRATE (PF) 100 MCG/2ML IJ SOLN
INTRAMUSCULAR | Status: DC | PRN
  Administered 2016-09-29 (×4): 50 ug via INTRAVENOUS

## 2016-09-29 MED ORDER — ENOXAPARIN SODIUM 30 MG/0.3ML ~~LOC~~ SOLN
30.0000 mg | Freq: Two times a day (BID) | SUBCUTANEOUS | Status: DC
  Administered 2016-09-30: 30 mg via SUBCUTANEOUS
  Filled 2016-09-29: qty 0.3

## 2016-09-29 MED ORDER — SUGAMMADEX SODIUM 200 MG/2ML IV SOLN
INTRAVENOUS | Status: AC
  Filled 2016-09-29: qty 2

## 2016-09-29 MED ORDER — OXYCODONE HCL 5 MG/5ML PO SOLN: 5.0000 mg | ORAL | Status: DC | PRN

## 2016-09-29 MED ORDER — HEPARIN SODIUM (PORCINE) 5000 UNIT/ML IJ SOLN
5000.0000 [IU] | INTRAMUSCULAR | Status: AC
  Administered 2016-09-29: 5000 [IU] via SUBCUTANEOUS
  Filled 2016-09-29: qty 1

## 2016-09-29 MED ORDER — DEXAMETHASONE SODIUM PHOSPHATE 10 MG/ML IJ SOLN
INTRAMUSCULAR | Status: AC
  Filled 2016-09-29: qty 1

## 2016-09-29 MED ORDER — ACETAMINOPHEN 160 MG/5ML PO SOLN: 650.0000 mg | ORAL | Status: DC | PRN

## 2016-09-29 MED ORDER — PREMIER PROTEIN SHAKE: 2.0000 [oz_av] | ORAL | Status: DC

## 2016-09-29 MED ORDER — DEXAMETHASONE SODIUM PHOSPHATE 10 MG/ML IJ SOLN
INTRAMUSCULAR | Status: DC | PRN
  Administered 2016-09-29: 4 mg via INTRAVENOUS

## 2016-09-29 MED ORDER — SODIUM CHLORIDE 0.9 % IJ SOLN
INTRAMUSCULAR | Status: AC
  Filled 2016-09-29: qty 50

## 2016-09-29 MED ORDER — APREPITANT 40 MG PO CAPS
40.0000 mg | ORAL_CAPSULE | ORAL | Status: AC
  Administered 2016-09-29: 40 mg via ORAL
  Filled 2016-09-29: qty 1

## 2016-09-29 MED ORDER — ACETAMINOPHEN 10 MG/ML IV SOLN
INTRAVENOUS | Status: DC | PRN
  Administered 2016-09-29: 1000 mg via INTRAVENOUS

## 2016-09-29 MED ORDER — ACETAMINOPHEN 10 MG/ML IV SOLN
1000.0000 mg | Freq: Four times a day (QID) | INTRAVENOUS | Status: AC
  Administered 2016-09-29 – 2016-09-30 (×3): 1000 mg via INTRAVENOUS
  Filled 2016-09-29 (×4): qty 100

## 2016-09-29 MED ORDER — SUCCINYLCHOLINE CHLORIDE 200 MG/10ML IV SOSY
PREFILLED_SYRINGE | INTRAVENOUS | Status: DC | PRN
  Administered 2016-09-29: 160 mg via INTRAVENOUS

## 2016-09-29 MED ORDER — HYDROMORPHONE HCL 1 MG/ML IJ SOLN
0.2500 mg | INTRAMUSCULAR | Status: DC | PRN
  Administered 2016-09-29: 0.5 mg via INTRAVENOUS
  Administered 2016-09-29 (×2): 0.25 mg via INTRAVENOUS

## 2016-09-29 MED ORDER — MIDAZOLAM HCL 2 MG/2ML IJ SOLN
INTRAMUSCULAR | Status: AC
  Filled 2016-09-29: qty 2

## 2016-09-29 MED ORDER — ONDANSETRON HCL 4 MG/2ML IJ SOLN
INTRAMUSCULAR | Status: AC
  Filled 2016-09-29: qty 2

## 2016-09-29 MED ORDER — MIDAZOLAM HCL 5 MG/5ML IJ SOLN
INTRAMUSCULAR | Status: DC | PRN
  Administered 2016-09-29: 2 mg via INTRAVENOUS

## 2016-09-29 MED ORDER — BUPIVACAINE LIPOSOME 1.3 % IJ SUSP
INTRAMUSCULAR | Status: DC | PRN
  Administered 2016-09-29: 20 mL

## 2016-09-29 MED ORDER — PROPOFOL 10 MG/ML IV BOLUS
INTRAVENOUS | Status: DC | PRN
  Administered 2016-09-29: 200 mg via INTRAVENOUS

## 2016-09-29 MED ORDER — LACTATED RINGERS IV SOLN
INTRAVENOUS | Status: DC
  Administered 2016-09-29 (×2): via INTRAVENOUS

## 2016-09-29 MED ORDER — ACETAMINOPHEN 10 MG/ML IV SOLN
INTRAVENOUS | Status: AC
Start: 2016-09-29 — End: 2016-09-29
  Filled 2016-09-29: qty 100

## 2016-09-29 MED ORDER — FAMOTIDINE IN NACL 20-0.9 MG/50ML-% IV SOLN
20.0000 mg | Freq: Two times a day (BID) | INTRAVENOUS | Status: DC
  Administered 2016-09-29 – 2016-09-30 (×3): 20 mg via INTRAVENOUS
  Filled 2016-09-29: qty 50

## 2016-09-29 MED ORDER — ROCURONIUM BROMIDE 10 MG/ML (PF) SYRINGE
PREFILLED_SYRINGE | INTRAVENOUS | Status: DC | PRN
  Administered 2016-09-29: 10 mg via INTRAVENOUS
  Administered 2016-09-29: 50 mg via INTRAVENOUS

## 2016-09-29 MED ORDER — LIDOCAINE 2% (20 MG/ML) 5 ML SYRINGE
INTRAMUSCULAR | Status: DC | PRN
  Administered 2016-09-29: 100 mg via INTRAVENOUS

## 2016-09-29 MED ORDER — ROCURONIUM BROMIDE 50 MG/5ML IV SOSY
PREFILLED_SYRINGE | INTRAVENOUS | Status: AC
  Filled 2016-09-29: qty 5

## 2016-09-29 MED ORDER — SCOPOLAMINE 1 MG/3DAYS TD PT72
MEDICATED_PATCH | TRANSDERMAL | Status: AC
  Filled 2016-09-29: qty 1

## 2016-09-29 MED ORDER — PROMETHAZINE HCL 25 MG/ML IJ SOLN: 6.2500 mg | INTRAMUSCULAR | Status: DC | PRN

## 2016-09-29 MED ORDER — MEPERIDINE HCL 50 MG/ML IJ SOLN: 6.2500 mg | INTRAMUSCULAR | Status: DC | PRN

## 2016-09-29 MED ORDER — SODIUM CHLORIDE 0.9 % IJ SOLN
INTRAMUSCULAR | Status: DC | PRN
  Administered 2016-09-29: 50 mL via INTRAVENOUS

## 2016-09-29 MED ORDER — 0.9 % SODIUM CHLORIDE (POUR BTL) OPTIME
TOPICAL | Status: DC | PRN
  Administered 2016-09-29: 1000 mL

## 2016-09-29 MED ORDER — MORPHINE SULFATE (PF) 2 MG/ML IV SOLN
2.0000 mg | INTRAVENOUS | Status: DC | PRN
  Administered 2016-09-29 (×2): 2 mg via INTRAVENOUS
  Filled 2016-09-29 (×2): qty 1

## 2016-09-29 MED ORDER — KCL IN DEXTROSE-NACL 20-5-0.45 MEQ/L-%-% IV SOLN
INTRAVENOUS | Status: AC
  Administered 2016-09-29: 1000 mL via INTRAVENOUS
  Filled 2016-09-29: qty 1000

## 2016-09-29 MED ORDER — KCL IN DEXTROSE-NACL 20-5-0.45 MEQ/L-%-% IV SOLN
INTRAVENOUS | Status: DC
  Administered 2016-09-29: 1000 mL via INTRAVENOUS
  Administered 2016-09-29 – 2016-09-30 (×2): via INTRAVENOUS
  Filled 2016-09-29 (×3): qty 1000

## 2016-09-29 MED ORDER — KETOROLAC TROMETHAMINE 30 MG/ML IJ SOLN: 30.0000 mg | Freq: Once | INTRAMUSCULAR | Status: DC

## 2016-09-29 MED ORDER — SUGAMMADEX SODIUM 500 MG/5ML IV SOLN
INTRAVENOUS | Status: AC
  Filled 2016-09-29: qty 5

## 2016-09-29 MED ORDER — SUGAMMADEX SODIUM 200 MG/2ML IV SOLN
INTRAVENOUS | Status: DC | PRN
  Administered 2016-09-29: 300 mg via INTRAVENOUS

## 2016-09-29 SURGICAL SUPPLY — 70 items
ADH SKN CLS APL DERMABOND .7 (GAUZE/BANDAGES/DRESSINGS) ×1
APL SRG 32X5 SNPLK LF DISP (MISCELLANEOUS)
APPLICATOR COTTON TIP 6IN STRL (MISCELLANEOUS) IMPLANT
APPLIER CLIP ROT 10 11.4 M/L (STAPLE)
APPLIER CLIP ROT 13.4 12 LRG (CLIP)
APR CLP LRG 13.4X12 ROT 20 MLT (CLIP)
APR CLP MED LRG 11.4X10 (STAPLE)
BLADE SURG SZ11 CARB STEEL (BLADE) ×3 IMPLANT
CABLE HIGH FREQUENCY MONO STRZ (ELECTRODE) ×3 IMPLANT
CHLORAPREP W/TINT 26ML (MISCELLANEOUS) ×6 IMPLANT
CLIP APPLIE ROT 10 11.4 M/L (STAPLE) IMPLANT
CLIP APPLIE ROT 13.4 12 LRG (CLIP) IMPLANT
COVER SURGICAL LIGHT HANDLE (MISCELLANEOUS) IMPLANT
DECANTER SPIKE VIAL GLASS SM (MISCELLANEOUS) ×3 IMPLANT
DERMABOND ADVANCED (GAUZE/BANDAGES/DRESSINGS) ×1
DERMABOND ADVANCED .7 DNX12 (GAUZE/BANDAGES/DRESSINGS) ×2 IMPLANT
DEVICE SUT QUICK LOAD TK 5 (STAPLE) IMPLANT
DEVICE SUT TI-KNOT TK 5X26 (MISCELLANEOUS) IMPLANT
DEVICE SUTURE ENDOST 10MM (ENDOMECHANICALS) IMPLANT
DEVICE TROCAR PUNCTURE CLOSURE (ENDOMECHANICALS) IMPLANT
DRAPE UTILITY XL STRL (DRAPES) ×6 IMPLANT
ELECT L-HOOK LAP 45CM DISP (ELECTROSURGICAL)
ELECT PENCIL ROCKER SW 15FT (MISCELLANEOUS) IMPLANT
ELECT REM PT RETURN 9FT ADLT (ELECTROSURGICAL) ×3
ELECTRODE L-HOOK LAP 45CM DISP (ELECTROSURGICAL) IMPLANT
ELECTRODE REM PT RTRN 9FT ADLT (ELECTROSURGICAL) ×2 IMPLANT
GAUZE SPONGE 4X4 12PLY STRL (GAUZE/BANDAGES/DRESSINGS) IMPLANT
GLOVE BIO SURGEON STRL SZ7.5 (GLOVE) ×3 IMPLANT
GLOVE INDICATOR 8.0 STRL GRN (GLOVE) ×3 IMPLANT
GOWN STRL REUS W/TWL XL LVL3 (GOWN DISPOSABLE) ×12 IMPLANT
HOVERMATT SINGLE USE (MISCELLANEOUS) ×3 IMPLANT
IRRIG SUCT STRYKERFLOW 2 WTIP (MISCELLANEOUS) ×3
IRRIGATION SUCT STRKRFLW 2 WTP (MISCELLANEOUS) ×2 IMPLANT
KIT BASIN OR (CUSTOM PROCEDURE TRAY) ×3 IMPLANT
MARKER SKIN DUAL TIP RULER LAB (MISCELLANEOUS) ×3 IMPLANT
NEEDLE SPNL 22GX3.5 QUINCKE BK (NEEDLE) ×3 IMPLANT
PACK UNIVERSAL I (CUSTOM PROCEDURE TRAY) ×3 IMPLANT
RELOAD STAPLER BLUE 60MM (STAPLE) ×2 IMPLANT
RELOAD STAPLER GOLD 60MM (STAPLE) ×2 IMPLANT
RELOAD STAPLER GREEN 60MM (STAPLE) ×4 IMPLANT
SCISSORS LAP 5X45 EPIX DISP (ENDOMECHANICALS) ×3 IMPLANT
SEALANT SURGICAL APPL DUAL CAN (MISCELLANEOUS) IMPLANT
SHEARS HARMONIC ACE PLUS 45CM (MISCELLANEOUS) ×3 IMPLANT
SLEEVE ADV FIXATION 5X100MM (TROCAR) ×6 IMPLANT
SLEEVE GASTRECTOMY 40FR VISIGI (MISCELLANEOUS) ×3 IMPLANT
SOLUTION ANTI FOG 6CC (MISCELLANEOUS) ×3 IMPLANT
SPONGE LAP 18X18 X RAY DECT (DISPOSABLE) ×3 IMPLANT
STAPLER ECHELON BIOABSB 60 FLE (MISCELLANEOUS) ×15 IMPLANT
STAPLER ECHELON LONG 60 440 (INSTRUMENTS) IMPLANT
STAPLER RELOAD BLUE 60MM (STAPLE) ×3
STAPLER RELOAD GOLD 60MM (STAPLE) ×3
STAPLER RELOAD GREEN 60MM (STAPLE) ×6
SUT MNCRL AB 4-0 PS2 18 (SUTURE) ×3 IMPLANT
SUT SURGIDAC NAB ES-9 0 48 120 (SUTURE) IMPLANT
SUT VIC AB 0 BRD 54 (SUTURE) ×3 IMPLANT
SUT VICRYL 0 TIES 12 18 (SUTURE) IMPLANT
SYR 10ML ECCENTRIC (SYRINGE) ×3 IMPLANT
SYR 20CC LL (SYRINGE) ×3 IMPLANT
SYR 50ML LL SCALE MARK (SYRINGE) ×3 IMPLANT
SYSTEM WECK SHIELD CLOSURE (TROCAR) IMPLANT
TOWEL OR 17X26 10 PK STRL BLUE (TOWEL DISPOSABLE) ×3 IMPLANT
TOWEL OR NON WOVEN STRL DISP B (DISPOSABLE) ×3 IMPLANT
TRAY FOLEY W/METER SILVER 16FR (SET/KITS/TRAYS/PACK) IMPLANT
TROCAR ADV FIXATION 5X100MM (TROCAR) ×3 IMPLANT
TROCAR BLADELESS 15MM (ENDOMECHANICALS) ×3 IMPLANT
TROCAR BLADELESS OPT 5 100 (ENDOMECHANICALS) ×3 IMPLANT
TUBE CALIBRATION LAPBAND (TUBING) ×3 IMPLANT
TUBING CONNECTING 10 (TUBING) ×3 IMPLANT
TUBING ENDO SMARTCAP (MISCELLANEOUS) ×3 IMPLANT
TUBING INSUF HEATED (TUBING) ×3 IMPLANT

## 2016-09-29 NOTE — Op Note (Signed)
Preoperative diagnosis: laparoscopic sleeve gastrectomy  Postoperative diagnosis: Same   Procedure: Upper endoscopy   Surgeon: Chelsea A Connor, M.D.  Anesthesia: Gen.   Indications for procedure: This patient was undergoing a laparoscopic sleeve gastrectomy.   Description of procedure: The endoscopy was placed in the mouth and into the oropharynx and under endoscopic vision it was advanced to the esophagogastric junction. The pouch was insufflated and no bleeding or bubbles were seen. The GEJ was identified at 42cm from the teeth.  No bleeding or leaks were detected. The scope was withdrawn without difficulty.   Chelsea A Connor, M.D. General, Bariatric, & Minimally Invasive Surgery [facility]    

## 2016-09-29 NOTE — Op Note (Signed)
09/29/2016 [name] [name] [birth year] [id number]   PRE-OPERATIVE DIAGNOSIS:     Obesity, morbid BMI 50   Essential hypertension   Obstructive sleep apnea   Prediabetes  POST-OPERATIVE DIAGNOSIS:  same  PROCEDURE:  Procedure(s): LAPAROSCOPIC SLEEVE GASTRECTOMY  UPPER GI ENDOSCOPY  SURGEON:  Surgeon(s): [name], MD FACS FASMBS  ASSISTANTS: Romana Juniper MD  ANESTHESIA:   general  DRAINS: none   BOUGIE: 40 fr ViSiGi  LOCAL MEDICATIONS USED:  MARCAINE + Exparel  SPECIMEN:  Source of Specimen:  Greater curvature of stomach  DISPOSITION OF SPECIMEN:  PATHOLOGY  COUNTS:  YES  INDICATION FOR PROCEDURE: This is a very pleasant 55 y.o.-year-old morbidly obese female who has had unsuccessful attempts for sustained weight loss. The patient presents today for a planned laparoscopic sleeve gastrectomy with upper endoscopy. We have discussed the risk and benefits of the procedure extensively preoperatively. Please see my separate notes.  PROCEDURE: After obtaining informed consent and receiving 5000 units of subcutaneous heparin, the patient was brought to the operating room at [facility] and placed supine on the operating room table. General endotracheal anesthesia was established. Sequential compression devices were placed. A orogastric tube was placed. The patient's abdomen was prepped and draped in the usual standard surgical fashion. The patient received preoperative IV antibiotic. A surgical timeout was performed.  Access to the abdomen was achieved using a 5 mm 0 laparoscope thru a 5 mm trocar In the left upper Quadrant 2 fingerbreadths below the left subcostal margin using the Optiview technique. Pneumoperitoneum was smoothly established up to 15 mm of mercury. The laparoscope was advanced and the abdominal cavity was surveilled. The patient was then placed in reverse Trendelenburg. There was no evidence of a hiatal hernia on laparoscopy - gap in the left and  right crus anteriorly.  A 5 mm trocar was placed slightly above and to the left of the umbilicus under direct visualization.  The [facility] liver retractor was placed under the left lobe of the liver through a 5 mm trocar incision site in the subxiphoid position. A 5 mm trocar was placed in the lateral right upper quadrant along with a 15 mm trocar in the mid right abdomen. A final 5 mm trocar was placed in the lateral LUQ.  All under direct visualization after local had been infiltrated.  The stomach was inspected. It was completely decompressed and the orogastric tube was removed.  Her preop UGI showed a small sliding hiatal hernia. However there was no anterior dimple that was obviously visible. The calibration tube was placed in the oropharynx and guided down into the stomach by the CRNA. 10 mL of air was insufflated into the calibration balloon. The calibration tubing was then gently pulled back by the CRNA and it did not slide past the GE junction. At this point the calibration tubing was desufflated and removed. This confirmed my suspicion of no clinically significant hiatal hernia.   We identified the pylorus and measured 6 cm proximal to the pylorus and identified an area of where we would start taking down the short gastric vessels. Harmonic scalpel was used to take down the short gastric vessels along the greater curvature of the stomach. We were able to enter the lesser sac. We continued to march along the greater curvature of the stomach taking down the short gastrics. As we approached the gastrosplenic ligament we took care in this area not to injure the spleen. We were able to take down the entire gastrosplenic ligament. We then  mobilized the fundus away from the left crus of diaphragm. There were some posterior gastric avascular attachments which were taken down. This left the stomach completely mobilized. No vessels had been taken down along the lesser curvature of the stomach.  We then  reidentified the pylorus. A 40Fr ViSiGi was then placed in the oropharynx and advanced down into the stomach and placed in the distal antrum and positioned along the lesser curvature. It was placed under suction which secured the 40Fr ViSiGi in place along the lesser curve. Then using the [employer] echelon 60 mm stapler with a green load with Seamguard, I placed a stapler along the antrum approximately 5 cm from the pylorus. The stapler was angled so that there is ample room at the angularis incisura. I then fired the first staple load after inspecting it posteriorly to ensure adequate space both anteriorly and posteriorly. At this point I still was not completely past the angularis so with another green load with Seamguard, I placed the stapler in position just inside the prior stapleline. We then rotated the stomach to insure that there was adequate anteriorly as well as posteriorly. The stapler was then fired. I used another 10mm green cartridge with seamguard. At this point I started using 60 mm gold load staple cartridge x 1 with Seamguard. The echelon stapler was then repositioned with a 60 mm gold load with Seamguard and we continued to march up along the [place]. My assistant was holding traction along the greater curvature stomach along the cauterized short gastric vessels ensuring that the stomach was symmetrically retracted. Prior to each firing of the staple, we rotated the stomach to ensure that there is adequate stomach left. I did mobilize some of the esophageal fat pad off of the fundus anteriorly using gentle blunt dissection.  As we approached the fundus, I used 60 mm blue cartridge with Seamguard aiming slightly lateral to the esophageal fat pad. Although the staples on this fire had completely gone thru the last part of the stomach it had not completely cut it. Therefore 1 additional 60 blue load was used to free the remaining stomach. The sleeve was inspected. There is no evidence of cork screw.  The staple line appeared hemostatic. The CRNA inflated the ViSiGi to the green zone and the upper abdomen was flooded with saline. There were no bubbles. The sleeve was decompressed and the ViSiGi removed. My assistant scrubbed out and performed an upper endoscopy. The sleeve easily distended with air and the scope was easily advanced to the pylorus. There is no evidence of internal bleeding or cork screwing. There was no narrowing at the angularis. There is no evidence of bubbles. Please see his operative note for further details. The gastric sleeve was decompressed and the endoscope was removed.  The greater curvature the stomach was grasped with a laparoscopic grasper and removed from the 15 mm trocar site.  The liver retractor was removed. I then closed the 15 mm trocar site with 1 interrupted 0 Vicryl sutures through the fascia using the endoclose. The closure was viewed laparoscopically and it was airtight. 70 cc of Exparel was then infiltrated in the preperitoneal spaces around the trocar sites. Pneumoperitoneum was released. All trocar sites were closed with a 4-0 Monocryl in a subcuticular fashion followed by the application of Dermabond. The patient was extubated and taken to the recovery room in stable condition. All needle, instrument, and sponge counts were correct x2. There are no immediate complications  (3) 60 mm green with  Seamguard (1) 60 mm gold with seamguard (2) 60 mm blue with 1 seamguard  PLAN OF CARE: Admit to inpatient   PATIENT DISPOSITION:  PACU - hemodynamically stable.   Delay start of Pharmacological VTE agent (>24hrs) due to surgical blood loss or risk of bleeding:  no  Leighton Ruff. Redmond Pulling, MD, FACS FASMBS General, Bariatric, & Minimally Invasive Surgery [facility], [place]

## 2016-09-29 NOTE — Anesthesia Preprocedure Evaluation (Signed)
Anesthesia Evaluation  Patient identified by MRN, date of birth, ID band Patient awake    Reviewed: Allergy & Precautions, NPO status , Patient's Chart, lab work & pertinent test results  Airway Mallampati: II       Dental no notable dental hx.    Pulmonary neg pulmonary ROS,    Pulmonary exam normal        Cardiovascular hypertension, Pt. on medications Normal cardiovascular exam     Neuro/Psych negative neurological ROS  negative psych ROS   GI/Hepatic negative GI ROS, Neg liver ROS,   Endo/Other  Morbid obesity  Renal/GU negative Renal ROS  negative genitourinary   Musculoskeletal negative musculoskeletal ROS (+)   Abdominal (+) + obese,   Peds negative pediatric ROS (+)  Hematology negative hematology ROS (+)   Anesthesia Other Findings [name]  ECHO COMPLETE W IMAGE ENHANCING AGENT  Order# [id number]  Reading physician: Josue Hector, MD Ordering physician: Minus Breeding, MD Study date: 08/20/16 Result Notes   Notes Recorded by Vennie Homans on 08/25/2016 at 5:05 PM EDT Pt is aware of her Echo result ------  Notes Recorded by Vennie Homans on 08/25/2016 at 2:33 PM EDT Leave message for pt to call back  Result send to pt PCP via Epic ------  Notes Recorded by Minus Breeding, MD on 08/22/2016 at 11:32 AM EDT Trivial MR. No change in therapy. Call Ms. Barnes-Howard with the results and send results to WEBB, Valla Leaver, MD   Study Result   Result status: Final result                          [facility]                        [address], [place] [address]                            [phone]  ------------------------------------------------------------------- Transthoracic Echocardiography  Patient:    [name] [name], [name] [name] MR #:       [id number] Study Date: 08/20/2016 Gender:     F Age:        55 Height:     160  cm Weight:     133.4 kg BSA:        2.52 m^2 Pt. Status: Room:   ATTENDING    Minus Breeding, MD  ORDERING     Minus Breeding, MD  [place], MD  SONOGRAPHER  Wyatt Mage, RDCS  PERFORMING   [facility], Outpatient  cc:  ------------------------------------------------------------------- LV EF: 55%  ------------------------------------------------------------------- Indications:      Mitral regurgitation (I34).  ------------------------------------------------------------------- History:   Risk factors:  LBBB. Former tobacco use. Hypertension. Obese.  ------------------------------------------------------------------- Study Conclusions  - Procedure narrative: Transthoracic echocardiography. Image   quality was suboptimal. The study was technically difficult.   Intravenous contrast (Definity) was administered. - Left ventricle: Abnormal septal motion The cavity size was   normal. Systolic function was normal. The estimated ejection   fraction was 55%. Wall motion was normal; there were no regional   wall motion abnormalities. Left ventricular diastolic function   parameters were normal. - Atrial septum: No defect or patent foramen ovale was identified.  ------------------------------------------------------------------- Labs,  prior tests, procedures, and surgery: Transthoracic echocardiography (03/15/2012).     EF was 65%.  ------------------------------------------------------------------- Study data:  Comparison was made to the study of 03/15/2012.  Study status:  Routine.  Procedure:  The patient reported no pain pre or post test. Transthoracic echocardiography. Image quality was suboptimal. The study was technically difficult, as a result of poor acoustic windows, poor sound wave transmission, and body habitus. Intravenous contrast (Definity) was administered.  Study completion:  There were no complications.           Transthoracic echocardiography.  M-mode, complete 2D, spectral Doppler, and color Doppler.  Birthdate:  Patient birthdate: 28-Apr-1961.  Age:  Patient is 55 yr old.  Sex:  Gender: female.    BMI: 52.1 kg/m^2.  Blood pressure:     100/80  Patient status:  Outpatient.  Study date: Study date: 08/20/2016. Study time: 08:29 AM.  Location:  [facility]  -------------------------------------------------------------------  ------------------------------------------------------------------- Left ventricle:  Abnormal septal motion The cavity size was normal. Systolic function was normal. The estimated ejection fraction was 55%. Wall motion was normal; there were no regional wall motion abnormalities. The transmitral flow pattern was normal. The deceleration time of the early transmitral flow velocity was normal. The pulmonary vein flow pattern was normal. The tissue Doppler parameters were normal. Left ventricular diastolic function parameters were normal.  ------------------------------------------------------------------- Aortic valve:   Trileaflet; normal thickness leaflets. Mobility was not restricted.  Doppler:  Transvalvular velocity was within the normal range. There was no stenosis. There was no regurgitation.   ------------------------------------------------------------------- Aorta:  Aortic root: The aortic root was normal in size.  ------------------------------------------------------------------- Mitral valve:   Mildly thickened leaflets . Mobility was not restricted.  Doppler:  Transvalvular velocity was within the normal range. There was no evidence for stenosis. There was trivial regurgitation.    Peak gradient (D): 5 mm Hg.  ------------------------------------------------------------------- Left atrium:  The atrium was normal in size.  ------------------------------------------------------------------- Atrial septum:  No defect or patent foramen ovale was  identified.   ------------------------------------------------------------------- Right ventricle:  The cavity size was normal. Wall thickness was normal. Systolic function was normal.  ------------------------------------------------------------------- Pulmonic valve:    Doppler:  Transvalvular velocity was within the normal range. There was no evidence for stenosis. There was trivial regurgitation.  ------------------------------------------------------------------- Tricuspid valve:   Structurally normal valve.    Doppler: Transvalvular velocity was within the normal range. There was trivial regurgitation.  ------------------------------------------------------------------- Pulmonary artery:   The main pulmonary artery was normal-sized. Systolic pressure was within the normal range.  ------------------------------------------------------------------- Right atrium:  The atrium was normal in size.  ------------------------------------------------------------------- Pericardium:  The pericardium was normal in appearance. There was no pericardial effusion.  ------------------------------------------------------------------- Systemic veins: Inferior vena cava: The vessel was normal in size. The respirophasic diameter changes were in the normal range (>= 50%), consistent with normal central venous pressure.  ------------------------------------------------------------------- Measurements   Left ventricle                         Value        Reference  LV ID, ED, PLAX chordal        (L)     36.6  mm     43 - 52  LV ID, ES, PLAX chordal                27.9  mm     23 - 38  LV fx shortening, PLAX chordal (L)  24    %      >=29  LV PW thickness, ED                    10    mm     ---------  IVS/LV PW ratio, ED                    0.99         <=1.3  LV e&', lateral                         9.25  cm/s   ---------  LV E/e&', lateral                       12.54         ---------  LV e&', medial                          7.4   cm/s   ---------  LV E/e&', medial                        15.68        ---------  LV e&', average                         8.33  cm/s   ---------  LV E/e&', average                       13.93        ---------    Ventricular septum                     Value        Reference  IVS thickness, ED                      9.91  mm     ---------    LVOT                                   Value        Reference  LVOT peak velocity, S                  110   cm/s   ---------  LVOT mean velocity, S                  76.8  cm/s   ---------  LVOT VTI, S                            22.9  cm     ---------    Aorta                                  Value        Reference  Aortic root ID, ED                     28    mm     ---------    Left atrium  Value        Reference  LA ID, A-P, ES                         36    mm     ---------  LA ID/bsa, A-P                         1.43  cm/m^2 <=2.2  LA volume, S                           47.6  ml     ---------  LA volume/bsa, S                       18.9  ml/m^2 ---------  LA volume, ES, 1-p A4C                 55.4  ml     ---------  LA volume/bsa, ES, 1-p A4C             22    ml/m^2 ---------  LA volume, ES, 1-p A2C                 40.6  ml     ---------  LA volume/bsa, ES, 1-p A2C             16.1  ml/m^2 ---------    Mitral valve                           Value        Reference  Mitral E-wave peak velocity            116   cm/s   ---------  Mitral A-wave peak velocity            79.6  cm/s   ---------  Mitral deceleration time       (L)     137   ms     150 - 230  Mitral peak gradient, D                5     mm Hg  ---------  Mitral E/A ratio, peak                 1.5          ---------    Systemic veins                         Value        Reference  Estimated CVP                          3     mm Hg  ---------    Right ventricle                        Value         Reference  RV s&', lateral, S                      13.1  cm/s   ---------  Legend: (L)  and  (H)  mark values outside specified reference range.  ------------------------------------------------------------------- Prepared and Electronically Authenticated by  Jenkins Rouge, M.D. 2017-09-27T10:48:27 PACS Images  Show images for ECHOCARDIOGRAM COMPLETE Patient Information   Patient Name Nickol, Roseman Sex Female DOB 1961/04/19 SSN [id number] Reason For Exam  Priority: Routine  Dx: MR (mitral regurgitation) (I34.0 (ICD-10-CM)) Surgical History   Surgical History    No past medical history on file.  Other Surgical History    Procedure Laterality Date Comment Source BREAST SURGERY   breast reduction  Provider removed bone from left knee     Provider  Patient Data   BP  117/95 mmHg    Performing Technologist/Nurse   Performing Technologist/Nurse: Etter Sjogren, RDCS EF and Strain Measurements    Ejection Fraction TDI e' lateral  9.25   TDI e' medial  7.4      2D Measurements    LV PW d  10 mm (A) (Range: 0.6 - 1.1)  FS  24 % (A) (Range: 28 - 44)  LA ID, A-P, ES  36 mm  LA diam end sys  36 mm  LA vol  47.6 mL  LA vol index  20.9 mL/m2  IVS/LV PW RATIO, ED  .99   FS  24 % (A) (Range: 28 - 44)    Right Ventricle Measurements    Lateral S' vel  13.1 cm/sec    Aortic Valve Measurements    Stenosis LVOT VTI  22.9 cm  LVOT peak vel  110 cm/s        Left Atrium Measurements    LA ID, A-P, ES  36 mm  LA vol  47.6 mL  LA vol index  20.9 mL/m2  LA vol A4C  55.4 ml    Mitral Valve Measurements    Stenosis Peak grad  5 mmHg  MV pk A vel  79.6 m/s    Regurgitation MV Dec  137   E decel time  137 msec    PISA-MS/PISA-MR MV pk E vel  116 m/s       Implants     No active implants to display in this view. Order-Level Documents:   There are no order-level documents.   Encounter-Level Documents - 08/20/2016:   Electronic signature on 08/20/2016 8:20 AM  Electronic signature on 08/20/2016 8:19 AM    Signed   Electronically signed by Josue Hector, MD on 08/20/16 at 1048 EDT Printable Result Report   Result Report    Reproductive/Obstetrics negative OB ROS                             Anesthesia Physical Anesthesia Plan  ASA: III  Anesthesia Plan: General   Post-op Pain Management:    Induction: Intravenous  Airway Management Planned: Oral ETT  Additional Equipment:   Intra-op Plan:   Post-operative Plan: Extubation in OR  Informed Consent: I have reviewed the patients History and Physical, chart, labs and discussed the procedure including the risks, benefits and alternatives for the proposed anesthesia with the patient or authorized representative who has indicated his/her understanding and acceptance.   Dental advisory given  Plan Discussed with: CRNA and Surgeon  Anesthesia Plan Comments:         Anesthesia Quick Evaluation

## 2016-09-29 NOTE — Anesthesia Postprocedure Evaluation (Signed)
Anesthesia Post Note  Patient: [name] [name]  Procedure(s) Performed: Procedure(s) (LRB): LAPAROSCOPIC GASTRIC SLEEVE RESECTION, UPPER ENDO (N/A) UPPER GI ENDOSCOPY  Patient location during evaluation: PACU Anesthesia Type: General Level of consciousness: awake and sedated Pain management: pain level not controlled Vital Signs Assessment: vitals unstable Respiratory status: spontaneous breathing Cardiovascular status: stable Postop Assessment: no signs of nausea or vomiting Anesthetic complications: no     Last Vitals:  Vitals:   09/29/16 1300 09/29/16 1319  BP: 123/70 130/64  Pulse: 95 100  Resp: 19 18  Temp: 36.7 C 36.6 C    Last Pain:  Vitals:   09/29/16 1300  TempSrc:   PainSc: 9    Pain Goal: Patients Stated Pain Goal: 4 (09/29/16 0826)               Dodge Ator JR,JOHN Mateo Flow

## 2016-09-29 NOTE — Transfer of Care (Signed)
Immediate Anesthesia Transfer of Care Note  Patient: [name] [name]  Procedure(s) Performed: Procedure(s): LAPAROSCOPIC GASTRIC SLEEVE RESECTION, UPPER ENDO (N/A) UPPER GI ENDOSCOPY  Patient Location: PACU  Anesthesia Type:General  Level of Consciousness:  sedated, patient cooperative and responds to stimulation  Airway & Oxygen Therapy:Patient Spontanous Breathing and Patient connected to face mask oxgen  Post-op Assessment:  Report given to PACU RN and Post -op Vital signs reviewed and stable  Post vital signs:  Reviewed and stable  Last Vitals:  Vitals:   09/29/16 0826  BP: 136/75  Pulse: 95  Resp: 18  Temp: 123XX123 C    Complications: No apparent anesthesia complications

## 2016-09-29 NOTE — Anesthesia Procedure Notes (Signed)
Procedure Name: Intubation Date/Time: 09/29/2016 10:05 AM Performed by: Montel Clock Pre-anesthesia Checklist: Patient identified, Emergency Drugs available, Suction available, Patient being monitored and Timeout performed Patient Re-evaluated:Patient Re-evaluated prior to inductionOxygen Delivery Method: Circle system utilized Preoxygenation: Pre-oxygenation with 100% oxygen Intubation Type: IV induction Ventilation: Mask ventilation without difficulty and Oral airway inserted - appropriate to patient size Laryngoscope Size: Mac and 3 Grade View: Grade II Tube type: Oral Tube size: 7.5 mm Number of attempts: 1 Airway Equipment and Method: Stylet Placement Confirmation: ETT inserted through vocal cords under direct vision,  positive ETCO2 and breath sounds checked- equal and bilateral Secured at: 21 cm Tube secured with: Tape Dental Injury: Teeth and Oropharynx as per pre-operative assessment

## 2016-09-29 NOTE — Discharge Instructions (Addendum)
Aprepitant Discharge Instructions  °On the day of surgery you were given the medication aprepitant. This medication interacts with hormonal forms of birth control (oral contraceptives and injected or implanted birth control) and may make them ineffective. °IF YOU USE ANY HORMONAL FORM OF BIRTH CONTROL, YOU MUST USE AN ADDITIONAL BARRIER BIRTH CONTROL METHOD FOR ONE MONTH after receiving aprepitant or there is a chance you could become pregnant. ° ° ° ° °GASTRIC BYPASS/SLEEVE ° Home Care Instructions ° ° These instructions are to help you care for yourself when you go home. ° °Call: If you have any problems. °• Call [phone] and ask for the surgeon on call °• If you need immediate assistance come to the ER at [facility]. Tell the ER staff you are a new post-op gastric bypass or gastric sleeve patient  °Signs and symptoms to report: • Severe  vomiting or nausea °o If you cannot handle clear liquids for longer than 1 day, call your surgeon °• Abdominal pain which does not get better after taking your pain medication °• Fever greater than 100.4°  F and chills °• Heart rate over 100 beats a minute °• Trouble breathing °• Chest pain °• Redness,  swelling, drainage, or foul odor at incision (surgical) sites °• If your incisions open or pull apart °• Swelling or pain in calf (lower leg) °• Diarrhea (Loose bowel movements that happen often), frequent watery, uncontrolled bowel movements °• Constipation, (no bowel movements for 3 days) if this happens: °o Take Milk of Magnesia, 2 tablespoons by mouth, 3 times a day for 2 days if needed °o Stop taking Milk of Magnesia once you have had a bowel movement °o Call your doctor if constipation continues °Or °o Take Miralax  (instead of Milk of Magnesia) following the label instructions °o Stop taking Miralax once you have had a bowel movement °o Call your doctor if constipation continues °• Anything you think is “abnormal for you” °  °Normal side effects after surgery: • Unable  to sleep at night or unable to concentrate °• Irritability °• Being tearful (crying) or depressed ° °These are common complaints, possibly related to your anesthesia, stress of surgery, and change in lifestyle, that usually go away a few weeks after surgery. If these feelings continue, call your medical doctor.  °Wound Care: You may have surgical glue, steri-strips, or staples over your incisions after surgery °• Surgical glue: Looks like clear film over your incisions and will wear off a little at a time °• Steri-strips: Adhesive strips of tape over your incisions. You may notice a yellowish color on skin under the steri-strips. This is used to make the steri-strips stick better. Do not pull the steri-strips off - let them fall off °• Staples: Staples may be removed before you leave the hospital °o If you go home with staples, call [facility] for an appointment with your surgeon’s nurse to have staples removed 10 days after surgery, [phone] °• Showering: You may shower two (2) days after your surgery unless your surgeon tells you differently °o Wash gently around incisions with warm soapy water, rinse well, and gently pat dry °o If you have a drain (tube from your incision), you may need someone to hold this while you shower °o No tub baths until staples are removed and incisions are healed °  °Medications: • Medications should be liquid or crushed if larger than the size of a dime °• Extended release pills (medication that releases a little bit at   a time through the  day) should not be crushed °• Depending on the size and number of medications you take, you may need to space (take a few throughout the day)/change the time you take your medications so that you do not over-fill your pouch (smaller stomach) °• Make sure you follow-up with you primary care physician to make medication changes needed during rapid weight loss and life -style changes °• If you have diabetes, follow up with your  doctor that orders your diabetes medication(s) within one week after surgery and check your blood sugar regularly ° °• Do not drive while taking narcotics (pain medications) ° °• Do not take acetaminophen (Tylenol) and Roxicet or Lortab Elixir at the same time since these pain medications contain acetaminophen °  °Diet:  °First 2 Weeks You will see the nutritionist about two (2) weeks after your surgery. The nutritionist will increase the types of foods you can eat if you are handling liquids well: °• If you have severe vomiting or nausea and cannot handle clear liquids lasting longer than 1 day call your surgeon °Protein Shake °• Drink at least 2 ounces of shake 5-6 times per day °• Each serving of protein shakes (usually 8-12 ounces) should have a minimum of: °o 15 grams of protein °o And no more than 5 grams of carbohydrate °• Goal for protein each day: °o Men = 80 grams per day °o Women = 60 grams per day °  ° • Protein powder may be added to fluids such as non-fat milk or Lactaid milk or Soy milk (limit to 35 grams added protein powder per serving) ° °Hydration °• Slowly increase the amount of water and other clear liquids as tolerated (See Acceptable Fluids) °• Slowly increase the amount of protein shake as tolerated °• Sip fluids slowly and throughout the day °• May use sugar substitutes in small amounts (no more than 6-8 packets per day; i.e. Splenda) ° °Fluid Goal °• The first goal is to drink at least 8 ounces of protein shake/drink per day (or as directed by the nutritionist); some examples of protein shakes are Syntrax Nectar, Adkins Advantage, EAS Edge HP, and Unjury. - See handout from pre-op Bariatric Education Class: °o Slowly increase the amount of protein shake you drink as tolerated °o You may find it easier to slowly sip shakes throughout the day °o It is important to get your proteins in first °• Your fluid goal is to drink 64-100 ounces of fluid daily °o It may take a few weeks to build up to  this  °• 32 oz. (or more) should be clear liquids °And °• 32 oz. (or more) should be full liquids (see below for examples) °• Liquids should not contain sugar, caffeine, or carbonation ° °Clear Liquids: °• Water of Sugar-free flavored water (i.e. Fruit H²O, Propel) °• Decaffeinated coffee or tea (sugar-free) °• Crystal lite, Wyler’s Lite, Minute Maid Lite °• Sugar-free Jell-O °• Bouillon or broth °• Sugar-free Popsicle:    - Less than 20 calories each; Limit 1 per day ° °Full Liquids: °                  Protein Shakes/Drinks + 2 choices per day of other full liquids °• Full liquids must be: °o No More Than 12 grams of Carbs per serving °o No More Than 3 grams of Fat per serving °• Strained low-fat cream soup °• Non-Fat milk °• Fat-free Lactaid Milk °• Sugar-free yogurt (Dannon Lite & Fit, Greek   yogurt) ° °  °Vitamins and Minerals • Start 1 day after surgery unless otherwise directed by your surgeon °• 2 Chewable Multivitamin / Multimineral Supplement with iron (i.e. Centrum for Adults) °• Vitamin B-12, 350-500 micrograms sub-lingual (place tablet under the tongue) each day °• Chewable Calcium Citrate with Vitamin D-3 °(Example: 3 Chewable Calcium  Plus 600 with Vitamin D-3) °o Take 500 mg three (3) times a day for a total of 1500 mg each day °o Do not take all 3 doses of calcium at one time as it may cause constipation, and you can only absorb 500 mg at a time °o Do not mix multivitamins containing iron with calcium supplements;  take 2 hours apart °o Do not substitute Tums (calcium carbonate) for your calcium °• Menstruating women and those at risk for anemia ( a blood disease that causes weakness) may need extra iron °o Talk to your doctor to see if you need more iron °• If you need extra iron: Total daily Iron recommendation (including Vitamins) is 50 to 100 mg Iron/day °• Do not stop taking or change any vitamins or minerals until you talk to your nutritionist or surgeon °• Your nutritionist and/or surgeon must  approve all vitamin and mineral supplements °  °Activity and Exercise: It is important to continue walking at home. Limit your physical activity as instructed by your doctor. During this time, use these guidelines: °• Do not lift anything greater than ten  (10) pounds for at least two (2) weeks °• Do not go back to work or drive until your surgeon says you can °• You may have sex when you feel comfortable °o It is VERY important for female patients to use a reliable birth control method; fertility often increase after surgery °o Do not get pregnant for at least 18 months °• Start exercising as soon as your doctor tells you that you can °o Make sure your doctor approves any physical activity °• Start with a simple walking program °• Walk 5-15 minutes each day, 7 days per week °• Slowly increase until you are walking 30-45 minutes per day °• Consider joining our BELT program. [phone] or email [email or web address] °  °Special Instructions Things to remember: °• Free counseling is available for you and your family through collaboration between [employer] and [employer]. Please call [phone] and leave a message °• Use your CPAP when sleeping if this applies to you °• Consider buying a medical alert bracelet that says you had lap-band surgery °  °  You will likely have your first fill (fluid added to your band) 6 - 8 weeks after surgery °• [facility] has a free Bariatric Surgery Support Group that meets monthly, the 3rd Thursday, 6pm. [employer] [facility]. You can see classes online at [email or web address] °• It is very important to keep all follow up appointments with your surgeon, nutritionist, primary care physician, and behavioral health practitioner °o After the first year, please follow up with your bariatric surgeon and nutritionist at least once a year in order to maintain best weight loss results °      °             [facility]:  [phone] ° °             [facility]: [phone] ° °             Bariatric Nurse Coordinator: [phone]  °Gastric Bypass/Sleeve Home Care   Instructions  Rev. 12/2012    ° °                                                    Reviewed and Endorsed °                                                   by [facility] Patient Education Committee, Jan, 2014 ° ° ° ° ° ° ° ° ° °

## 2016-09-29 NOTE — Interval H&P Note (Signed)
History and Physical Interval Note:  09/29/2016 9:16 AM  [name] [name]  has presented today for surgery, with the diagnosis of Morbid Obesity  The various methods of treatment have been discussed with the patient and family. After consideration of risks, benefits and other options for treatment, the patient has consented to  Procedure(s): LAPAROSCOPIC GASTRIC SLEEVE RESECTION, UPPER ENDO (N/A) as a surgical intervention .  The patient's history has been reviewed, patient examined, no change in status, stable for surgery.  I have reviewed the patient's chart and labs.  Questions were answered to the patient's satisfaction.    Possible hiatal hernia repair  Leighton Ruff. Redmond Pulling, MD, [place], Bariatric, & Minimally Invasive Surgery [facility], [place]   [facility]

## 2016-09-29 NOTE — Progress Notes (Signed)
Pt placed on nocturnal cpap, however pt unable to tolerate more than five minutes.  Nasal cpap set on autotitration mode 5-20cm h2o with 2l o2 bleedin.  Pt states she is willing to try it again tomorrow night.  Pt placed back on 2lnc.  RN aware.

## 2016-09-30 LAB — COMPREHENSIVE METABOLIC PANEL
ALK PHOS: 74 U/L (ref 38–126)
ALT: 28 U/L (ref 14–54)
AST: 31 U/L (ref 15–41)
Albumin: 3.7 g/dL (ref 3.5–5.0)
Anion gap: 9 (ref 5–15)
BILIRUBIN TOTAL: 0.3 mg/dL (ref 0.3–1.2)
BUN: 13 mg/dL (ref 6–20)
CALCIUM: 8.9 mg/dL (ref 8.9–10.3)
CO2: 26 mmol/L (ref 22–32)
Chloride: 103 mmol/L (ref 101–111)
Creatinine, Ser: 0.69 mg/dL (ref 0.44–1.00)
GFR calc Af Amer: >60 mL/min (ref >60)
GLUCOSE: 149 mg/dL — AB (ref 65–99)
POTASSIUM: 3.8 mmol/L (ref 3.5–5.1)
Sodium: 138 mmol/L (ref 135–145)
TOTAL PROTEIN: 7.5 g/dL (ref 6.5–8.1)

## 2016-09-30 LAB — CBC WITH DIFFERENTIAL/PLATELET
BASOS ABS: 0 10*3/uL (ref 0.0–0.1)
Basophils Relative: 0 %
Eosinophils Absolute: 0 10*3/uL (ref 0.0–0.7)
Eosinophils Relative: 0 %
HEMATOCRIT: 35.5 % — AB (ref 36.0–46.0)
HEMOGLOBIN: 11.5 g/dL — AB (ref 12.0–15.0)
LYMPHS PCT: 12 %
Lymphs Abs: 1.2 10*3/uL (ref 0.7–4.0)
MCH: 27.6 pg (ref 26.0–34.0)
MCHC: 32.4 g/dL (ref 30.0–36.0)
MCV: 85.3 fL (ref 78.0–100.0)
MONO ABS: 0.6 10*3/uL (ref 0.1–1.0)
MONOS PCT: 6 %
NEUTROS ABS: 8.6 10*3/uL — AB (ref 1.7–7.7)
NEUTROS PCT: 82 %
Platelets: 270 10*3/uL (ref 150–400)
RBC: 4.16 MIL/uL (ref 3.87–5.11)
RDW: 14.6 % (ref 11.5–15.5)
WBC: 10.4 10*3/uL (ref 4.0–10.5)

## 2016-09-30 MED ORDER — ALUM & MAG HYDROXIDE-SIMETH 200-200-20 MG/5ML PO SUSP: 15.0000 mL | Freq: Four times a day (QID) | ORAL | Status: DC | PRN

## 2016-09-30 MED ORDER — OXYCODONE HCL 5 MG/5ML PO SOLN: 5.0000 mg | ORAL | 0 refills | Status: DC | PRN

## 2016-09-30 NOTE — Progress Notes (Signed)
Patient alert and oriented, pain is controlled. Patient is tolerating fluids, advanced to protein shake today, patient is tolerating well.  Reviewed Gastric sleeve discharge instructions with patient and patient is able to articulate understanding.  Provided information on BELT program, Support Group and [facility] outpatient pharmacy. All questions answered, will continue to monitor.  

## 2016-09-30 NOTE — Progress Notes (Signed)
Assessment unchanged. Pt and husband verbalized understanding of dc instructions through teach back including follow up care and when to call the doctor. Discharged via wc to front entrance accompanied by NT and husband.

## 2016-09-30 NOTE — Progress Notes (Signed)
Pt denies nausea with pain 3/10. Tolerating water and ice well. 2-4 oz vanilla Premier Shake given to pt to sip slowly per Dr. Dois Davenport order.

## 2016-09-30 NOTE — Plan of Care (Signed)
Problem: Altered GI Function ([place]-1.4) Goal: Nutrition education Formal process to instruct or train a patient/client in a skill or to impart knowledge to help patients/clients voluntarily manage or modify food choices and eating behavior to maintain or improve health. Outcome: Completed/Met Date Met: 09/30/16 Nutrition Education Note  Received consult for diet education per DROP protocol.   Patient has already tolerated water well. Reports she has Premier Protein at home. Does not have chewable calcium supplements yet. Reviewed which choices are appropriate and where to purchase. No other questions at this time.  Discussed 2 week post op diet with pt. Emphasized that liquids must be non carbonated, non caffeinated, and sugar free. Fluid goals discussed. Pt to follow up with outpatient bariatric RD for further diet progression after 2 weeks. Multivitamins and minerals also reviewed. Teach back method used, pt expressed understanding, expect good compliance.   Diet: First 2 Weeks  You will see the nutritionist about two (2) weeks after your surgery. The nutritionist will increase the types of foods you can eat if you are handling liquids well:  If you have severe vomiting or nausea and cannot handle clear liquids lasting longer than 1 day, call your surgeon  Protein Shake  Drink at least 2 ounces of shake 5-6 times per day  Each serving of protein shakes (usually 8 - 12 ounces) should have a minimum of:  15 grams of protein  And no more than 5 grams of carbohydrate  Goal for protein each day:  Men = 80 grams per day  Women = 60 grams per day  Protein powder may be added to fluids such as non-fat milk or Lactaid milk or Soy milk (limit to 35 grams added protein powder per serving)   Hydration  Slowly increase the amount of water and other clear liquids as tolerated (See Acceptable Fluids)  Slowly increase the amount of protein shake as tolerated  Sip fluids slowly and throughout the day   May use sugar substitutes in small amounts (no more than 6 - 8 packets per day; i.e. Splenda)   Fluid Goal  The first goal is to drink at least 8 ounces of protein shake/drink per day (or as directed by the nutritionist); some examples of protein shakes are [employer] Johnson, [employer], EAS Edge HP, and Unjury. See handout from pre-op Bariatric Education Class:  Slowly increase the amount of protein shake you drink as tolerated  You may find it easier to slowly sip shakes throughout the day  It is important to get your proteins in first  Your fluid goal is to drink 64 - 100 ounces of fluid daily  It may take a few weeks to build up to this  32 oz (or more) should be clear liquids  And  32 oz (or more) should be full liquids (see below for examples)  Liquids should not contain sugar, caffeine, or carbonation   Clear Liquids:  Water or Sugar-free flavored water (i.e. Fruit H2O, Propel)  Decaffeinated coffee or tea (sugar-free)  Crystal Lite, Wyler's Lite, Minute Maid Lite  Sugar-free Jell-O  Bouillon or broth  Sugar-free Popsicle: *Less than 20 calories each; Limit 1 per day   Full Liquids:  Protein Shakes/Drinks + 2 choices per day of other full liquids  Full liquids must be:  No More Than 12 grams of Carbs per serving  No More Than 3 grams of Fat per serving  Strained low-fat cream soup  Non-Fat milk  Fat-free Lactaid Milk  Sugar-free yogurt (Dannon  Lite & Fit, Greek yogurt)

## 2016-09-30 NOTE — Progress Notes (Signed)
Patient ID: [name] [name], female   DOB: 1961/08/01, 55 y.o.   MRN: [id number]  Progress Note: Metabolic and Bariatric Surgery Service   Subjective: Doing well. No nausea. Some sensation that liquids sit in upper abd for a min or sec with some discomfort. Ambulated. Took in 60 ml water  Objective: Vital signs in last 24 hours: Temp:  [97.8 F (36.6 C)-99 F (37.2 C)] 98.8 F (37.1 C) (11/07 0900) Pulse Rate:  [71-101] 82 (11/07 0900) Resp:  [15-27] 16 (11/07 0900) BP: (113-151)/(64-106) 145/94 (11/07 0900) SpO2:  [97 %-100 %] 97 % (11/07 0900) FiO2 (%):  [28 %] 28 % (11/06 1329) Weight:  [131.2 kg (289 lb 4.8 oz)] 131.2 kg (289 lb 4.8 oz) (11/07 0900) Last BM Date: 09/29/16  Intake/Output from previous day: 11/06 0701 - 11/07 0700 In: 3585 [P.O.:60; I.V.:3375; IV Piggyback:150] Out: 455 [Urine:450; Blood:5] Intake/Output this shift: Total I/O In: 500 [I.V.:500] Out: -   Lungs: cta  Cardiovascular: reg  Abd: soft, mild approp ttp, incisions c/d/i; nd  Extremities: no edema, +SCDs  Neuro: approp, nad  Lab Results: CBC   Recent Labs  09/29/16 1241 09/30/16 0444  WBC  --  10.4  HGB 11.7* 11.5*  HCT 35.8* 35.5*  PLT  --  270   BMET  Recent Labs  09/30/16 0444  NA 138  K 3.8  CL 103  CO2 26  GLUCOSE 149*  BUN 13  CREATININE 0.69  CALCIUM 8.9   PT/INR No results for input(s): LABPROT, INR in the last 72 hours. ABG No results for input(s): PHART, HCO3 in the last 72 hours.  Invalid input(s): PCO2, PO2  Studies/Results:  Anti-infectives: Anti-infectives    Start     Dose/Rate Route Frequency Ordered Stop   09/29/16 0830  levofloxacin (LEVAQUIN) IVPB 750 mg     750 mg 100 mL/hr over 90 Minutes Intravenous On call to O.R. 09/29/16 0804 09/29/16 1007      Medications: Scheduled Meds: . acetaminophen  1,000 mg Intravenous Q6H  . enoxaparin (LOVENOX) injection  30 mg Subcutaneous Q12H  . famotidine (PEPCID) IV  20 mg Intravenous Q12H  .  ondansetron (ZOFRAN) IV  4 mg Intravenous Q6H  . [START ON 10/01/2016] protein supplement shake  2 oz Oral Q2H   Continuous Infusions: . dextrose 5 % and 0.45 % NaCl with KCl 20 mEq/L 125 mL/hr at 09/30/16 0600   PRN Meds:.oxyCODONE **AND** acetaminophen, acetaminophen (TYLENOL) oral liquid 160 mg/5 mL, albuterol, alum & mag hydroxide-simeth, diphenhydrAMINE, morphine injection, promethazine  Assessment/Plan: Patient Active Problem List   Diagnosis Date Noted  . Prediabetes 09/29/2016  . 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 Obstructive sleep apnea 09/18/2016  . 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 Itching-Bilateral dorsum foot 07/11/2015  . Pain in joint, ankle and foot 07/11/2015  . Foot swelling 07/11/2015  . FUO (fever of unknown origin) 03/14/2012  . Hypokalemia 03/14/2012  . Hyponatremia 03/14/2012  . Dehydration 03/14/2012  . LBBB (left bundle branch block) 03/14/2012  . Obesity, morbid ([place]) 03/07/2008  . Essential hypertension 03/07/2008  . [place] DISEASE 03/07/2008  . ABSCESS, PERIRECTAL 03/07/2008   s/p Procedure(s): LAPAROSCOPIC GASTRIC SLEEVE RESECTION, UPPER ENDO UPPER GI ENDOSCOPY 09/29/2016  Doing well No fever, no tachycardia Cont POD 1 diet, if tolerates adv to protein shakes Cont chemical vte prophylaxis prob dc late today if meets fluid intake goals  Disposition:  LOS: 1 day  The patient will be in the hospital for normal postop protocol  [name], MD [phone] [facility]

## 2016-10-01 NOTE — Discharge Summary (Signed)
Physician Discharge Summary  [name] [name] HFG:[id number] DOB: [birth year] DOA: 09/29/2016  PCP: Jonathon Bellows, MD  Admit date: 09/29/2016 Discharge date: 09/30/2016  Recommendations for Outpatient Follow-up:    Follow-up Information    [name] Curry, MD. Schedule an appointment as soon as possible for a visit in 2 week(s).   Specialty:  General Surgery Why:  for post-op check Contact information: [place] [address] [phone]          Discharge Diagnoses:  Principal Problem:   Obesity, morbid ([place]) Active Problems:   Essential hypertension   Obstructive sleep apnea   Prediabetes   S/P laparoscopic sleeve gastrectomy   Surgical Procedure: Laparoscopic Sleeve Gastrectomy, upper endoscopy  Discharge Condition: Good Disposition: Home  Diet recommendation: Postoperative sleeve gastrectomy diet (liquids only)  Filed Weights   09/29/16 0824 09/29/16 0826 09/30/16 0900  Weight: 128.5 kg (283 lb 4 oz) 128.5 kg (283 lb 6.4 oz) 131.2 kg (289 lb 4.8 oz)     Hospital Course:  The patient was admitted for a planned laparoscopic sleeve gastrectomy. Please see operative note. Preoperatively the patient was given 5000 units of subcutaneous heparin for DVT prophylaxis. Postoperative prophylactic Lovenox dosing was started on the morning of postoperative day 1. On the evening of postoperative day 0, the patient was started on water and ice chips. On postoperative day 1 the patient had no fever or tachycardia and was tolerating water in their diet was gradually advanced throughout the day. The patient was ambulating without difficulty. Their vital signs are stable without fever or tachycardia. Their hemoglobin had remained stable. The patient was maintained on their home settings for CPAP therapy. The patient had received discharge instructions and counseling. They were deemed stable for discharge and had met discharge criteria   Discharge  Instructions  Discharge Instructions    Ambulate hourly while awake    Complete by:  As directed    Call MD for:  difficulty breathing, headache or visual disturbances    Complete by:  As directed    Call MD for:  persistant dizziness or light-headedness    Complete by:  As directed    Call MD for:  persistant nausea and vomiting    Complete by:  As directed    Call MD for:  redness, tenderness, or signs of infection (pain, swelling, redness, odor or green/yellow discharge around incision site)    Complete by:  As directed    Call MD for:  severe uncontrolled pain    Complete by:  As directed    Call MD for:  temperature >101 F    Complete by:  As directed    Diet bariatric full liquid    Complete by:  As directed    Discharge instructions    Complete by:  As directed    See bariatric discharge instructions   Incentive spirometry    Complete by:  As directed    Perform hourly while awake       Medication List    STOP taking these medications   leflunomide 10 MG tablet Commonly known as:  ARAVA     TAKE these medications   albuterol 108 (90 Base) MCG/ACT inhaler Commonly known as:  PROVENTIL HFA;VENTOLIN HFA Inhale into the lungs every 6 (six) hours as needed for wheezing or shortness of breath.   fluticasone 50 MCG/ACT nasal spray Commonly known as:  FLONASE INHALE 1 SPRAY INTO EACH NOSTRIL ONCE DAILY   folic acid 1 MG  tablet Commonly known as:  FOLVITE Take 1 tablet by mouth daily.   oxyCODONE 5 MG/5ML solution Commonly known as:  ROXICODONE Take 5-10 mLs (5-10 mg total) by mouth every 4 (four) hours as needed for moderate pain or severe pain.   quinapril-hydrochlorothiazide 20-12.5 MG tablet Commonly known as:  ACCURETIC Take 1 tablet by mouth daily. Notes to patient:  Monitor Blood Pressure Daily and keep a log for primary care physician.  Monitor for symptoms of dehydration.  You may need to make changes to your medications with rapid weight loss.      valACYclovir 500 MG tablet Commonly known as:  VALTREX TAKE 1 TABLET BY MOUTH EVERY DAY What changed:  See the new instructions.      Follow-up Information    [name] Curry, MD. Schedule an appointment as soon as possible for a visit in 2 week(s).   Specialty:  General Surgery Why:  for post-op check Contact information: [place] [address] [phone]            The results of significant diagnostics from this hospitalization (including imaging, microbiology, ancillary and laboratory) are listed below for reference.    Significant Diagnostic Studies: No results found.  Labs: Basic Metabolic Panel:  Recent Labs Lab 09/25/16 0905 09/30/16 0444  NA 138 138  K 3.2* 3.8  CL 99* 103  CO2 30 26  GLUCOSE 98 149*  BUN 20 13  CREATININE 0.64 0.69  CALCIUM 9.6 8.9   Liver Function Tests:  Recent Labs Lab 09/25/16 0905 09/30/16 0444  AST 24 31  ALT 23 28  ALKPHOS 81 74  BILITOT 0.3 0.3  PROT 7.8 7.5  ALBUMIN 3.9 3.7    CBC:  Recent Labs Lab 09/25/16 0905 09/29/16 1241 09/30/16 0444  WBC 6.0  --  10.4  NEUTROABS 4.1  --  8.6*  HGB 12.1 11.7* 11.5*  HCT 37.6 35.8* 35.5*  MCV 85.5  --  85.3  PLT 292  --  270    CBG: No results for input(s): GLUCAP in the last 168 hours.  Principal Problem:   Obesity, morbid ([place]) Active Problems:   Essential hypertension   Obstructive sleep apnea   Prediabetes   S/P laparoscopic sleeve gastrectomy   Time coordinating discharge: 46  Min  Signed:  [name], MD [facility] [place] 10/01/2016, 8:37 AM

## 2016-10-14 ENCOUNTER — Encounter: Payer: 59 | Attending: General Surgery

## 2016-10-14 DIAGNOSIS — M0579 Rheumatoid arthritis with rheumatoid factor of multiple sites without organ or systems involvement: Secondary | ICD-10-CM | POA: Insufficient documentation

## 2016-10-14 DIAGNOSIS — R0683 Snoring: Secondary | ICD-10-CM | POA: Diagnosis not present

## 2016-10-14 DIAGNOSIS — I10 Essential (primary) hypertension: Secondary | ICD-10-CM | POA: Insufficient documentation

## 2016-10-14 DIAGNOSIS — Z6841 Body Mass Index (BMI) 40.0 and over, adult: Secondary | ICD-10-CM | POA: Diagnosis not present

## 2016-10-14 DIAGNOSIS — Z713 Dietary counseling and surveillance: Secondary | ICD-10-CM | POA: Diagnosis present

## 2016-10-18 NOTE — Progress Notes (Signed)
Bariatric Class:  Appt start time: 1530 end time:  1630.  2 Week Post-Operative Nutrition Class  Patient was seen on 10/14/16 for Post-Operative Nutrition education at [facility].   Surgery date: 09/29/2016 Surgery type: Sleeve gastrectomy Start weight at [facility]: 295 lbs on 07/01/2016 Weight today: 271.2 lbs  Weight change: 24.6 lbs  TANITA  BODY COMP RESULTS  08/25/16 10/14/16   BMI (kg/m^2) 50.8 46.6   Fat Mass (lbs) 166.6 144.0   Fat Free Mass (lbs) 129.2 127.2   Total Body Water (lbs) 96.2 93.6   The following the learning objectives were met by the patient during this course:  Identifies Phase 3A (Soft, High Proteins) Dietary Goals and will begin from 2 weeks post-operatively to 2 months post-operatively  Identifies appropriate sources of fluids and proteins   States protein recommendations and appropriate sources post-operatively  Identifies the need for appropriate texture modifications, mastication, and bite sizes when consuming solids  Identifies appropriate multivitamin and calcium sources post-operatively  Describes the need for physical activity post-operatively and will follow MD recommendations  States when to call healthcare provider regarding medication questions or post-operative complications  Handouts given during class include:  Phase 3A: Soft, High Protein Diet Handout  Follow-Up Plan: Patient will follow-up at [facility] in 6 weeks for 2 month post-op nutrition visit for diet advancement per MD.

## 2016-10-29 ENCOUNTER — Institutional Professional Consult (permissible substitution): Payer: Self-pay | Admitting: Pulmonary Disease

## 2016-11-25 ENCOUNTER — Encounter: Payer: BLUE CROSS/BLUE SHIELD | Attending: General Surgery | Admitting: Dietician

## 2016-11-25 DIAGNOSIS — I10 Essential (primary) hypertension: Secondary | ICD-10-CM | POA: Insufficient documentation

## 2016-11-25 DIAGNOSIS — R0683 Snoring: Secondary | ICD-10-CM | POA: Insufficient documentation

## 2016-11-25 DIAGNOSIS — M0579 Rheumatoid arthritis with rheumatoid factor of multiple sites without organ or systems involvement: Secondary | ICD-10-CM | POA: Insufficient documentation

## 2016-11-25 DIAGNOSIS — Z6841 Body Mass Index (BMI) 40.0 and over, adult: Secondary | ICD-10-CM | POA: Insufficient documentation

## 2016-11-25 DIAGNOSIS — Z713 Dietary counseling and surveillance: Secondary | ICD-10-CM | POA: Diagnosis not present

## 2016-11-25 NOTE — Patient Instructions (Addendum)
Goals:  Follow Phase 3B: High Protein + Non-Starchy Vegetables  Eat 3-6 small meals/snacks, every 3-5 hrs  Increase lean protein foods to meet 60g goal  Aim to eat 2 oz of protein for lunch and dinner meats  Eat protein foods first, then vegetables   Cut out the carbs   Increase fluid intake to 64oz +  Try hot beverages  Avoid drinking 15 minutes before, during and 30 minutes after eating  Aim for >30 min of physical activity daily  Try G Hughes Barbeque sauce (sugar free) from [place]   Surgery date: 09/29/2016 Surgery type: Sleeve gastrectomy Start weight at [facility]: 295 lbs on 07/01/2016 Weight today: 261.0 lbs  Weight change: 10 lbs Total Weight loss: 34 lbs  TANITA  BODY COMP RESULTS  08/25/16 10/14/16 11/25/16   BMI (kg/m^2) 50.8 46.6 44.8   Fat Mass (lbs) 166.6 144.0 133.6   Fat Free Mass (lbs) 129.2 127.2 127.4   Total Body Water (lbs) 96.2 93.6 93.4

## 2016-11-25 NOTE — Progress Notes (Signed)
  Follow-up visit:  8 Weeks Post-Operative Sleeve Gastrectomy Surgery  Medical Nutrition Therapy:  Appt start time: 0815 end time:  0855.  Primary concerns today: Post-operative Bariatric Surgery Nutrition Management. Returns with a 10 lb weight loss. Has had some heartburn some with carbs/fatty foods. States she has been "cheating" during the holidays. Felt bad when eating foods she shouldn't but did not vomit.   Not feeling very hungry. Feels like she needs to use a smaller plate. Concerned that she is not getting in water and having trouble drinking and getting in food.   Preferred Learning Style:   No preference indicated   Learning Readiness:   Ready  Surgery date: 09/29/2016 Surgery type: Sleeve gastrectomy Start weight at [facility]: 295 lbs on 07/01/2016 Weight today: 261.0 lbs  Weight change: 10 lbs Total Weight loss: 34 lbs  TANITA  BODY COMP RESULTS  08/25/16 10/14/16 11/25/16   BMI (kg/m^2) 50.8 46.6 44.8   Fat Mass (lbs) 166.6 144.0 133.6   Fat Free Mass (lbs) 129.2 127.2 127.4   Total Body Water (lbs) 96.2 93.6 93.4    24-hr recall: B (AM): Premier Protein shake (30 g) Snk (AM): none recently but has had some greens for snacks L (PM): 2 slices deli meat with stuffing or potato salad or chicken with regular barbeque sauce (eats skin instead of meat) or regular cheese and banana pudding or 1/2 sweet Hawaiian bread (around 14 g) Snk (PM): see above - greens or deli meat or 1/2 protein shake  D (PM): none or similar to lunch foods Snk (PM): none   Fluid intake: has had regular coffee once, 42-51 oz water, 11-17 oz protein shake (53-60oz) Estimated total protein intake: 44-60 g  Medications: see list  Supplementation: taking Bariatric Advantage EA, taking calcium 3 x day  Using straws: No Drinking while eating: No Hair loss: No Carbonated beverages: No N/V/D/C: No Dumping syndrome: No  Recent physical activity:  Not yet, waiting to get cleared for  exercise  Progress Towards Goal(s):  In progress.  Handouts given during visit include:  Phase 3B High Protein + Non Starchy Vegetables   Nutritional Diagnosis:  [place]-3.3 Overweight/obesity related to past poor dietary habits and physical inactivity as evidenced by patient w/ recent sleeve gastrectomy surgery following dietary guidelines for continued weight loss.    Intervention:  Nutrition education/diet advancement. Goals:  Follow Phase 3B: High Protein + Non-Starchy Vegetables  Eat 3-6 small meals/snacks, every 3-5 hrs  Increase lean protein foods to meet 60g goal  Aim to eat 2 oz of protein for lunch and dinner meats  Eat protein foods first, then vegetables   Cut out the carbs   Increase fluid intake to 64oz +  Try hot beverages  Avoid drinking 15 minutes before, during and 30 minutes after eating  Aim for >30 min of physical activity daily  Try G Hughes Barbeque sauce (sugar free) from [employer]   Teaching Method Utilized:  Visual Auditory Hands on  Barriers to learning/adherence to lifestyle change: none  Demonstrated degree of understanding via:  Teach Back   Monitoring/Evaluation:  Dietary intake, exercise, and body weight. Follow up in 1 months for 3 month post-op visit.

## 2016-12-19 ENCOUNTER — Ambulatory Visit: Payer: Self-pay | Admitting: Internal Medicine

## 2016-12-31 ENCOUNTER — Encounter: Payer: Self-pay | Admitting: Dietician

## 2016-12-31 ENCOUNTER — Encounter: Payer: BLUE CROSS/BLUE SHIELD | Attending: General Surgery | Admitting: Dietician

## 2016-12-31 DIAGNOSIS — Z6841 Body Mass Index (BMI) 40.0 and over, adult: Secondary | ICD-10-CM | POA: Diagnosis not present

## 2016-12-31 DIAGNOSIS — R0683 Snoring: Secondary | ICD-10-CM | POA: Insufficient documentation

## 2016-12-31 DIAGNOSIS — I10 Essential (primary) hypertension: Secondary | ICD-10-CM | POA: Insufficient documentation

## 2016-12-31 DIAGNOSIS — M0579 Rheumatoid arthritis with rheumatoid factor of multiple sites without organ or systems involvement: Secondary | ICD-10-CM | POA: Diagnosis not present

## 2016-12-31 DIAGNOSIS — Z713 Dietary counseling and surveillance: Secondary | ICD-10-CM | POA: Diagnosis not present

## 2016-12-31 NOTE — Progress Notes (Signed)
  Follow-up visit:  3 months Post-Operative Sleeve Gastrectomy Surgery  Medical Nutrition Therapy:  Appt start time: [id number] end time:  905  Primary concerns today: Post-operative Bariatric Surgery Nutrition Management. Returns with a 9 lb weight loss. She states she is happy to be losing 10 lbs a month. Starting to walk more after getting cleared for exercise. States that she is meeting her protein needs, drinks 1 protein shake per day and maybe 2 if she isn't getting her protein in with food.   Preferred Learning Style:   No preference indicated   Learning Readiness:   Ready  Surgery date: 09/29/2016 Surgery type: Sleeve gastrectomy Start weight at [facility]: 295 lbs on 07/01/2016 Weight today: 252 lbs Weight change: 9 lbs Total Weight loss: 43 lbs  TANITA  BODY COMP RESULTS  08/25/16 10/14/16 11/25/16 12/31/16   BMI (kg/m^2) 50.8 46.6 44.8 43.3   Fat Mass (lbs) 166.6 144.0 133.6 128.2   Fat Free Mass (lbs) 129.2 127.2 127.4 123.8   Total Body Water (lbs) 96.2 93.6 93.4 90.4    24-hr recall: B (9-11 AM): Premier Protein shake (30 g) Snk (1PM): 2-3 oz cheese and 4-6 Ritz crackers (12-18g) L (3:30PM): cabbage or spinach (<1g) D (PM): either another cheese snack or protein shake (18-30g) Snk (PM): none   Fluid intake: has had regular coffee once, 42-51 oz water, 11-17 oz protein shake (53-60oz) Estimated total protein intake: 60-80 g  Medications: see list  Supplementation: taking Bariatric Advantage EA, taking calcium 3 x day  Using straws: No Drinking while eating: No Hair loss: No Carbonated beverages: No N/V/D/C: No Dumping syndrome: No  Recent physical activity:  Walking most days at work  Progress Towards Goal(s):  In progress.  Handouts given during visit include:  none   Nutritional Diagnosis:  [place]-3.3 Overweight/obesity related to past poor dietary habits and physical inactivity as evidenced by patient w/ recent sleeve gastrectomy surgery following dietary guidelines  for continued weight loss.    Intervention:  Nutrition education/diet advancement. Goals:  Follow Phase 3B: High Protein + Non-Starchy Vegetables  Eat 3-6 small meals/snacks, every 3-5 hrs  Increase lean protein foods to meet 60g goal  Aim to eat 2 oz of protein for lunch and dinner meats  Eat protein foods first, then vegetables   Cut out the carbs   Increase fluid intake to 64oz +  Try hot beverages  Avoid drinking 15 minutes before, during and 30 minutes after eating  Aim for >30 min of physical activity daily  Try G Hughes Barbeque sauce (sugar free) from [employer]   Teaching Method Utilized:  Visual Auditory Hands on  Barriers to learning/adherence to lifestyle change: none  Demonstrated degree of understanding via:  Teach Back   Monitoring/Evaluation:  Dietary intake, exercise, and body weight. Follow up in 2 months for 5 month post-op visit.

## 2016-12-31 NOTE — Patient Instructions (Addendum)
Goals:  Follow Phase 3B: High Protein + Non-Starchy Vegetables  Eat 3-6 small meals/snacks, every 3-5 hrs  Increase lean protein foods to meet 60g goal  Aim to eat 2 oz of protein for lunch and dinner meats  Eat protein foods first, then vegetables   Cut out the carbs   Increase fluid intake to 64oz +  Try hot beverages  Avoid drinking 15 minutes before, during and 30 minutes after eating  Aim for >30 min of physical activity daily  Try G Hughes Barbeque sauce (sugar free) from [employer]  Pre-portion everything!!  Keep increasing exercise  Make sure to get protein at least 3x a day  Support group: 3rd Thursday of each month from 6-7pm in [employer] classroom  Surgery date: 09/29/2016 Surgery type: Sleeve gastrectomy Start weight at [facility]: 295 lbs on 07/01/2016 Weight today: 252 lbs Weight change: 9 lbs Total Weight loss: 43 lbs  TANITA  BODY COMP RESULTS  08/25/16 10/14/16 11/25/16 12/31/16   BMI (kg/m^2) 50.8 46.6 44.8 43.3   Fat Mass (lbs) 166.6 144.0 133.6 128.2   Fat Free Mass (lbs) 129.2 127.2 127.4 123.8   Total Body Water (lbs) 96.2 93.6 93.4 90.4

## 2017-01-05 DIAGNOSIS — M5416 Radiculopathy, lumbar region: Secondary | ICD-10-CM | POA: Diagnosis not present

## 2017-01-05 DIAGNOSIS — M0579 Rheumatoid arthritis with rheumatoid factor of multiple sites without organ or systems involvement: Secondary | ICD-10-CM | POA: Diagnosis not present

## 2017-01-05 DIAGNOSIS — M2550 Pain in unspecified joint: Secondary | ICD-10-CM | POA: Diagnosis not present

## 2017-01-10 ENCOUNTER — Encounter (HOSPITAL_COMMUNITY): Payer: Self-pay | Admitting: Emergency Medicine

## 2017-01-10 ENCOUNTER — Ambulatory Visit (HOSPITAL_COMMUNITY)
Admission: EM | Admit: 2017-01-10 | Discharge: 2017-01-10 | Disposition: A | Discharge status: Home / Self Care | Payer: BLUE CROSS/BLUE SHIELD | Attending: Family Medicine | Admitting: Family Medicine

## 2017-01-10 DIAGNOSIS — M5432 Sciatica, left side: Secondary | ICD-10-CM | POA: Diagnosis not present

## 2017-01-10 MED ORDER — HYDROCODONE-ACETAMINOPHEN 5-325 MG PO TABS: 2.0000 | ORAL_TABLET | ORAL | 0 refills | Status: DC | PRN

## 2017-01-10 MED ORDER — PREDNISONE 20 MG PO TABS: 20.0000 mg | ORAL_TABLET | Freq: Every day | ORAL | 0 refills | Status: AC

## 2017-01-10 MED ORDER — KETOROLAC TROMETHAMINE 60 MG/2ML IM SOLN
60.0000 mg | Freq: Once | INTRAMUSCULAR | Status: AC
  Administered 2017-01-10: 60 mg via INTRAMUSCULAR

## 2017-01-10 MED ORDER — PREDNISONE 20 MG PO TABS: 20.0000 mg | ORAL_TABLET | Freq: Every day | ORAL | 0 refills | Status: DC

## 2017-01-10 MED ORDER — KETOROLAC TROMETHAMINE 60 MG/2ML IM SOLN
INTRAMUSCULAR | Status: AC
  Filled 2017-01-10: qty 2

## 2017-01-10 NOTE — ED Triage Notes (Signed)
PT reports left hip and back pain. PT reports no injury. Pain has been present for 3 days. History of RA

## 2017-01-10 NOTE — ED Provider Notes (Signed)
CSN: [id number]     Arrival date & time 01/10/17  1805 History   First MD Initiated Contact with Patient 01/10/17 1926     Chief Complaint  Patient presents with  . Hip Pain   (Consider location/radiation/quality/duration/timing/severity/associated sxs/prior Treatment) Patient is 56 y.o. Female with hx of herniated disc in lumbar spine, RA of back, ankle and hip, and chronic back pain, presents today for worsening in chronic back pain for 3 days duration with a constant dull 8/10 with occasionally sharp, spasm, and shooting pain of 10/10 radiating down the left hip and down the left left. She reports that this has happened once before in the past. She denies numbness and tingling sensation. She denies muscle weakness. She reports walking and certain movements makes the pain worse. She have tried ibuprofen and "has not done anything for this pain".       Past Medical History:  Diagnosis Date  . Asthma   . Hypertension   . Lumbar herniated disc   . Pre-diabetes   . Rheumatoid arthritis ([place])   . Sleep apnea    has not received CPAP yet    Past Surgical History:  Procedure Laterality Date  . BREAST SURGERY     breast reduction   . LAPAROSCOPIC GASTRIC SLEEVE RESECTION N/A 09/29/2016   Procedure: LAPAROSCOPIC GASTRIC SLEEVE RESECTION, UPPER ENDO;  Surgeon: Greer Pickerel, MD;  Location: [facility];  Service: General;  Laterality: N/A;  . removed bone from left knee     . UPPER GI ENDOSCOPY  09/29/2016   Procedure: UPPER GI ENDOSCOPY;  Surgeon: Greer Pickerel, MD;  Location: [facility];  Service: General;;   Family History  Problem Relation Age of Onset  . Ovarian cancer Mother 46    Not sure of this diagnosis  . Diabetes Brother   . Hypertension Brother    Social History  Substance Use Topics  . Smoking status: Never Smoker  . Smokeless tobacco: Never Used  . Alcohol use No   OB History    No data available     Review of Systems  Constitutional:       As stated in the HPI     Allergies  Penicillins  Home Medications   Prior to Admission medications   Medication Sig Start Date End Date Taking? Authorizing Provider  albuterol (PROVENTIL HFA;VENTOLIN HFA) 108 (90 Base) MCG/ACT inhaler Inhale into the lungs every 6 (six) hours as needed for wheezing or shortness of breath.    Historical Provider, MD  dextrose 5 % SOLN 50 mL with methotrexate 25 MG/ML (PF) SOLN 40 mg/m2 Inject 40 mg/m2 into the vein.    Historical Provider, MD  fluticasone (FLONASE) 50 MCG/ACT nasal spray INHALE 1 SPRAY INTO EACH NOSTRIL ONCE DAILY 05/11/15   Historical Provider, MD  folic acid (FOLVITE) 1 MG tablet Take 1 tablet by mouth daily. 08/05/16   Historical Provider, MD  HYDROcodone-acetaminophen (NORCO/VICODIN) 5-325 MG tablet Take 2 tablets by mouth every 4 (four) hours as needed. 01/10/17   Barry Dienes, NP  oxyCODONE (ROXICODONE) 5 MG/5ML solution Take 5-10 mLs (5-10 mg total) by mouth every 4 (four) hours as needed for moderate pain or severe pain. 09/30/16   Greer Pickerel, MD  predniSONE (DELTASONE) 20 MG tablet Take 1 tablet (20 mg total) by mouth daily with breakfast. 60 mg from day 1-2, 40 mg from day 3-4, 20 mg day 5-6. 01/10/17 01/16/17  Barry Dienes, NP  quinapril-hydrochlorothiazide (ACCURETIC) 20-12.5 MG per tablet Take 1 tablet by  mouth daily.    Historical Provider, MD  valACYclovir (VALTREX) 500 MG tablet TAKE 1 TABLET BY MOUTH EVERY DAY Patient taking differently: TAKE 1 TABLET BY MOUTH EVERY DAY AS NEEDED FOR FLARE UP 06/14/15   Thao P Le, DO   Meds Ordered and Administered this Visit   Medications  ketorolac (TORADOL) injection 60 mg (60 mg Intramuscular Given 01/10/17 1940)    BP 137/78   Pulse 94   Temp 99.1 F (37.3 C) (Oral)   Resp 16   Ht 5\' 3"  (1.6 m)   Wt 252 lb (114.3 kg)   SpO2 100%   BMI 44.64 kg/m  No data found.   Physical Exam  Constitutional: She appears well-developed and well-nourished.  HENT:  Head: Normocephalic and atraumatic.  Neck: Normal  range of motion.  Cardiovascular: Normal rate, regular rhythm and normal heart sounds.   Pulmonary/Chest: Effort normal and breath sounds normal.  Abdominal: Soft. Bowel sounds are normal. There is no tenderness.  Musculoskeletal:  Has full ROM at the C-spine, T-Spine and L-spine. Spine and its surrounding areas non-tender to palpate. Positive left straight leg raise. Limping in room.   Lymphadenopathy:    She has no cervical adenopathy.  Nursing note and vitals reviewed.   Urgent Care Course     Procedures (including critical care time)  Labs Review Labs Reviewed - No data to display  Imaging Review No results found.  MDM   1. Sciatica of left side    1) Start Hydrocodone-APAP 5-325 mg PRN for pain. [place] registry reviewed 2) Start Prednisone taper x 6 days 3) F/u with either your PCP or neurologist for worsening symptoms     Barry Dienes, NP 01/10/17 1948

## 2017-01-22 ENCOUNTER — Other Ambulatory Visit: Payer: Self-pay | Admitting: Family Medicine

## 2017-01-22 DIAGNOSIS — Z1231 Encounter for screening mammogram for malignant neoplasm of breast: Secondary | ICD-10-CM

## 2017-02-02 DIAGNOSIS — E785 Hyperlipidemia, unspecified: Secondary | ICD-10-CM | POA: Diagnosis not present

## 2017-02-02 DIAGNOSIS — R7303 Prediabetes: Secondary | ICD-10-CM | POA: Diagnosis not present

## 2017-02-02 DIAGNOSIS — I10 Essential (primary) hypertension: Secondary | ICD-10-CM | POA: Diagnosis not present

## 2017-03-02 ENCOUNTER — Encounter: Payer: Self-pay | Admitting: Skilled Nursing Facility1

## 2017-03-02 ENCOUNTER — Encounter: Payer: BLUE CROSS/BLUE SHIELD | Attending: General Surgery | Admitting: Skilled Nursing Facility1

## 2017-03-02 DIAGNOSIS — Z713 Dietary counseling and surveillance: Secondary | ICD-10-CM | POA: Diagnosis not present

## 2017-03-02 DIAGNOSIS — M0579 Rheumatoid arthritis with rheumatoid factor of multiple sites without organ or systems involvement: Secondary | ICD-10-CM | POA: Insufficient documentation

## 2017-03-02 DIAGNOSIS — R0683 Snoring: Secondary | ICD-10-CM | POA: Insufficient documentation

## 2017-03-02 DIAGNOSIS — Z6841 Body Mass Index (BMI) 40.0 and over, adult: Secondary | ICD-10-CM | POA: Insufficient documentation

## 2017-03-02 DIAGNOSIS — I10 Essential (primary) hypertension: Secondary | ICD-10-CM | POA: Diagnosis not present

## 2017-03-02 NOTE — Progress Notes (Signed)
   Medical Nutrition Therapy:  Appt start time: 830 end time:  905  Primary concerns today: Post-operative Bariatric Surgery Nutrition Management. .  Pt states she was taking steroids for a sinus infection which increased her appetite. Pt states she has rheumatoid arthritis. Pt states she works 12pm to 9pm.   Preferred Learning Style:   No preference indicated   Learning Readiness:   Ready  Surgery date: 09/29/2016 Surgery type: Sleeve gastrectomy Start weight at [facility]: 295 lbs on 07/01/2016 Weight today: 244.7 lbs Weight change: 7.3 lbs Total Weight loss: 50.3 lbs  TANITA  BODY COMP RESULTS  08/25/16 10/14/16 11/25/16 12/31/16   BMI (kg/m^2) 50.8 46.6 44.8 43.3   Fat Mass (lbs) 166.6 144.0 133.6 128.2   Fat Free Mass (lbs) 129.2 127.2 127.4 123.8   Total Body Water (lbs) 96.2 93.6 93.4 90.4    24-hr recall: B (9-11 AM): Premier Protein shake (30 g) Snk (1PM):premier protein bar (7g) L (3:30PM): Meat and cabbage or spinach (21g) D (PM): protein bar (7g) Snk (PM): meat and vegetable (21g)               cheese  Fluid intake: regular coffee, 42-51 oz water, 11-17 oz protein shake (53-60oz) Estimated total protein intake: 86 g  Medications: see list  Supplementation: taking Bariatric Advantage EA, taking calcium 3 x day  Using straws: No Drinking while eating: No Hair loss: A little bit Carbonated beverages: No N/V/D/C: No, a little bit of constipation but not bad Dumping syndrome: No Having you been chewing well: yes Chewing/swallowing difficulties: no Changes in vision: no Changes to mood/headaches: no Hair loss/[place] to skin/Changes to nails: no Any difficulty focusing or concentrating: no Sweating: no Dizziness/Lightheaded: no Palpitations: no Abdominal Pain: no  Recent physical activity:  Walking most days at work  Progress Towards Goal(s):  In progress.  Handouts given during visit include:  none   Nutritional Diagnosis:  [place]-3.3 Overweight/obesity  related to past poor dietary habits and physical inactivity as evidenced by patient w/ recent sleeve gastrectomy surgery following dietary guidelines for continued weight loss.    Intervention:  Nutrition education/diet advancement. Goals: -Read the nutrition facts on your protein bar: stop eating them if they have less than 15 grams of protein and 9 grams or more of sugar and if it does not have double digits of fiber  -Keep up your walking; try to find something to do most days of the week when you are feeling well  -Keep up the great Work!!  Teaching Method Utilized:  Visual Auditory Hands on  Barriers to learning/adherence to lifestyle change: none  Demonstrated degree of understanding via:  Teach Back   Monitoring/Evaluation:  Dietary intake, exercise, and body weight. Follow up in 2 months for 5 month post-op visit.

## 2017-03-02 NOTE — Patient Instructions (Addendum)
-  Read the nutrition facts on your protein bar: stop eating them if they have less than 15 grams of protein and 9 grams or more of sugar and if it does not have double digits of fiber  -Keep up your walking; try to find something to do most days of the week when you are feeling well  -Keep up the great Work!!

## 2017-03-04 ENCOUNTER — Ambulatory Visit
Admission: RE | Admit: 2017-03-04 | Discharge: 2017-03-04 | Disposition: A | Discharge status: Home / Self Care | Payer: BLUE CROSS/BLUE SHIELD | Source: Ambulatory Visit | Attending: Family Medicine | Admitting: Family Medicine

## 2017-03-04 DIAGNOSIS — Z1231 Encounter for screening mammogram for malignant neoplasm of breast: Secondary | ICD-10-CM

## 2017-03-13 DIAGNOSIS — M2550 Pain in unspecified joint: Secondary | ICD-10-CM | POA: Diagnosis not present

## 2017-03-13 DIAGNOSIS — M0579 Rheumatoid arthritis with rheumatoid factor of multiple sites without organ or systems involvement: Secondary | ICD-10-CM | POA: Diagnosis not present

## 2017-03-13 DIAGNOSIS — M5416 Radiculopathy, lumbar region: Secondary | ICD-10-CM | POA: Diagnosis not present

## 2017-03-18 DIAGNOSIS — M1990 Unspecified osteoarthritis, unspecified site: Secondary | ICD-10-CM | POA: Diagnosis not present

## 2017-04-14 DIAGNOSIS — M5416 Radiculopathy, lumbar region: Secondary | ICD-10-CM | POA: Diagnosis not present

## 2017-04-14 DIAGNOSIS — M0579 Rheumatoid arthritis with rheumatoid factor of multiple sites without organ or systems involvement: Secondary | ICD-10-CM | POA: Diagnosis not present

## 2017-04-14 DIAGNOSIS — M2550 Pain in unspecified joint: Secondary | ICD-10-CM | POA: Diagnosis not present

## 2017-04-22 DIAGNOSIS — L720 Epidermal cyst: Secondary | ICD-10-CM | POA: Diagnosis not present

## 2017-04-29 DIAGNOSIS — E559 Vitamin D deficiency, unspecified: Secondary | ICD-10-CM | POA: Diagnosis not present

## 2017-04-29 DIAGNOSIS — Z9884 Bariatric surgery status: Secondary | ICD-10-CM | POA: Diagnosis not present

## 2017-05-19 DIAGNOSIS — M0579 Rheumatoid arthritis with rheumatoid factor of multiple sites without organ or systems involvement: Secondary | ICD-10-CM | POA: Diagnosis not present

## 2017-06-01 ENCOUNTER — Encounter: Payer: Self-pay | Admitting: Skilled Nursing Facility1

## 2017-06-01 ENCOUNTER — Encounter: Payer: BLUE CROSS/BLUE SHIELD | Attending: General Surgery | Admitting: Skilled Nursing Facility1

## 2017-06-01 DIAGNOSIS — M0579 Rheumatoid arthritis with rheumatoid factor of multiple sites without organ or systems involvement: Secondary | ICD-10-CM | POA: Diagnosis not present

## 2017-06-01 DIAGNOSIS — Z713 Dietary counseling and surveillance: Secondary | ICD-10-CM | POA: Insufficient documentation

## 2017-06-01 DIAGNOSIS — R0683 Snoring: Secondary | ICD-10-CM | POA: Insufficient documentation

## 2017-06-01 DIAGNOSIS — I10 Essential (primary) hypertension: Secondary | ICD-10-CM | POA: Insufficient documentation

## 2017-06-01 DIAGNOSIS — Z6841 Body Mass Index (BMI) 40.0 and over, adult: Secondary | ICD-10-CM | POA: Insufficient documentation

## 2017-06-01 DIAGNOSIS — E669 Obesity, unspecified: Secondary | ICD-10-CM

## 2017-06-01 NOTE — Patient Instructions (Addendum)
-  Try to use vegetables to fill your appetite   -Try to stop drinking an hour before bed  -Walk when you can, take advantage of good days  -Work on stretching; youtube video "RA stretching", Stretch at least once everyday   -Try to find sugar free italian ice  -Try dried edamame

## 2017-06-01 NOTE — Progress Notes (Signed)
   Medical Nutrition Therapy:  Appt start time: 830 end time:  905  Primary concerns today: Post-operative Bariatric Surgery Nutrition Management. .  Pt states she was taking steroids for a sinus infection which increased her appetite. Pt states she has rheumatoid arthritis. Pt states she works 12pm to 9pm.    Pt states she started infusions for her RA and this has increased her appetite. Pt states she may need back surgery.   Preferred Learning Style:   No preference indicated   Learning Readiness:   Ready  Surgery date: 09/29/2016 Surgery type: Sleeve gastrectomy [facility] weight at [facility]: 295 lbs on 07/01/2016 Weight today: 252.2 lbs    TANITA  BODY COMP RESULTS  08/25/16 10/14/16 11/25/16 12/31/16 06/01/2017   BMI (kg/m^2) 50.8 46.6 44.8 43.3 44.7   Fat Mass (lbs) 166.6 144.0 133.6 128.2 133.4   Fat Free Mass (lbs) 129.2 127.2 127.4 123.8 118.8   Total Body Water (lbs) 96.2 93.6 93.4 90.4 87    24-hr recall: B (9-11 AM): 2 Premier Protein shake (30 g) Snk (1PM):2 premier protein bar (7g)----cheese L (3:30PM): Meat and cabbage or spinach (21g) Snack: peanuts D (PM): protein bar (7g) peanuts  Snk (PM): meat and cheese (21g)                Fluid intake: regular coffee, 64 oz water, 11-17 oz protein shake (53-60oz) Estimated total protein intake: 86 g  Medications: see list  Supplementation: taking Bariatric Advantage EA, taking calcium 3 x day  Using straws: No Drinking while eating: No Hair loss: A little bit Carbonated beverages: No N/V/D/C: No, a little bit of constipation but not bad Dumping syndrome: No Having you been chewing well: yes Chewing/swallowing difficulties: no Changes in vision: no Changes to mood/headaches: no Hair loss/Changes to skin/Changes to nails: no Any difficulty focusing or concentrating: no Sweating: no Dizziness/Lightheaded: no Palpitations: no Abdominal Pain: no  Recent physical activity:  ADL's  Progress Towards Goal(s):  In  progress.  Handouts given during visit include:  none   Nutritional Diagnosis:  [place]-3.3 Overweight/obesity related to past poor dietary habits and physical inactivity as evidenced by patient w/ recent sleeve gastrectomy surgery following dietary guidelines for continued weight loss.    Intervention:  Nutrition education/diet advancement. Goals: -Try to use vegetables to fill your appetite  -Try to stop drinking an hour before bed -Walk when you can, take advantage of good days -Work on stretching; youtube video "RA stretching", Stretch at least once everyday  -Try to find sugar free italian ice -Try dried edamame   Teaching Method Utilized:  Visual Auditory Hands on  Barriers to learning/adherence to lifestyle change: none  Demonstrated degree of understanding via:  Teach Back   Monitoring/Evaluation:  Dietary intake, exercise, and body weight. Follow up in 2 months for 5 month post-op visit.

## 2017-06-16 DIAGNOSIS — M0579 Rheumatoid arthritis with rheumatoid factor of multiple sites without organ or systems involvement: Secondary | ICD-10-CM | POA: Diagnosis not present

## 2017-07-15 DIAGNOSIS — M2550 Pain in unspecified joint: Secondary | ICD-10-CM | POA: Diagnosis not present

## 2017-07-15 DIAGNOSIS — M5416 Radiculopathy, lumbar region: Secondary | ICD-10-CM | POA: Diagnosis not present

## 2017-07-15 DIAGNOSIS — M0579 Rheumatoid arthritis with rheumatoid factor of multiple sites without organ or systems involvement: Secondary | ICD-10-CM | POA: Diagnosis not present

## 2017-07-19 DIAGNOSIS — J40 Bronchitis, not specified as acute or chronic: Secondary | ICD-10-CM | POA: Diagnosis not present

## 2017-07-19 DIAGNOSIS — J019 Acute sinusitis, unspecified: Secondary | ICD-10-CM | POA: Diagnosis not present

## 2017-07-19 DIAGNOSIS — Z9109 Other allergy status, other than to drugs and biological substances: Secondary | ICD-10-CM | POA: Diagnosis not present

## 2017-07-19 DIAGNOSIS — R0600 Dyspnea, unspecified: Secondary | ICD-10-CM | POA: Diagnosis not present

## 2017-07-21 DIAGNOSIS — L720 Epidermal cyst: Secondary | ICD-10-CM | POA: Diagnosis not present

## 2017-08-03 DIAGNOSIS — R05 Cough: Secondary | ICD-10-CM | POA: Diagnosis not present

## 2017-08-04 DIAGNOSIS — L720 Epidermal cyst: Secondary | ICD-10-CM | POA: Diagnosis not present

## 2017-08-09 ENCOUNTER — Observation Stay (HOSPITAL_COMMUNITY)
Admission: EM | Admit: 2017-08-09 | Discharge: 2017-08-11 | Disposition: A | Discharge status: Home / Self Care | Payer: 59 | Attending: Internal Medicine | Admitting: Internal Medicine

## 2017-08-09 ENCOUNTER — Emergency Department (HOSPITAL_COMMUNITY): Payer: 59

## 2017-08-09 ENCOUNTER — Inpatient Hospital Stay (HOSPITAL_BASED_OUTPATIENT_CLINIC_OR_DEPARTMENT_OTHER): Payer: 59

## 2017-08-09 ENCOUNTER — Other Ambulatory Visit: Payer: Self-pay

## 2017-08-09 ENCOUNTER — Encounter (HOSPITAL_COMMUNITY): Payer: Self-pay | Admitting: Emergency Medicine

## 2017-08-09 DIAGNOSIS — G4730 Sleep apnea, unspecified: Secondary | ICD-10-CM | POA: Insufficient documentation

## 2017-08-09 DIAGNOSIS — I5041 Acute combined systolic (congestive) and diastolic (congestive) heart failure: Secondary | ICD-10-CM | POA: Insufficient documentation

## 2017-08-09 DIAGNOSIS — I110 Hypertensive heart disease with heart failure: Secondary | ICD-10-CM | POA: Diagnosis not present

## 2017-08-09 DIAGNOSIS — I340 Nonrheumatic mitral (valve) insufficiency: Secondary | ICD-10-CM | POA: Diagnosis not present

## 2017-08-09 DIAGNOSIS — J45909 Unspecified asthma, uncomplicated: Secondary | ICD-10-CM | POA: Insufficient documentation

## 2017-08-09 DIAGNOSIS — R079 Chest pain, unspecified: Secondary | ICD-10-CM | POA: Diagnosis not present

## 2017-08-09 DIAGNOSIS — Z880 Allergy status to penicillin: Secondary | ICD-10-CM | POA: Diagnosis not present

## 2017-08-09 DIAGNOSIS — Z79899 Other long term (current) drug therapy: Secondary | ICD-10-CM | POA: Insufficient documentation

## 2017-08-09 DIAGNOSIS — I5020 Unspecified systolic (congestive) heart failure: Secondary | ICD-10-CM

## 2017-08-09 DIAGNOSIS — R262 Difficulty in walking, not elsewhere classified: Secondary | ICD-10-CM | POA: Insufficient documentation

## 2017-08-09 DIAGNOSIS — I517 Cardiomegaly: Secondary | ICD-10-CM | POA: Insufficient documentation

## 2017-08-09 DIAGNOSIS — I509 Heart failure, unspecified: Secondary | ICD-10-CM | POA: Diagnosis not present

## 2017-08-09 DIAGNOSIS — R0602 Shortness of breath: Secondary | ICD-10-CM | POA: Diagnosis not present

## 2017-08-09 LAB — CBC
HEMATOCRIT: 38.2 % (ref 36.0–46.0)
HEMOGLOBIN: 12.8 g/dL (ref 12.0–15.0)
MCH: 29.2 pg (ref 26.0–34.0)
MCHC: 33.5 g/dL (ref 30.0–36.0)
MCV: 87 fL (ref 78.0–100.0)
PLATELETS: 246 10*3/uL (ref 150–400)
RBC: 4.39 MIL/uL (ref 3.87–5.11)
RDW: 16 % — ABNORMAL HIGH (ref 11.5–15.5)
WBC: 7.1 10*3/uL (ref 4.0–10.5)

## 2017-08-09 LAB — ECHOCARDIOGRAM COMPLETE
Height: 63 in
WEIGHTICAEL: 4201.09 [oz_av]

## 2017-08-09 LAB — BASIC METABOLIC PANEL
ANION GAP: 9 (ref 5–15)
BUN: 22 mg/dL — AB (ref 6–20)
CHLORIDE: 106 mmol/L (ref 101–111)
CO2: 27 mmol/L (ref 22–32)
Calcium: 9.6 mg/dL (ref 8.9–10.3)
Creatinine, Ser: 0.61 mg/dL (ref 0.44–1.00)
GFR calc Af Amer: >60 mL/min (ref >60)
GFR calc non Af Amer: >60 mL/min (ref >60)
GLUCOSE: 107 mg/dL — AB (ref 65–99)
POTASSIUM: 3.4 mmol/L — AB (ref 3.5–5.1)
Sodium: 142 mmol/L (ref 135–145)

## 2017-08-09 LAB — POCT I-STAT TROPONIN I: Troponin i, poc: 0.01 ng/mL (ref 0.00–0.08)

## 2017-08-09 LAB — TROPONIN I: Troponin I: <0.03 ng/mL (ref <0.03)

## 2017-08-09 LAB — BRAIN NATRIURETIC PEPTIDE
B NATRIURETIC PEPTIDE 5: 464.2 pg/mL — AB (ref 0.0–100.0)
B NATRIURETIC PEPTIDE 5: 414.5 pg/mL — AB (ref 0.0–100.0)

## 2017-08-09 MED ORDER — FUROSEMIDE 10 MG/ML IJ SOLN: 40.0000 mg | Freq: Every day | INTRAMUSCULAR | Status: DC

## 2017-08-09 MED ORDER — ALBUTEROL SULFATE (2.5 MG/3ML) 0.083% IN NEBU: 3.0000 mL | INHALATION_SOLUTION | Freq: Four times a day (QID) | RESPIRATORY_TRACT | Status: DC | PRN

## 2017-08-09 MED ORDER — SODIUM CHLORIDE 0.9 % IV SOLN: 250.0000 mL | INTRAVENOUS | Status: DC | PRN

## 2017-08-09 MED ORDER — SODIUM CHLORIDE 0.9% FLUSH: 3.0000 mL | INTRAVENOUS | Status: DC | PRN

## 2017-08-09 MED ORDER — BUDESONIDE 0.5 MG/2ML IN SUSP
0.5000 mg | Freq: Two times a day (BID) | RESPIRATORY_TRACT | Status: DC
  Administered 2017-08-09 – 2017-08-11 (×5): 0.5 mg via RESPIRATORY_TRACT
  Filled 2017-08-09 (×5): qty 2

## 2017-08-09 MED ORDER — AZITHROMYCIN 250 MG PO TABS
250.0000 mg | ORAL_TABLET | Freq: Every day | ORAL | Status: AC
  Administered 2017-08-09 – 2017-08-10 (×2): 250 mg via ORAL
  Filled 2017-08-09 (×2): qty 1

## 2017-08-09 MED ORDER — ENOXAPARIN SODIUM 60 MG/0.6ML ~~LOC~~ SOLN
60.0000 mg | Freq: Every day | SUBCUTANEOUS | Status: DC
  Administered 2017-08-09 – 2017-08-11 (×3): 60 mg via SUBCUTANEOUS
  Filled 2017-08-09 (×3): qty 0.6

## 2017-08-09 MED ORDER — ZOLPIDEM TARTRATE 5 MG PO TABS
5.0000 mg | ORAL_TABLET | Freq: Every evening | ORAL | Status: DC | PRN
  Administered 2017-08-09: 5 mg via ORAL
  Filled 2017-08-09: qty 1

## 2017-08-09 MED ORDER — ONDANSETRON HCL 4 MG/2ML IJ SOLN: 4.0000 mg | Freq: Four times a day (QID) | INTRAMUSCULAR | Status: DC | PRN

## 2017-08-09 MED ORDER — FUROSEMIDE 10 MG/ML IJ SOLN
40.0000 mg | Freq: Once | INTRAMUSCULAR | Status: AC
  Administered 2017-08-09: 40 mg via INTRAVENOUS
  Filled 2017-08-09: qty 4

## 2017-08-09 MED ORDER — SODIUM CHLORIDE 0.9% FLUSH
3.0000 mL | Freq: Two times a day (BID) | INTRAVENOUS | Status: DC
  Administered 2017-08-09 – 2017-08-11 (×5): 3 mL via INTRAVENOUS

## 2017-08-09 MED ORDER — ACETAMINOPHEN 325 MG PO TABS
650.0000 mg | ORAL_TABLET | ORAL | Status: DC | PRN
  Administered 2017-08-09 (×2): 650 mg via ORAL
  Filled 2017-08-09 (×2): qty 2

## 2017-08-09 MED ORDER — NITROGLYCERIN 2 % TD OINT
0.5000 [in_us] | TOPICAL_OINTMENT | Freq: Four times a day (QID) | TRANSDERMAL | Status: DC
  Administered 2017-08-09 – 2017-08-10 (×5): 0.5 [in_us] via TOPICAL
  Filled 2017-08-09: qty 30
  Filled 2017-08-09: qty 1

## 2017-08-09 MED ORDER — LISINOPRIL 5 MG PO TABS
5.0000 mg | ORAL_TABLET | Freq: Every day | ORAL | Status: DC
  Administered 2017-08-09 – 2017-08-11 (×3): 5 mg via ORAL
  Filled 2017-08-09 (×3): qty 1

## 2017-08-09 NOTE — ED Triage Notes (Signed)
Patient complaining of sob and tightness of chest. Patient states that she is also having night sweats. Patient states the chest tightness started two days ago and the sob started two weeks ago. Primary care doctor has seen her and gave her an inhaler but patient states it is getting worse.

## 2017-08-09 NOTE — ED Provider Notes (Signed)
[facility] DEPT Provider Note   CSN: [id number] Arrival date & time: 08/09/17  0160     History   Chief Complaint Chief Complaint  Patient presents with  . Shortness of Breath  . Chest Pain    HPI [name] [name] is a 56 y.o. female.  The history is provided by the patient.  Shortness of Breath  This is a new problem. The average episode lasts 3 weeks. The problem occurs continuously.The current episode started more than 1 week ago. The problem has been gradually worsening. Associated symptoms include wheezing, orthopnea and chest pain. Pertinent negatives include no fever, no cough, no sputum production and no abdominal pain. It is unknown what precipitated the problem. She has tried beta-agonist inhalers for the symptoms. The treatment provided no relief. Associated medical issues do not include PE or heart failure.  Chest Pain   Associated symptoms include orthopnea and shortness of breath. Pertinent negatives include no abdominal pain, no cough, no fever and no sputum production.  Pertinent negatives for past medical history include no PE.    Past Medical History:  Diagnosis Date  . Asthma   . Hypertension   . Lumbar herniated disc   . Pre-diabetes   . Rheumatoid arthritis ([place])   . Sleep apnea    has not received CPAP yet     Patient Active Problem List   Diagnosis Date Noted  . Acute CHF (congestive heart failure) ([place]) 08/09/2017  . Prediabetes 09/29/2016  . 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 Obstructive sleep apnea 09/18/2016  . 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 Itching-Bilateral dorsum foot 07/11/2015  . Pain in joint, ankle and foot 07/11/2015  . Foot swelling 07/11/2015  . FUO (fever of unknown origin) 03/14/2012  . Hypokalemia 03/14/2012  . Hyponatremia 03/14/2012  . Dehydration 03/14/2012  . LBBB (left bundle branch block) 03/14/2012  . Obesity, morbid ([place]) 03/07/2008  . Essential hypertension  03/07/2008  . [place] DISEASE 03/07/2008  . ABSCESS, PERIRECTAL 03/07/2008    Past Surgical History:  Procedure Laterality Date  . BREAST SURGERY     breast reduction   . LAPAROSCOPIC GASTRIC SLEEVE RESECTION N/A 09/29/2016   Procedure: LAPAROSCOPIC GASTRIC SLEEVE RESECTION, UPPER ENDO;  Surgeon: Greer Pickerel, MD;  Location: [facility] ORS;  Service: General;  Laterality: N/A;  . REDUCTION MAMMAPLASTY Bilateral 2003  . removed bone from left knee     . UPPER GI ENDOSCOPY  09/29/2016   Procedure: UPPER GI ENDOSCOPY;  Surgeon: Greer Pickerel, MD;  Location: [facility]S;  Service: General;;    OB History    No data available       Home Medications    Prior to Admission medications   Medication Sig Start Date End Date Taking? Authorizing Provider  albuterol (PROVENTIL HFA;VENTOLIN HFA) 108 (90 Base) MCG/ACT inhaler Inhale into the lungs every 6 (six) hours as needed for wheezing or shortness of breath.   Yes [provider]  azithromycin (ZITHROMAX) 250 MG tablet Take 250 mg by mouth daily. 08/04/17  Yes [provider]  beclomethasone (QVAR) 80 MCG/ACT inhaler Inhale 2 puffs into the lungs 2 (two) times daily as needed (wheezing).   Yes [provider]  Calcium 500-100 MG-UNIT CHEW Chew 1 tablet by mouth 3 (three) times daily.   Yes [provider]  fluticasone (FLONASE) 50 MCG/ACT nasal spray INHALE 1 SPRAY INTO EACH NOSTRIL ONCE DAILY 05/11/15  Yes [provider]  Multiple Vitamin (MULTIVITAMIN WITH MINERALS) TABS tablet Take 1  tablet by mouth 2 (two) times daily.   Yes [provider]  valACYclovir (VALTREX) 500 MG tablet TAKE 1 TABLET BY MOUTH EVERY DAY Patient taking differently: TAKE 1 TABLET BY MOUTH EVERY DAY AS NEEDED FOR FLARE UP 06/14/15  Yes Le, Thao P, DO  HYDROcodone-acetaminophen (NORCO/VICODIN) 5-325 MG tablet Take 2 tablets by mouth every 4 (four) hours as needed. Patient not taking: Reported on 08/09/2017 01/10/17   Barry Dienes, NP    oxyCODONE (ROXICODONE) 5 MG/5ML solution Take 5-10 mLs (5-10 mg total) by mouth every 4 (four) hours as needed for moderate pain or severe pain. Patient not taking: Reported on 08/09/2017 09/30/16   Greer Pickerel, MD    Family History Family History  Problem Relation Age of Onset  . Ovarian cancer Mother 57       Not sure of this diagnosis  . Diabetes Brother   . Hypertension Brother   . Breast cancer Neg Hx     Social History Social History  Substance Use Topics  . Smoking status: Never Smoker  . Smokeless tobacco: Never Used  . Alcohol use No     Allergies   Penicillins   Review of Systems Review of Systems  Constitutional: Negative for fever.  Respiratory: Positive for shortness of breath and wheezing. Negative for cough and sputum production.   Cardiovascular: Positive for chest pain and orthopnea.  Gastrointestinal: Negative for abdominal pain.  All other systems reviewed and are negative.    Physical Exam Updated Vital Signs BP (!) 170/118   Pulse 94   Temp 97.7 F (36.5 C) (Oral)   Resp 19   Ht 5\' 3"  (1.6 m)   Wt 117.9 kg (260 lb)   SpO2 93%   BMI 46.06 kg/m   Physical Exam  Constitutional: She is oriented to person, place, and time. She appears well-developed and well-nourished. No distress.  HENT:  Head: Normocephalic and atraumatic.  Nose: Nose normal.  Mouth/Throat: No oropharyngeal exudate.  Eyes: Pupils are equal, round, and reactive to light. Conjunctivae are normal.  Neck: Normal range of motion. Neck supple. No JVD present.  Cardiovascular: Normal rate, regular rhythm, normal heart sounds and intact distal pulses.   Pulmonary/Chest: No stridor. She has rales.  Abdominal: Soft. Bowel sounds are normal. She exhibits no mass. There is no tenderness. There is no rebound and no guarding.  Musculoskeletal: Normal range of motion.  Ankle edema  Neurological: She is alert and oriented to person, place, and time.  Skin: Skin is warm and dry.  Capillary refill takes less than 2 seconds.  Psychiatric: She has a normal mood and affect.     ED Treatments / Results   Vitals:   08/09/17 0530 08/09/17 0543  BP:  (!) 170/118  Pulse: 95 94  Resp: 20 19  Temp:    SpO2: 90% 93%    Labs (all labs ordered are listed, but only abnormal results are displayed)  Results for orders placed or performed during the hospital encounter of 62/70/35  Basic metabolic panel  Result Value Ref Range   Sodium 142 135 - 145 mmol/L   Potassium 3.4 (L) 3.5 - 5.1 mmol/L   Chloride 106 101 - 111 mmol/L   CO2 27 22 - 32 mmol/L   Glucose, Bld 107 (H) 65 - 99 mg/dL   BUN 22 (H) 6 - 20 mg/dL   Creatinine, Ser 0.61 0.44 - 1.00 mg/dL   Calcium 9.6 8.9 - 10.3 mg/dL   GFR calc non Af  Amer >60 >60 mL/min   GFR calc Af Amer >60 >60 mL/min   Anion gap 9 5 - 15  CBC  Result Value Ref Range   WBC 7.1 4.0 - 10.5 K/uL   RBC 4.39 3.87 - 5.11 MIL/uL   Hemoglobin 12.8 12.0 - 15.0 g/dL   HCT 38.2 36.0 - 46.0 %   MCV 87.0 78.0 - 100.0 fL   MCH 29.2 26.0 - 34.0 pg   MCHC 33.5 30.0 - 36.0 g/dL   RDW 16.0 (H) 11.5 - 15.5 %   Platelets 246 150 - 400 K/uL  POCT i-Stat troponin I  Result Value Ref Range   Troponin i, poc 0.01 0.00 - 0.08 ng/mL   Comment 3           Dg Chest 2 View  Result Date: 08/09/2017 CLINICAL DATA:  Chest pain and shortness of breath. EXAM: CHEST  2 VIEW COMPARISON:  Radiographs 07/02/2016 FINDINGS: Mild cardiomegaly. There is vascular congestion. Perihilar bronchial thickening suggesting pulmonary edema. Fluid in the fissures without subpulmonic effusion. No focal airspace disease. No pneumothorax. No acute osseous abnormality. IMPRESSION: Mild cardiomegaly with vascular congestion and probable perihilar edema. Findings suggest mild fluid overload/CHF. Electronically Signed   By: Jeb Levering M.D.   On: 08/09/2017 03:13    EKG  EKG Interpretation  Date/Time:  Sunday August 09 2017 03:01:00 EDT Ventricular Rate:  99 PR  Interval:    QRS Duration: 128 QT Interval:  386 QTC Calculation: 496 R Axis:   58 Text Interpretation:  Sinus rhythm IVCD, consider atypical LBBB Confirmed by Randal Buba, [place] Freyre ([id number]) on 08/09/2017 4:50:05 AM       Radiology Dg Chest 2 View  Result Date: 08/09/2017 CLINICAL DATA:  Chest pain and shortness of breath. EXAM: CHEST  2 VIEW COMPARISON:  Radiographs 07/02/2016 FINDINGS: Mild cardiomegaly. There is vascular congestion. Perihilar bronchial thickening suggesting pulmonary edema. Fluid in the fissures without subpulmonic effusion. No focal airspace disease. No pneumothorax. No acute osseous abnormality. IMPRESSION: Mild cardiomegaly with vascular congestion and probable perihilar edema. Findings suggest mild fluid overload/CHF. Electronically Signed   By: Jeb Levering M.D.   On: 08/09/2017 03:13    Procedures Procedures (including critical care time)  Medications Ordered in ED Medications  nitroGLYCERIN (NITROGLYN) 2 % ointment 0.5 inch (not administered)  furosemide (LASIX) injection 40 mg (40 mg Intravenous Given 08/09/17 0531)      Final Clinical Impressions(s) / ED Diagnoses  New onset CHF will admit to medicine New Prescriptions New Prescriptions   No medications on file     Daundre Biel, MD 08/09/17 [phone]

## 2017-08-09 NOTE — H&P (Signed)
History and Physical  [name] [name] ZOX:[id number] DOB: 20-Mar-1961 DOA: 08/09/2017  PCP:  [name] Small, MD   Chief Complaint:  Dyspnea   History of Present Illness:  Pt is a 56 yo female with hx of asthma who came with cc of dyspnea for the past 3 weeks associated with LE edema, orthopnea, PND exacerbated by exertion w/o wheezing or significant cough/sputum. No other complaints. No hx of MI or HF.   Review of Systems:  CONSTITUTIONAL:     No night sweats.  No fatigue.  No fever. No chills. Eyes:                            No visual changes.  No eye pain.  No eye discharge.   ENT:                              No epistaxis.  No sinus pain.  No sore throat.   No congestion. RESPIRATORY:           No cough.  No wheeze.  No hemoptysis.  +dyspnea CARDIOVASCULAR   :  No chest pains.  No palpitations. GASTROINTESTINAL:  No abdominal pain.  No nausea. No vomiting.  No diarrhea. No    constipation.  No hematemesis.  No hematochezia.  No melena. GENITOURINARY:      No urgency.  No frequency.  No dysuria.  No hematuria.  No    obstructive symptoms.  No discharge.  No pain.  MUSCULOSKELETAL:  No musculoskeletal pain.  No joint swelling.  No arthritis. NEUROLOGICAL:        No confusion.  No weakness. No headache. No seizure. PSYCHIATRIC:             No depression. No anxiety. No suicidal ideation. SKIN:                             No rashes.  No lesions.  No wounds. ENDOCRINE:                No weight loss.  No polydipsia.  No polyuria.  No polyphagia. HEMATOLOGIC:           No purpura.  No petechiae.  No bleeding.  ALLERGIC                 : No pruritus.  No angioedema Other:  Past Medical and Surgical History:   Past Medical History:  Diagnosis Date  . Asthma   . Hypertension   . Lumbar herniated disc   . Pre-diabetes   . Rheumatoid arthritis ([name] [name])   . Sleep apnea    has not received CPAP yet    Past Surgical History:  Procedure Laterality Date  . BREAST SURGERY     breast reduction   . LAPAROSCOPIC GASTRIC SLEEVE RESECTION N/A 09/29/2016   Procedure: LAPAROSCOPIC GASTRIC SLEEVE RESECTION, UPPER ENDO;  Surgeon: Greer Pickerel, MD;  Location: [facility];  Service: General;  Laterality: N/A;  . REDUCTION MAMMAPLASTY Bilateral 2003  . removed bone from left knee     . UPPER GI ENDOSCOPY  09/29/2016   Procedure: UPPER GI ENDOSCOPY;  Surgeon: Greer Pickerel, MD;  Location: [facility];  Service: General;;    Social History:   reports that she has never smoked. She has never used smokeless tobacco. She reports that she does not drink alcohol  or use drugs.    Allergies  Allergen Reactions  . Penicillins Anaphylaxis, Hives and Shortness Of Breath    Has patient had a PCN reaction causing immediate rash, facial/tongue/throat swelling, SOB or lightheadedness with hypotension: yes Has patient had a PCN reaction causing severe rash involving mucus membranes or skin necrosis: yes Has patient had a PCN reaction that required hospitalization no Has patient had a PCN reaction occurring within the last 10 years: yes If all of the above answers are "NO", then may proceed with Cephalosporin use.    Family History  Problem Relation Age of Onset  . Ovarian cancer Mother 11       Not sure of this diagnosis  . Diabetes Brother   . Hypertension Brother   . Breast cancer Neg Hx       Prior to Admission medications   Medication Sig Start Date End Date Taking? Authorizing Provider  albuterol (PROVENTIL HFA;VENTOLIN HFA) 108 (90 Base) MCG/ACT inhaler Inhale into the lungs every 6 (six) hours as needed for wheezing or shortness of breath.   Yes [provider]  azithromycin (ZITHROMAX) 250 MG tablet Take 250 mg by mouth daily. 08/04/17  Yes [provider]  beclomethasone (QVAR) 80 MCG/ACT inhaler Inhale 2 puffs into the lungs 2 (two) times daily as needed (wheezing).   Yes [provider]  Calcium 500-100 MG-UNIT CHEW Chew 1 tablet by mouth 3 (three) times  daily.   Yes [provider]  fluticasone (FLONASE) 50 MCG/ACT nasal spray INHALE 1 SPRAY INTO EACH NOSTRIL ONCE DAILY 05/11/15  Yes [provider]  Multiple Vitamin (MULTIVITAMIN WITH MINERALS) TABS tablet Take 1 tablet by mouth 2 (two) times daily.   Yes [provider]  valACYclovir (VALTREX) 500 MG tablet TAKE 1 TABLET BY MOUTH EVERY DAY Patient taking differently: TAKE 1 TABLET BY MOUTH EVERY DAY AS NEEDED FOR FLARE UP 06/14/15  Yes Le, Thao P, DO  HYDROcodone-acetaminophen (NORCO/VICODIN) 5-325 MG tablet Take 2 tablets by mouth every 4 (four) hours as needed. Patient not taking: Reported on 08/09/2017 01/10/17   Barry Dienes, NP  oxyCODONE (ROXICODONE) 5 MG/5ML solution Take 5-10 mLs (5-10 mg total) by mouth every 4 (four) hours as needed for moderate pain or severe pain. Patient not taking: Reported on 08/09/2017 09/30/16   Greer Pickerel, MD    Physical Exam: BP (!) 154/115 (BP Location: Right Arm)   Pulse 94   Temp (!) 97.5 F (36.4 C) (Oral)   Resp 18   Ht 5\' 3"  (1.6 m)   Wt 119.1 kg (262 lb 9.1 oz)   SpO2 100%   BMI 46.51 kg/m   GENERAL :   Alert and cooperative, and appears to be in no acute distress. HEAD:           normocephalic. EYES:            PERRL, EOMI.  vision is grossly intact. EARS:           hearing grossly intact. NECK:          supple CARDIAC:    Normal S1 and S2. No gallop. No murmurs.  Vascular:     no peripheral edema.  LUNGS:       Clear to auscultation  ABDOMEN: Positive bowel sounds. Soft, nondistended, nontender. No guarding or rebound.      MSK:           No joint erythema or tenderness. Normal muscular development. EXT           :  No significant deformity or joint abnormality. Neuro        : Alert, oriented to person, place, and time.                      CN II-XII intact.  SKIN:            No rash. No lesions. PSYCH:       No hallucination. Patient is not suicidal.          Labs on Admission:  Reviewed.   Radiological  Exams on Admission: Dg Chest 2 View  Result Date: 08/09/2017 CLINICAL DATA:  Chest pain and shortness of breath. EXAM: CHEST  2 VIEW COMPARISON:  Radiographs 07/02/2016 FINDINGS: Mild cardiomegaly. There is vascular congestion. Perihilar bronchial thickening suggesting pulmonary edema. Fluid in the fissures without subpulmonic effusion. No focal airspace disease. No pneumothorax. No acute osseous abnormality. IMPRESSION: Mild cardiomegaly with vascular congestion and probable perihilar edema. Findings suggest mild fluid overload/CHF. Electronically Signed   By: Jeb Levering M.D.   On: 08/09/2017 03:13    EKG:  Independently reviewed. NSR suggest LVH ?  Assessment/Plan  Acute c hear failure: No hx of hypertensive disease per patient Will r/o ACS with serial trops EKG suggest LBBB/LVH Will check Echo Last Echo from 2017 with normal EF/diastolic function  Keep on tele Diuretics: lasix 40 mg IV daily BB: hold due to acute decompensation  ACEI/ARBS:started on 5 mg lisinopril daily  Monitor I/O & daily weights Fluid restriction Heart healthy diet Cardio consult   Asthma: not in exacerbation. Continue home medications.   Input & Output:ordered   Lines & Tubes: PIV DVT prophylaxis: [place] enoxaparin  GI prophylaxis: NA Consultants: Cardio  Code Status: full  Family Communication: at bedside  Disposition Plan: TBD    Gennaro Africa M.D [facility]

## 2017-08-09 NOTE — ED Notes (Signed)
Please call Anallely Rosell for report at [place]. Phone # [phone]

## 2017-08-09 NOTE — Progress Notes (Signed)
  Echocardiogram 2D Echocardiogram has been performed.  Mell Guia T Boluwatife Mutchler 08/09/2017, 11:54 AM

## 2017-08-10 DIAGNOSIS — I5041 Acute combined systolic (congestive) and diastolic (congestive) heart failure: Secondary | ICD-10-CM

## 2017-08-10 LAB — BASIC METABOLIC PANEL
Anion gap: 11 (ref 5–15)
BUN: 18 mg/dL (ref 6–20)
CHLORIDE: 101 mmol/L (ref 101–111)
CO2: 28 mmol/L (ref 22–32)
Calcium: 9.6 mg/dL (ref 8.9–10.3)
Creatinine, Ser: 0.46 mg/dL (ref 0.44–1.00)
Glucose, Bld: 96 mg/dL (ref 65–99)
POTASSIUM: 3.3 mmol/L — AB (ref 3.5–5.1)
SODIUM: 140 mmol/L (ref 135–145)

## 2017-08-10 LAB — HIV ANTIBODY (ROUTINE TESTING W REFLEX): HIV SCREEN 4TH GENERATION: NONREACTIVE

## 2017-08-10 MED ORDER — CARVEDILOL 3.125 MG PO TABS
3.1250 mg | ORAL_TABLET | Freq: Two times a day (BID) | ORAL | Status: DC
  Administered 2017-08-10 – 2017-08-11 (×2): 3.125 mg via ORAL
  Filled 2017-08-10 (×2): qty 1

## 2017-08-10 MED ORDER — LIVING BETTER WITH HEART FAILURE BOOK
Freq: Once | Status: AC
  Administered 2017-08-10: 10:00:00

## 2017-08-10 MED ORDER — POTASSIUM CHLORIDE CRYS ER 20 MEQ PO TBCR
40.0000 meq | EXTENDED_RELEASE_TABLET | ORAL | Status: AC
  Administered 2017-08-10 (×2): 40 meq via ORAL
  Filled 2017-08-10 (×2): qty 2

## 2017-08-10 MED ORDER — CLOTRIMAZOLE 1 % VA CREA
1.0000 | TOPICAL_CREAM | Freq: Every day | VAGINAL | Status: DC
  Administered 2017-08-10: 1 via VAGINAL
  Filled 2017-08-10: qty 45

## 2017-08-10 MED ORDER — FUROSEMIDE 40 MG PO TABS
40.0000 mg | ORAL_TABLET | Freq: Every day | ORAL | Status: DC
  Administered 2017-08-10 – 2017-08-11 (×2): 40 mg via ORAL
  Filled 2017-08-10 (×2): qty 1

## 2017-08-10 NOTE — Evaluation (Signed)
Physical Therapy One Time Evaluation Patient Details Name: [name] [name] MRN: [id number] DOB: 1961-02-16 Today's Date: 08/10/2017   History of Present Illness   56 yo female with hx of asthma, RA, HTN and admitted for acute CHF  Clinical Impression  Patient evaluated by Physical Therapy with no further acute PT needs identified. All education has been completed and the patient has no further questions.  Pt mobilizing well and reports possible d/c home tomorrow. No follow-up Physical Therapy or equipment needs identified at this time. PT is signing off. Thank you for this referral.     Follow Up Recommendations No PT follow up    Equipment Recommendations  None recommended by PT    Recommendations for Other Services       Precautions / Restrictions Precautions Precautions: None      Mobility  Bed Mobility Overal bed mobility: Independent                Transfers Overall transfer level: Independent                  Ambulation/Gait Ambulation/Gait assistance: Modified independent (Device/Increase time);Supervision Ambulation Distance (Feet): 400 Feet Assistive device: None Gait Pattern/deviations: WFL(Within Functional Limits)     General Gait Details: HR up to 130 however no other deficits, symptoms observed  Stairs            Wheelchair Mobility    Modified Rankin (Stroke Patients Only)       Balance Overall balance assessment: No apparent balance deficits (not formally assessed) (denies falls)                                           Pertinent Vitals/Pain Pain Assessment: No/denies pain    Home Living Family/patient expects to be discharged to:: Private residence Living Arrangements: Spouse/significant other   Type of Home: House Home Access: Stairs to enter   Technical brewer of Steps: 3-4 Home Layout: One level Home Equipment: None      Prior Function Level of Independence: Independent                Hand Dominance        Extremity/Trunk Assessment   Upper Extremity Assessment Upper Extremity Assessment: Overall WFL for tasks assessed    Lower Extremity Assessment Lower Extremity Assessment: Overall WFL for tasks assessed       Communication   Communication: No difficulties  Cognition Arousal/Alertness: Awake/alert Behavior During Therapy: WFL for tasks assessed/performed Overall Cognitive Status: Within Functional Limits for tasks assessed                                        General Comments      Exercises     Assessment/Plan    PT Assessment Patent does not need any further PT services  PT Problem List         PT Treatment Interventions      PT Goals (Current goals can be found in the Care Plan section)  Acute Rehab PT Goals PT Goal Formulation: All assessment and education complete, DC therapy    Frequency     Barriers to discharge        Co-evaluation               AM-PAC PT "6  Clicks" Daily Activity  Outcome Measure Difficulty turning over in bed (including adjusting bedclothes, sheets and blankets)?: None Difficulty moving from lying on back to sitting on the side of the bed? : None Difficulty sitting down on and standing up from a chair with arms (e.g., wheelchair, bedside commode, etc,.)?: None Help needed moving to and from a bed to chair (including a wheelchair)?: None Help needed walking in hospital room?: None Help needed climbing 3-5 steps with a railing? : None 6 Click Score: 24    End of Session   Activity Tolerance: Patient tolerated treatment well Patient left: in bed;with call bell/phone within reach   PT Visit Diagnosis: Difficulty in walking, not elsewhere classified (R26.2)    Time: 1355-1403 PT Time Calculation (min) (ACUTE ONLY): 8 min   Charges:   PT Evaluation $PT Eval Low Complexity: 1 Low     PT G CodesCarmelia Bake, PT, DPT 08/10/2017 Pager:  [phone]  York Ram E 08/10/2017, 3:21 PM

## 2017-08-10 NOTE — Progress Notes (Signed)
PROGRESS NOTE    [name] [name]  SHF:[id number] DOB: [birth year] DOA: 08/09/2017 PCP: Maurice Small, MD     Brief Narrative:  [name] [name] is a 56 yo female with hx of asthma who presented to the hospital for 3 weeks of shortness of breath, lower extremity edema. Patient was admitted for acute systolic heart failure, echocardiogram completed with reduced EF 40% with restrictive physiology. Patient was started on IV Lasix and admitted to the hospital for further treatment course.  Assessment & Plan:   Principal Problem:   Acute CHF (congestive heart failure) (HCC)   Acute systolic and diastolic heart failure  -CXR: Mild cardiomegaly with vascular congestion and probable perihilar edema. Findings suggest mild fluid overload/CHF. -Echo: EF 40% with restrictive physiology -Started on Lasix 40 mg IV daily with good diuresis, changed to by mouth Lasix -Start coreg and lisinopril -Strict I/Os, daily weight  -Discussed low sodium diet, heart healthy diet   Hypokalemia -Replace, trend  Sleep apnea -Was recommended to use CPAP after sleep study, never picked up her machine -CPAP daily at bedtime  Asthma -Not in acute exacerbation   DVT prophylaxis: lovenox Code Status: full Family Communication: no family at bedside  Disposition Plan: Pending improvement, home 9/18   Consultants:   None    Procedures:   None   Antimicrobials:  Anti-infectives    Start     Dose/Rate Route Frequency Ordered Stop   08/09/17 1100  azithromycin (ZITHROMAX) tablet 250 mg     250 mg Oral Daily 08/09/17 0941 08/10/17 1150        Subjective: Feeling much better today. Denies any shortness of breath, states that this has now resolved. Denies any lower extremity swelling today. Overall, feeling well. Denies any chest pain, nausea or vomiting.  Objective: Vitals:   08/09/17 2012 08/09/17 2041 08/10/17 0500 08/10/17 0515  BP:  (!) 153/91  (!) 141/91  Pulse:  80  95  Resp:  16   16  Temp:  97.8 F (36.6 C)  98.6 F (37 C)  TempSrc:  Oral  Oral  SpO2: 98% 97%  99%  Weight:   116.6 kg (257 lb 0.9 oz)   Height:        Intake/Output Summary (Last 24 hours) at 08/10/17 1238 Last data filed at 08/10/17 0600  Gross per 24 hour  Intake                0 ml  Output              100 ml  Net             -100 ml   Filed Weights   08/09/17 0253 08/09/17 0649 08/10/17 0500  Weight: 117.9 kg (260 lb) 119.1 kg (262 lb 9.1 oz) 116.6 kg (257 lb 0.9 oz)    Examination:  General exam: Appears calm and comfortable  Respiratory system: Clear to auscultation. Respiratory effort normal. Cardiovascular system: S1 & S2 heard, RRR. No JVD, murmurs, rubs, gallops or clicks. No pedal edema. Gastrointestinal system: Abdomen is nondistended, soft and nontender. No organomegaly or masses felt. Normal bowel sounds heard. Central nervous system: Alert and oriented. No focal neurological deficits. Extremities: Symmetric 5 x 5 power. Skin: No rashes, lesions or ulcers Psychiatry: Judgement and insight appear normal. Mood & affect appropriate.   Data Reviewed: I have personally reviewed following labs and imaging studies  CBC:  Recent Labs Lab 08/09/17 0423  WBC 7.1  HGB 12.8  HCT 38.2  MCV  87.0  PLT 144   Basic Metabolic Panel:  Recent Labs Lab 08/09/17 0423 08/10/17 0510  NA 142 140  K 3.4* 3.3*  CL 106 101  CO2 27 28  GLUCOSE 107* 96  BUN 22* 18  CREATININE 0.61 0.46  CALCIUM 9.6 9.6   GFR: Estimated Creatinine Clearance: 94.5 mL/min (by C-G formula based on SCr of 0.46 mg/dL). Liver Function Tests: No results for input(s): AST, ALT, ALKPHOS, BILITOT, PROT, ALBUMIN in the last 168 hours. No results for input(s): LIPASE, AMYLASE in the last 168 hours. No results for input(s): AMMONIA in the last 168 hours. Coagulation Profile: No results for input(s): INR, PROTIME in the last 168 hours. Cardiac Enzymes:  Recent Labs Lab 08/09/17 0954  TROPONINI <0.03    BNP (last 3 results) No results for input(s): PROBNP in the last 8760 hours. HbA1C: No results for input(s): HGBA1C in the last 72 hours. CBG: No results for input(s): GLUCAP in the last 168 hours. Lipid Profile: No results for input(s): CHOL, HDL, LDLCALC, TRIG, CHOLHDL, LDLDIRECT in the last 72 hours. Thyroid Function Tests: No results for input(s): TSH, T4TOTAL, FREET4, T3FREE, THYROIDAB in the last 72 hours. Anemia Panel: No results for input(s): VITAMINB12, FOLATE, FERRITIN, TIBC, IRON, RETICCTPCT in the last 72 hours. Sepsis Labs: No results for input(s): PROCALCITON, LATICACIDVEN in the last 168 hours.  No results found for this or any previous visit (from the past 240 hour(s)).     Radiology Studies: Dg Chest 2 View  Result Date: 08/09/2017 CLINICAL DATA:  Chest pain and shortness of breath. EXAM: CHEST  2 VIEW COMPARISON:  Radiographs 07/02/2016 FINDINGS: Mild cardiomegaly. There is vascular congestion. Perihilar bronchial thickening suggesting pulmonary edema. Fluid in the fissures without subpulmonic effusion. No focal airspace disease. No pneumothorax. No acute osseous abnormality. IMPRESSION: Mild cardiomegaly with vascular congestion and probable perihilar edema. Findings suggest mild fluid overload/CHF. Electronically Signed   By: Jeb Levering M.D.   On: 08/09/2017 03:13      Scheduled Meds: . budesonide  0.5 mg Nebulization BID  . enoxaparin (LOVENOX) injection  60 mg Subcutaneous Daily  . furosemide  40 mg Oral Daily  . lisinopril  5 mg Oral Daily  . Living Better with Heart Failure Book   Does not apply Once  . potassium chloride  40 mEq Oral Q4H  . sodium chloride flush  3 mL Intravenous Q12H   Continuous Infusions: . sodium chloride       LOS: 1 day    Time spent: 40 minutes   Dessa Phi, DO [facility] [email or web address] Password [facility] 08/10/2017, 12:38 PM

## 2017-08-10 NOTE — Progress Notes (Signed)
RT placed patient on CPAP. Patient setting is Auto 6.5 to 20 for patient comfort. Sterile water added to water chamber for humidification. Patient is tolerating well.

## 2017-08-11 DIAGNOSIS — I5041 Acute combined systolic (congestive) and diastolic (congestive) heart failure: Secondary | ICD-10-CM | POA: Diagnosis not present

## 2017-08-11 LAB — BASIC METABOLIC PANEL
Anion gap: 9 (ref 5–15)
BUN: 21 mg/dL — AB (ref 6–20)
CHLORIDE: 104 mmol/L (ref 101–111)
CO2: 27 mmol/L (ref 22–32)
Calcium: 9.4 mg/dL (ref 8.9–10.3)
Creatinine, Ser: 0.57 mg/dL (ref 0.44–1.00)
GFR calc Af Amer: >60 mL/min (ref >60)
GFR calc non Af Amer: >60 mL/min (ref >60)
GLUCOSE: 94 mg/dL (ref 65–99)
POTASSIUM: 4.1 mmol/L (ref 3.5–5.1)
Sodium: 140 mmol/L (ref 135–145)

## 2017-08-11 LAB — MAGNESIUM: MAGNESIUM: 1.5 mg/dL — AB (ref 1.7–2.4)

## 2017-08-11 MED ORDER — LISINOPRIL 5 MG PO TABS: 5.0000 mg | ORAL_TABLET | Freq: Every day | ORAL | 0 refills | Status: DC

## 2017-08-11 MED ORDER — FUROSEMIDE 40 MG PO TABS: 40.0000 mg | ORAL_TABLET | Freq: Every day | ORAL | 0 refills | Status: DC

## 2017-08-11 MED ORDER — MAGNESIUM SULFATE 2 GM/50ML IV SOLN
2.0000 g | Freq: Once | INTRAVENOUS | Status: AC
  Administered 2017-08-11: 2 g via INTRAVENOUS
  Filled 2017-08-11: qty 50

## 2017-08-11 MED ORDER — CARVEDILOL 3.125 MG PO TABS
3.1250 mg | ORAL_TABLET | Freq: Two times a day (BID) | ORAL | 0 refills | Status: DC
Start: 2017-08-11 — End: 2017-09-02

## 2017-08-11 NOTE — Discharge Summary (Signed)
Physician Discharge Summary  [name] [name] ZOX:[id number] DOB: [birth year] DOA: 08/09/2017  PCP: Maurice Small, MD  Admit date: 08/09/2017 Discharge date: 08/11/2017  Admitted From: Home Disposition:  Home  Recommendations for Outpatient Follow-up:  1. Follow up with PCP in 1 week 2. Follow up with sleep medicine for CPAP  3. Please obtain BMP/Mg in 1 week   Discharge Condition: Stable CODE STATUS: Full  Diet recommendation: Heart healthy   Brief/Interim Summary: [name] [name] is a 56 yo female with hx of asthma who presented to the hospital for 3 weeks of shortness of breath, lower extremity edema. Patient was admitted for acute systolic heart failure, echocardiogram completed with reduced EF 40% with restrictive physiology. Patient was started on IV Lasix and admitted to the hospital for further treatment course.  Discharge Diagnoses:   Acute systolic and diastolic heart failure  -CXR: Mild cardiomegaly with vascular congestion and probable perihilar edema. Findings suggest mild fluid overload/CHF. -Echo: EF 40% with restrictive physiology -Started on Lasix 40 mg IV daily with good diuresis, changed to Lasix 40mg  PO daily  -Start coreg and lisinopril -Strict I/Os, daily weight  -Discussed low sodium diet, heart healthy diet   Hypomagnesemia  -Replace, trend  Sleep apnea -Was recommended to use CPAP after sleep study, never picked up her machine -CPAP daily at bedtime. Follow up with outpatient physician.   Asthma -Not in acute exacerbation   Discharge Instructions  Discharge Instructions    (HEART FAILURE PATIENTS) Call MD:  Anytime you have any of the following symptoms: 1) 3 pound weight gain in 24 hours or 5 pounds in 1 week 2) shortness of breath, with or without a dry hacking cough 3) swelling in the hands, feet or stomach 4) if you have to sleep on extra pillows at night in order to breathe.    Complete by:  As directed    Call MD for:  difficulty  breathing, headache or visual disturbances    Complete by:  As directed    Call MD for:  extreme fatigue    Complete by:  As directed    Call MD for:  hives    Complete by:  As directed    Call MD for:  persistant dizziness or light-headedness    Complete by:  As directed    Call MD for:  persistant nausea and vomiting    Complete by:  As directed    Call MD for:  severe uncontrolled pain    Complete by:  As directed    Call MD for:  temperature >100.4    Complete by:  As directed    Diet - low sodium heart healthy    Complete by:  As directed    Discharge instructions    Complete by:  As directed    You were cared for by a hospitalist during your hospital stay. If you have any questions about your discharge medications or the care you received while you were in the hospital after you are discharged, you can call the unit and asked to speak with the hospitalist on call if the hospitalist that took care of you is not available. Once you are discharged, your primary care physician will handle any further medical issues. Please note that NO REFILLS for any discharge medications will be authorized once you are discharged, as it is imperative that you return to your primary care physician (or establish a relationship with a primary care physician if you do not have one) for your aftercare  needs so that they can reassess your need for medications and monitor your lab values.   Increase activity slowly    Complete by:  As directed      Allergies as of 08/11/2017      Reactions   Penicillins Anaphylaxis, Hives, Shortness Of Breath   Has patient had a PCN reaction causing immediate rash, facial/tongue/throat swelling, SOB or lightheadedness with hypotension: yes Has patient had a PCN reaction causing severe rash involving mucus membranes or skin necrosis: yes Has patient had a PCN reaction that required hospitalization no Has patient had a PCN reaction occurring within the last 10 years: yes If  all of the above answers are "NO", then may proceed with Cephalosporin use.      Medication List    STOP taking these medications   azithromycin 250 MG tablet Commonly known as:  ZITHROMAX   HYDROcodone-acetaminophen 5-325 MG tablet Commonly known as:  NORCO/VICODIN   oxyCODONE 5 MG/5ML solution Commonly known as:  ROXICODONE     TAKE these medications   albuterol 108 (90 Base) MCG/ACT inhaler Commonly known as:  PROVENTIL HFA;VENTOLIN HFA Inhale into the lungs every 6 (six) hours as needed for wheezing or shortness of breath.   beclomethasone 80 MCG/ACT inhaler Commonly known as:  QVAR Inhale 2 puffs into the lungs 2 (two) times daily as needed (wheezing).   Calcium 500-100 MG-UNIT Chew Chew 1 tablet by mouth 3 (three) times daily.   carvedilol 3.125 MG tablet Commonly known as:  COREG Take 1 tablet (3.125 mg total) by mouth 2 (two) times daily with a meal.   fluticasone 50 MCG/ACT nasal spray Commonly known as:  FLONASE INHALE 1 SPRAY INTO EACH NOSTRIL ONCE DAILY   furosemide 40 MG tablet Commonly known as:  LASIX Take 1 tablet (40 mg total) by mouth daily.   lisinopril 5 MG tablet Commonly known as:  PRINIVIL,ZESTRIL Take 1 tablet (5 mg total) by mouth daily.   multivitamin with minerals Tabs tablet Take 1 tablet by mouth 2 (two) times daily.   valACYclovir 500 MG tablet Commonly known as:  VALTREX TAKE 1 TABLET BY MOUTH EVERY DAY What changed:  See the new instructions.            Discharge Care Instructions        Start     Ordered   08/11/17 0000  carvedilol (COREG) 3.125 MG tablet  2 times daily with meals     08/11/17 1117   08/11/17 0000  furosemide (LASIX) 40 MG tablet  Daily     08/11/17 1117   08/11/17 0000  lisinopril (PRINIVIL,ZESTRIL) 5 MG tablet  Daily     08/11/17 1117   08/11/17 0000  Increase activity slowly     08/11/17 1117   08/11/17 0000  Diet - low sodium heart healthy     08/11/17 1117   08/11/17 0000  Discharge  instructions    Comments:  You were cared for by a hospitalist during your hospital stay. If you have any questions about your discharge medications or the care you received while you were in the hospital after you are discharged, you can call the unit and asked to speak with the hospitalist on call if the hospitalist that took care of you is not available. Once you are discharged, your primary care physician will handle any further medical issues. Please note that NO REFILLS for any discharge medications will be authorized once you are discharged, as it is imperative that you  return to your primary care physician (or establish a relationship with a primary care physician if you do not have one) for your aftercare needs so that they can reassess your need for medications and monitor your lab values.   08/11/17 1117   08/11/17 0000  (HEART FAILURE PATIENTS) Call MD:  Anytime you have any of the following symptoms: 1) 3 pound weight gain in 24 hours or 5 pounds in 1 week 2) shortness of breath, with or without a dry hacking cough 3) swelling in the hands, feet or stomach 4) if you have to sleep on extra pillows at night in order to breathe.     08/11/17 1117   08/11/17 0000  Call MD for:  extreme fatigue     08/11/17 1117   08/11/17 0000  Call MD for:  persistant dizziness or light-headedness     08/11/17 1117   08/11/17 0000  Call MD for:  hives     08/11/17 1117   08/11/17 0000  Call MD for:  difficulty breathing, headache or visual disturbances     08/11/17 1117   08/11/17 0000  Call MD for:  severe uncontrolled pain     08/11/17 1117   08/11/17 0000  Call MD for:  persistant nausea and vomiting     08/11/17 1117   08/11/17 0000  Call MD for:  temperature >100.4     08/11/17 1117     Follow-up Information    Maurice Small, MD. Schedule an appointment as soon as possible for a visit in 1 week(s).   Specialty:  Family Medicine Contact information: [address] [place]  [place] [address] [phone]          Allergies  Allergen Reactions  . Penicillins Anaphylaxis, Hives and Shortness Of Breath    Has patient had a PCN reaction causing immediate rash, facial/tongue/throat swelling, SOB or lightheadedness with hypotension: yes Has patient had a PCN reaction causing severe rash involving mucus membranes or skin necrosis: yes Has patient had a PCN reaction that required hospitalization no Has patient had a PCN reaction occurring within the last 10 years: yes If all of the above answers are "NO", then may proceed with Cephalosporin use.    Consultations:  None   Procedures/Studies: Dg Chest 2 View  Result Date: 08/09/2017 CLINICAL DATA:  Chest pain and shortness of breath. EXAM: CHEST  2 VIEW COMPARISON:  Radiographs 07/02/2016 FINDINGS: Mild cardiomegaly. There is vascular congestion. Perihilar bronchial thickening suggesting pulmonary edema. Fluid in the fissures without subpulmonic effusion. No focal airspace disease. No pneumothorax. No acute osseous abnormality. IMPRESSION: Mild cardiomegaly with vascular congestion and probable perihilar edema. Findings suggest mild fluid overload/CHF. Electronically Signed   By: Jeb Levering M.D.   On: 08/09/2017 03:13    Echo  Study Conclusions  - Left ventricle: The cavity size was normal. Wall thickness was   increased in a pattern of mild LVH. Systolic function was   moderately reduced. The estimated ejection fraction was 40%.   Diffuse hypokinesis. Doppler parameters are consistent with   restrictive physiology, indicative of decreased left ventricular   diastolic compliance and/or increased left atrial pressure.   Doppler parameters are consistent with high ventricular filling   pressure. - Regional wall motion abnormality: Moderate hypokinesis of the mid   inferoseptal and mid inferior myocardium. - Aortic valve: Moderately calcified annulus. Trileaflet; mildly   thickened leaflets. - Mitral valve:  Mildly thickened leaflets . There was mild   regurgitation. -  Right ventricle: Systolic function was mildly to moderately   reduced. - Tricuspid valve: There was moderate regurgitation. - Pulmonic valve: There was mild regurgitation. - Pulmonary arteries: PA peak pressure: 51 mm Hg (S).   Discharge Exam: Vitals:   08/11/17 0953 08/11/17 1133  BP:  (!) 138/94  Pulse:  73  Resp:    Temp:  98.2 F (36.8 C)  SpO2: 98% 98%   Vitals:   08/10/17 2046 08/11/17 0520 08/11/17 0953 08/11/17 1133  BP: (!) 152/89 (!) 132/96  (!) 138/94  Pulse: 89 71  73  Resp: 20 18    Temp: 98.3 F (36.8 C) 98.6 F (37 C)  98.2 F (36.8 C)  TempSrc: Oral Oral  Oral  SpO2: 98% 100% 98% 98%  Weight:  116.4 kg (256 lb 9.6 oz)    Height:        General: Pt is alert, awake, not in acute distress Cardiovascular: RRR, S1/S2 +, no rubs, no gallops Respiratory: CTA bilaterally, no wheezing, no rhonchi Abdominal: Soft, NT, ND, bowel sounds + Extremities: no edema, no cyanosis    The results of significant diagnostics from this hospitalization (including imaging, microbiology, ancillary and laboratory) are listed below for reference.     Microbiology: No results found for this or any previous visit (from the past 240 hour(s)).   Labs: BNP (last 3 results)  Recent Labs  08/09/17 0444 08/09/17 0954  BNP 464.2* 482.5*   Basic Metabolic Panel:  Recent Labs Lab 08/09/17 0423 08/10/17 0510 08/11/17 0505  NA 142 140 140  K 3.4* 3.3* 4.1  CL 106 101 104  CO2 27 28 27   GLUCOSE 107* 96 94  BUN 22* 18 21*  CREATININE 0.61 0.46 0.57  CALCIUM 9.6 9.6 9.4  MG  --   --  1.5*   Liver Function Tests: No results for input(s): AST, ALT, ALKPHOS, BILITOT, PROT, ALBUMIN in the last 168 hours. No results for input(s): LIPASE, AMYLASE in the last 168 hours. No results for input(s): AMMONIA in the last 168 hours. CBC:  Recent Labs Lab 08/09/17 0423  WBC 7.1  HGB 12.8  HCT 38.2  MCV 87.0  PLT  246   Cardiac Enzymes:  Recent Labs Lab 08/09/17 0954  TROPONINI <0.03   BNP: Invalid input(s): POCBNP CBG: No results for input(s): GLUCAP in the last 168 hours. D-Dimer No results for input(s): DDIMER in the last 72 hours. Hgb A1c No results for input(s): HGBA1C in the last 72 hours. Lipid Profile No results for input(s): CHOL, HDL, LDLCALC, TRIG, CHOLHDL, LDLDIRECT in the last 72 hours. Thyroid function studies No results for input(s): TSH, T4TOTAL, T3FREE, THYROIDAB in the last 72 hours.  Invalid input(s): FREET3 Anemia work up No results for input(s): VITAMINB12, FOLATE, FERRITIN, TIBC, IRON, RETICCTPCT in the last 72 hours. Urinalysis    Component Value Date/Time   COLORURINE YELLOW 03/14/2012 2200   APPEARANCEUR CLEAR 03/14/2012 2200   LABSPEC >1.046 (H) 03/14/2012 2200   PHURINE 6.5 03/14/2012 2200   GLUCOSEU NEGATIVE 03/14/2012 2200   HGBUR SMALL (A) 03/14/2012 2200   BILIRUBINUR neg 11/28/2014 0955   KETONESUR 40 (A) 03/14/2012 2200   PROTEINUR neg 11/28/2014 0955   PROTEINUR 30 (A) 03/14/2012 2200   UROBILINOGEN 0.2 11/28/2014 0955   UROBILINOGEN 1.0 03/14/2012 2200   NITRITE neg 11/28/2014 0955   NITRITE NEGATIVE 03/14/2012 2200   LEUKOCYTESUR Trace 11/28/2014 0955   Sepsis Labs Invalid input(s): PROCALCITONIN,  WBC,  LACTICIDVEN Microbiology No results found for  this or any previous visit (from the past 240 hour(s)).   Time coordinating discharge: 40 minutes  SIGNED:  Dessa Phi, DO [facility] Pager [phone]  If 7PM-7AM, please contact night-coverage [email or web address] Password [facility] 08/11/2017, 2:43 PM

## 2017-08-11 NOTE — Discharge Instructions (Signed)
DASH Eating Plan DASH stands for "Dietary Approaches to Stop Hypertension." The DASH eating plan is a healthy eating plan that has been shown to reduce high blood pressure (hypertension). It may also reduce your risk for type 2 diabetes, heart disease, and stroke. The DASH eating plan may also help with weight loss. What are tips for following this plan? General guidelines  Avoid eating more than 2,300 mg (milligrams) of salt (sodium) a day. If you have hypertension, you may need to reduce your sodium intake to 1,500 mg a day.  Limit alcohol intake to no more than 1 drink a day for nonpregnant women and 2 drinks a day for men. One drink equals 12 oz of beer, 5 oz of wine, or 1 oz of hard liquor.  Work with your health care provider to maintain a healthy body weight or to lose weight. Ask what an ideal weight is for you.  Get at least 30 minutes of exercise that causes your heart to beat faster (aerobic exercise) most days of the week. Activities may include walking, swimming, or biking.  Work with your health care provider or diet and nutrition specialist (dietitian) to adjust your eating plan to your individual calorie needs. Reading food labels  Check food labels for the amount of sodium per serving. Choose foods with less than 5 percent of the Daily Value of sodium. Generally, foods with less than 300 mg of sodium per serving fit into this eating plan.  To find whole grains, look for the word "whole" as the first word in the ingredient list. Shopping  Buy products labeled as "low-sodium" or "no salt added."  Buy fresh foods. Avoid canned foods and premade or frozen meals. Cooking  Avoid adding salt when cooking. Use salt-free seasonings or herbs instead of table salt or sea salt. Check with your health care provider or pharmacist before using salt substitutes.  Do not fry foods. Cook foods using healthy methods such as baking, boiling, grilling, and broiling instead.  Cook with  heart-healthy oils, such as olive, canola, soybean, or sunflower oil. Meal planning   Eat a balanced diet that includes: ? 5 or more servings of fruits and vegetables each day. At each meal, try to fill half of your plate with fruits and vegetables. ? Up to 6-8 servings of whole grains each day. ? Less than 6 oz of lean meat, poultry, or fish each day. A 3-oz serving of meat is about the same size as a deck of cards. One egg equals 1 oz. ? 2 servings of low-fat dairy each day. ? A serving of nuts, seeds, or beans 5 times each week. ? Heart-healthy fats. Healthy fats called Omega-3 fatty acids are found in foods such as flaxseeds and coldwater fish, like sardines, salmon, and mackerel.  Limit how much you eat of the following: ? Canned or prepackaged foods. ? Food that is high in trans fat, such as fried foods. ? Food that is high in saturated fat, such as fatty meat. ? Sweets, desserts, sugary drinks, and other foods with added sugar. ? Full-fat dairy products.  Do not salt foods before eating.  Try to eat at least 2 vegetarian meals each week.  Eat more home-cooked food and less restaurant, buffet, and fast food.  When eating at a restaurant, ask that your food be prepared with less salt or no salt, if possible. What foods are recommended? The items listed may not be a complete list. Talk with your dietitian about what   dietary choices are best for you. Grains Whole-grain or whole-wheat bread. Whole-grain or whole-wheat pasta. Brown rice. Oatmeal. Quinoa. Bulgur. Whole-grain and low-sodium cereals. Pita bread. Low-fat, low-sodium crackers. Whole-wheat flour tortillas. Vegetables Fresh or frozen vegetables (raw, steamed, roasted, or grilled). Low-sodium or reduced-sodium tomato and vegetable juice. Low-sodium or reduced-sodium tomato sauce and tomato paste. Low-sodium or reduced-sodium canned vegetables. Fruits All fresh, dried, or frozen fruit. Canned fruit in natural juice (without  added sugar). Meat and other protein foods Skinless chicken or turkey. Ground chicken or turkey. Pork with fat trimmed off. Fish and seafood. Egg whites. Dried beans, peas, or lentils. Unsalted nuts, nut butters, and seeds. Unsalted canned beans. Lean cuts of beef with fat trimmed off. Low-sodium, lean deli meat. Dairy Low-fat (1%) or fat-free (skim) milk. Fat-free, low-fat, or reduced-fat cheeses. Nonfat, low-sodium ricotta or cottage cheese. Low-fat or nonfat yogurt. Low-fat, low-sodium cheese. Fats and oils Soft margarine without trans fats. Vegetable oil. Low-fat, reduced-fat, or light mayonnaise and salad dressings (reduced-sodium). Canola, safflower, olive, soybean, and sunflower oils. Avocado. Seasoning and other foods Herbs. Spices. Seasoning mixes without salt. Unsalted popcorn and pretzels. Fat-free sweets. What foods are not recommended? The items listed may not be a complete list. Talk with your dietitian about what dietary choices are best for you. Grains Baked goods made with fat, such as croissants, muffins, or some breads. Dry pasta or rice meal packs. Vegetables Creamed or fried vegetables. Vegetables in a cheese sauce. Regular canned vegetables (not low-sodium or reduced-sodium). Regular canned tomato sauce and paste (not low-sodium or reduced-sodium). Regular tomato and vegetable juice (not low-sodium or reduced-sodium). Pickles. Olives. Fruits Canned fruit in a light or heavy syrup. Fried fruit. Fruit in cream or butter sauce. Meat and other protein foods Fatty cuts of meat. Ribs. Fried meat. Bacon. Sausage. Bologna and other processed lunch meats. Salami. Fatback. Hotdogs. Bratwurst. Salted nuts and seeds. Canned beans with added salt. Canned or smoked fish. Whole eggs or egg yolks. Chicken or turkey with skin. Dairy Whole or 2% milk, cream, and half-and-half. Whole or full-fat cream cheese. Whole-fat or sweetened yogurt. Full-fat cheese. Nondairy creamers. Whipped toppings.  Processed cheese and cheese spreads. Fats and oils Butter. Stick margarine. Lard. Shortening. Ghee. Bacon fat. Tropical oils, such as coconut, palm kernel, or palm oil. Seasoning and other foods Salted popcorn and pretzels. Onion salt, garlic salt, seasoned salt, table salt, and sea salt. Worcestershire sauce. Tartar sauce. Barbecue sauce. Teriyaki sauce. Soy sauce, including reduced-sodium. Steak sauce. Canned and packaged gravies. Fish sauce. Oyster sauce. Cocktail sauce. Horseradish that you find on the shelf. Ketchup. Mustard. Meat flavorings and tenderizers. Bouillon cubes. Hot sauce and Tabasco sauce. Premade or packaged marinades. Premade or packaged taco seasonings. Relishes. Regular salad dressings. Where to find more information:  National Heart, Lung, and Blood Institute: www.nhlbi.nih.gov  American Heart Association: www.heart.org Summary  The DASH eating plan is a healthy eating plan that has been shown to reduce high blood pressure (hypertension). It may also reduce your risk for type 2 diabetes, heart disease, and stroke.  With the DASH eating plan, you should limit salt (sodium) intake to 2,300 mg a day. If you have hypertension, you may need to reduce your sodium intake to 1,500 mg a day.  When on the DASH eating plan, aim to eat more fresh fruits and vegetables, whole grains, lean proteins, low-fat dairy, and heart-healthy fats.  Work with your health care provider or diet and nutrition specialist (dietitian) to adjust your eating plan to your individual   calorie needs. This information is not intended to replace advice given to you by your health care provider. Make sure you discuss any questions you have with your health care provider. Document Released: 10/30/2011 Document Revised: 11/03/2016 Document Reviewed: 11/03/2016 Elsevier Interactive Patient Education  2017 Elsevier Inc.  

## 2017-08-17 DIAGNOSIS — I5022 Chronic systolic (congestive) heart failure: Secondary | ICD-10-CM | POA: Diagnosis not present

## 2017-08-17 DIAGNOSIS — Z5181 Encounter for therapeutic drug level monitoring: Secondary | ICD-10-CM | POA: Diagnosis not present

## 2017-08-20 ENCOUNTER — Telehealth: Payer: Self-pay | Admitting: Cardiology

## 2017-08-20 NOTE — Telephone Encounter (Signed)
Received records from [place] for appointment on 09/02/17 with Dr Percival Spanish.  Records put with Dr Hochrein's schedule for 09/02/17. lp

## 2017-08-21 ENCOUNTER — Encounter: Payer: Self-pay | Admitting: Cardiology

## 2017-09-01 NOTE — Progress Notes (Signed)
Cardiology Office Note   Date:  09/04/2017   ID:  [name] [name], DOB July 10, 1961, MRN [id number]  PCP:  Maurice Small, MD  Cardiologist:   Minus Breeding, MD   Chief Complaint  Patient presents with  . Cardiomyopathy      History of Present Illness: [name] [name] is a 56 y.o. female who presents for surgical clearance.  She has had a history of left bundle branch block.  In 2013 she did have an echo with well-preserved ejection fraction but some mild mitral regurgitation.  I saw her in 2017 prior to a bariatric sleeve implant.  She was admitted in Sept with acute dyspnea and she had an echo which demonstrated an EF of 40%.    Of note the patient was being treated with Simponi Aria (golimumab).  She has since stopped this after consulting with Maurice Small, MD.  She has had no chest pain, neck or arm pain.  There has been no PND or orthopnea. There have been no reported palpitations, presyncope or syncope.  She is breathing much better after she came out of the hospital.  She is weighing herself daily but not particularly watching her salt.  Of note in the hospital the echo did suggest anteroseptal and mid and mid inferoseptal hypokinesis.   There was some TR and moderate pulmonary HTN.  This was some mild RV dysfunction.  In retrospect she said the symptoms have been coming on for about 2-3 months slowly.   Past Medical History:  Diagnosis Date  . Asthma   . Hypertension   . Lumbar herniated disc   . Pre-diabetes   . Rheumatoid arthritis ([place])   . Sleep apnea    has not received CPAP yet     Past Surgical History:  Procedure Laterality Date  . BREAST SURGERY     breast reduction   . LAPAROSCOPIC GASTRIC SLEEVE RESECTION N/A 09/29/2016   Procedure: LAPAROSCOPIC GASTRIC SLEEVE RESECTION, UPPER ENDO;  Surgeon: Greer Pickerel, MD;  Location: [facility];  Service: General;  Laterality: N/A;  . REDUCTION MAMMAPLASTY Bilateral 2003  . removed bone from left knee     . UPPER  GI ENDOSCOPY  09/29/2016   Procedure: UPPER GI ENDOSCOPY;  Surgeon: Greer Pickerel, MD;  Location: [facility];  Service: General;;     Current Outpatient Prescriptions  Medication Sig Dispense Refill  . albuterol (PROVENTIL HFA;VENTOLIN HFA) 108 (90 Base) MCG/ACT inhaler Inhale into the lungs every 6 (six) hours as needed for wheezing or shortness of breath.    . beclomethasone (QVAR) 80 MCG/ACT inhaler Inhale 2 puffs into the lungs 2 (two) times daily as needed (wheezing).    . Calcium 500-100 MG-UNIT CHEW Chew 1 tablet by mouth 3 (three) times daily.    . carvedilol (COREG) 6.25 MG tablet Take 1 tablet (6.25 mg total) by mouth 2 (two) times daily with a meal. 60 tablet 11  . fluticasone (FLONASE) 50 MCG/ACT nasal spray INHALE 1 SPRAY INTO EACH NOSTRIL ONCE DAILY  11  . furosemide (LASIX) 40 MG tablet Take 1 tablet (40 mg total) by mouth daily. 30 tablet 0  . lisinopril (PRINIVIL,ZESTRIL) 5 MG tablet Take 1 tablet (5 mg total) by mouth daily. 30 tablet 0  . Multiple Vitamin (MULTIVITAMIN WITH MINERALS) TABS tablet Take 1 tablet by mouth 2 (two) times daily.    . valACYclovir (VALTREX) 500 MG tablet TAKE 1 TABLET BY MOUTH EVERY DAY (Patient taking differently: TAKE 1 TABLET BY MOUTH EVERY DAY AS  NEEDED FOR FLARE UP) 30 tablet 0   No current facility-administered medications for this visit.     Allergies:   Penicillins; Amoxicillin; and Tdap [diphth-acell pertussis-tetanus]    ROS:  Please see the history of present illness.   Otherwise, review of systems are positive for none.   All other systems are reviewed and negative.    PHYSICAL EXAM: VS:  BP 126/80   Pulse 95   Ht 5\' 3"  (1.6 m)   Wt 255 lb (115.7 kg)   SpO2 98%   BMI 45.17 kg/m  , BMI Body mass index is 45.17 kg/m.  GENERAL:  Well appearing NECK:  No jugular venous distention, waveform within normal limits, carotid upstroke brisk and symmetric, no bruits, no thyromegaly LUNGS:  Clear to auscultation bilaterally CHEST:   Unremarkable HEART:  PMI not displaced or sustained,S1 and S2 within normal limits, no S3, no S4, no clicks, no rubs, no murmurs ABD:  Flat, positive bowel sounds normal in frequency in pitch, no bruits, no rebound, no guarding, no midline pulsatile mass, no hepatomegaly, no splenomegaly EXT:  2 plus pulses throughout, no edema, no cyanosis no clubbing    EKG:  EKG is ordered today. The ekg ordered today demonstrates left bundle branch block, rate 99, no acute ST-T wave changes.   Recent Labs: 09/30/2016: ALT 28 08/09/2017: B Natriuretic Peptide 414.5; Hemoglobin 12.8; Platelets 246 08/11/2017: BUN 21; Creatinine, Ser 0.57; Magnesium 1.5; Potassium 4.1; Sodium 140    Lipid Panel No results found for: CHOL, TRIG, HDL, CHOLHDL, VLDL, LDLCALC, LDLDIRECT    Wt Readings from Last 3 Encounters:  09/02/17 255 lb (115.7 kg)  08/11/17 256 lb 9.6 oz (116.4 kg)  06/01/17 252 lb 3.2 oz (114.4 kg)      Other studies Reviewed: Additional studies/ records that were reviewed today include: Office records.  Old echo and EKG. Review of the above records demonstrates:  Please see elsewhere in the note.     ASSESSMENT AND PLAN:   ACUTE SYSTOLIC HF:  This could have been related to the golimumab.  However, I'm going to have a low threshold for further workup of this. For now we talked about low-salt, when necessary Lasix and I'm going to increase her carvedilol to 6.25 mg twice daily. We can continue to titrate meds and then I'll repeat an echocardiogram. Unless she has normalization of her findings are probably proceed with right and left heart cath. This will allow me to exclude coronary disease as well as evaluate the evidence of pulmonary hypertension that she has. Other imaging such as MRI would be pending these results. We had a long discussion about this.  LBBB:    This is chronic.  No change in therapy.   MR:  This was only mild.  No change in therapy.   SLEEP APNEA:  She is getting a new  sleep study in the next couple of days.    HTN:  This is being managed in the context of treating his CHF   Current medicines are reviewed at length with the patient today.  The patient does not have concerns regarding medicines.  The following changes have been made:  As above.   Labs/ tests ordered today include:    No orders of the defined types were placed in this encounter.   Disposition:   FU with APP for med titration and then with me.     Signed, Minus Breeding, MD  09/04/2017 1:18 PM    [facility]

## 2017-09-02 ENCOUNTER — Ambulatory Visit (INDEPENDENT_AMBULATORY_CARE_PROVIDER_SITE_OTHER): Payer: 59 | Admitting: Cardiology

## 2017-09-02 ENCOUNTER — Encounter: Payer: Self-pay | Admitting: Cardiology

## 2017-09-02 VITALS — BP 126/80 | HR 95 | Ht 63.0 in | Wt 255.0 lb

## 2017-09-02 DIAGNOSIS — I10 Essential (primary) hypertension: Secondary | ICD-10-CM

## 2017-09-02 DIAGNOSIS — I5021 Acute systolic (congestive) heart failure: Secondary | ICD-10-CM | POA: Diagnosis not present

## 2017-09-02 MED ORDER — FUROSEMIDE 40 MG PO TABS: 40.0000 mg | ORAL_TABLET | Freq: Every day | ORAL | 0 refills | Status: DC

## 2017-09-02 MED ORDER — LISINOPRIL 5 MG PO TABS: 5.0000 mg | ORAL_TABLET | Freq: Every day | ORAL | 0 refills | Status: DC

## 2017-09-02 MED ORDER — CARVEDILOL 6.25 MG PO TABS: 6.2500 mg | ORAL_TABLET | Freq: Two times a day (BID) | ORAL | 11 refills | Status: DC

## 2017-09-02 NOTE — Patient Instructions (Signed)
Medication Instructions:  INCREASE- Carvedilol 6.25 mg twice a day   If you need a refill on your cardiac medications before your next appointment, please call your pharmacy.  Labwork: None Ordered   Testing/Procedures: None Ordered  Follow-Up: Your physician wants you to follow-up in: 1 Month with APP.   Thank you for choosing [facility] at [facility]!!

## 2017-09-04 ENCOUNTER — Encounter: Payer: Self-pay | Admitting: Cardiology

## 2017-09-07 DIAGNOSIS — G4733 Obstructive sleep apnea (adult) (pediatric): Secondary | ICD-10-CM | POA: Diagnosis not present

## 2017-09-09 DIAGNOSIS — G4733 Obstructive sleep apnea (adult) (pediatric): Secondary | ICD-10-CM | POA: Diagnosis not present

## 2017-09-23 DIAGNOSIS — G4733 Obstructive sleep apnea (adult) (pediatric): Secondary | ICD-10-CM | POA: Diagnosis not present

## 2017-10-01 DIAGNOSIS — R7303 Prediabetes: Secondary | ICD-10-CM | POA: Diagnosis not present

## 2017-10-01 DIAGNOSIS — G4733 Obstructive sleep apnea (adult) (pediatric): Secondary | ICD-10-CM | POA: Diagnosis not present

## 2017-10-02 ENCOUNTER — Ambulatory Visit: Payer: 59 | Admitting: Cardiology

## 2017-10-02 ENCOUNTER — Encounter: Payer: Self-pay | Admitting: Cardiology

## 2017-10-02 VITALS — BP 133/88 | HR 66 | Wt 264.0 lb

## 2017-10-02 DIAGNOSIS — I10 Essential (primary) hypertension: Secondary | ICD-10-CM | POA: Diagnosis not present

## 2017-10-02 DIAGNOSIS — I447 Left bundle-branch block, unspecified: Secondary | ICD-10-CM

## 2017-10-02 DIAGNOSIS — Z79899 Other long term (current) drug therapy: Secondary | ICD-10-CM | POA: Diagnosis not present

## 2017-10-02 DIAGNOSIS — M069 Rheumatoid arthritis, unspecified: Secondary | ICD-10-CM | POA: Insufficient documentation

## 2017-10-02 DIAGNOSIS — Z6841 Body Mass Index (BMI) 40.0 and over, adult: Secondary | ICD-10-CM | POA: Diagnosis not present

## 2017-10-02 DIAGNOSIS — G4733 Obstructive sleep apnea (adult) (pediatric): Secondary | ICD-10-CM

## 2017-10-02 DIAGNOSIS — I429 Cardiomyopathy, unspecified: Secondary | ICD-10-CM | POA: Diagnosis not present

## 2017-10-02 DIAGNOSIS — I5041 Acute combined systolic (congestive) and diastolic (congestive) heart failure: Secondary | ICD-10-CM | POA: Diagnosis not present

## 2017-10-02 DIAGNOSIS — I272 Pulmonary hypertension, unspecified: Secondary | ICD-10-CM | POA: Insufficient documentation

## 2017-10-02 MED ORDER — LISINOPRIL 10 MG PO TABS: 10.0000 mg | ORAL_TABLET | Freq: Every day | ORAL | 3 refills | Status: DC

## 2017-10-02 NOTE — Patient Instructions (Signed)
Medication Instructions:  Increase Lisinopril 10mg  daily If you need a refill on your cardiac medications before your next appointment, please call your pharmacy.  Labwork: BMP IN ONE WEEK (~ 10-09-17) HERE IN OUR OFFICE AT [facility]  Testing/Procedures: Your physician has requested that you have an echocardiogram-IN 6 WEEKS. Echocardiography is a painless test that uses sound waves to create images of your heart. It provides your doctor with information about the size and shape of your heart and how well your heart's chambers and valves are working. This procedure takes approximately one hour. There are no restrictions for this procedure.  Follow-Up: Your physician wants you to follow-up in: AFTER ECHO IN 6 WEEKS.    Thank you for choosing [facility] at [facility]!!

## 2017-10-02 NOTE — Assessment & Plan Note (Signed)
BMI 47 

## 2017-10-02 NOTE — Progress Notes (Signed)
10/02/2017 [name] [name]   06/19/1961  [id number]  Primary Physician Maurice Small, MD Primary Cardiologist: Dr Percival Spanish  HPI:  56 y/o morbidly obese female followed by Dr Percival Spanish with a history of HTN and LBBB. She was placed on Golimumab in June. She received one treatment in June and one in July. She says it really did not help her RA but in July she started noticing SOB, cough, and orthopnea. She was to have another treatment in September but ended up admitted to the hospital with CHF. Her BNP was 462. Her echo showed an EF of 40% with WMA, moderate RVD, and PA pressure of 51 mmHg. Dr Percival Spanish saw her 09/04/17 and increased her Coreg. She is in the office today for medication titration.   Since she last saw Dr Percival Spanish she has been doing well, she is trying to adjust to C-pap. She has no chest pain or increased dyspnea.     Current Outpatient Medications  Medication Sig Dispense Refill  . albuterol (PROVENTIL HFA;VENTOLIN HFA) 108 (90 Base) MCG/ACT inhaler Inhale into the lungs every 6 (six) hours as needed for wheezing or shortness of breath.    . beclomethasone (QVAR) 80 MCG/ACT inhaler Inhale 2 puffs into the lungs 2 (two) times daily as needed (wheezing).    . Calcium 500-100 MG-UNIT CHEW Chew 1 tablet by mouth 3 (three) times daily.    . carvedilol (COREG) 6.25 MG tablet Take 1 tablet (6.25 mg total) by mouth 2 (two) times daily with a meal. 60 tablet 11  . fluticasone (FLONASE) 50 MCG/ACT nasal spray INHALE 1 SPRAY INTO EACH NOSTRIL ONCE DAILY  11  . furosemide (LASIX) 40 MG tablet Take 1 tablet (40 mg total) by mouth daily. 30 tablet 0  . lisinopril (PRINIVIL,ZESTRIL) 10 MG tablet Take 1 tablet (10 mg total) daily by mouth. 30 tablet 3  . Multiple Vitamin (MULTIVITAMIN WITH MINERALS) TABS tablet Take 1 tablet by mouth 2 (two) times daily.    . valACYclovir (VALTREX) 500 MG tablet TAKE 1 TABLET BY MOUTH EVERY DAY (Patient taking differently: TAKE 1 TABLET BY MOUTH EVERY DAY  AS NEEDED FOR FLARE UP) 30 tablet 0   No current facility-administered medications for this visit.     Allergies  Allergen Reactions  . Penicillins Anaphylaxis, Hives and Shortness Of Breath    Has patient had a PCN reaction causing immediate rash, facial/tongue/throat swelling, SOB or lightheadedness with hypotension: yes Has patient had a PCN reaction causing severe rash involving mucus membranes or skin necrosis: yes Has patient had a PCN reaction that required hospitalization no Has patient had a PCN reaction occurring within the last 10 years: yes If all of the above answers are "NO", then may proceed with Cephalosporin use.  Marland Kitchen Amoxicillin Other (See Comments)    Upset stomach  . Tdap [Diphth-Acell Pertussis-Tetanus] Other (See Comments)    Headache /dehydration    Past Medical History:  Diagnosis Date  . Asthma   . Hypertension   . Lumbar herniated disc   . Pre-diabetes   . Rheumatoid arthritis ([place])   . Sleep apnea    has not received CPAP yet     Social History   Socioeconomic History  . Marital status: Married    Spouse name: Not on file  . Number of children: Not on file  . Years of education: Not on file  . Highest education level: Not on file  Social Needs  . Financial resource strain: Not on file  .  Food insecurity - worry: Not on file  . Food insecurity - inability: Not on file  . Transportation needs - medical: Not on file  . Transportation needs - non-medical: Not on file  Occupational History  . Occupation: Therapist, art  Tobacco Use  . Smoking status: Never Smoker  . Smokeless tobacco: Never Used  Substance and Sexual Activity  . Alcohol use: No    Alcohol/week: 0.0 oz  . Drug use: No  . Sexual activity: Yes    Birth control/protection: None  Other Topics Concern  . Not on file  Social History Narrative  . Not on file     Family History  Problem Relation Age of Onset  . Ovarian cancer Mother 47       Not sure of this diagnosis  .  Diabetes Brother   . Hypertension Brother   . Breast cancer Neg Hx      Review of Systems: General: negative for chills, fever, night sweats or weight changes.  Cardiovascular: negative for chest pain, dyspnea on exertion, edema, orthopnea, palpitations, paroxysmal nocturnal dyspnea or shortness of breath Dermatological: negative for rash Respiratory: negative for cough or wheezing Urologic: negative for hematuria Abdominal: negative for nausea, vomiting, diarrhea, bright red blood per rectum, melena, or hematemesis Neurologic: negative for visual changes, syncope, or dizziness All other systems reviewed and are otherwise negative except as noted above.    Blood pressure 133/88, pulse 66, weight 264 lb (119.7 kg).  General appearance: alert, cooperative, no distress and morbidly obese Lungs: clear to auscultation bilaterally Heart: regular rate and rhythm Extremities: trace edema Neurologic: Grossly normal   ASSESSMENT AND PLAN:   Acute CHF (congestive heart failure) ([place]) Admitted in Sept 2018 with CHF- ? Secondary to Golimumab  Cardiomyopathy ([place]) Etiology not yet determined-EF 40% with WMA, pulmonary HTN, and moderate RVD  Morbid obesity with BMI of 45.0-49.9, adult (HCC) BMI 47  Obstructive sleep apnea Now on C-pap but having trouble tolerating it  LBBB (left bundle branch block) Chronic  Essential hypertension Controlled  Pulmonary hypertension, unspecified (HCC) PA pressure 51 mmHg   Rheumatoid arthritis ([place]) .   PLAN  Increase Lisinopril to 10 mg. Check echo in 6 weeks, f/u with Dr Percival Spanish after that. If her LVF is not improved consider Rt and Lt heart cath.   Kerin Ransom PA-C 10/02/2017 8:27 AM

## 2017-10-02 NOTE — Assessment & Plan Note (Addendum)
Admitted in Sept 2018 with CHF- ? Secondary to Golimumab

## 2017-10-02 NOTE — Assessment & Plan Note (Signed)
Etiology not yet determined-EF 40% with WMA, pulmonary HTN, and moderate RVD

## 2017-10-02 NOTE — Assessment & Plan Note (Signed)
Now on C-pap but having trouble tolerating it

## 2017-10-02 NOTE — Assessment & Plan Note (Signed)
Controlled.  

## 2017-10-02 NOTE — Assessment & Plan Note (Signed)
PA pressure 51 mmHg

## 2017-10-02 NOTE — Assessment & Plan Note (Signed)
Chronic. 

## 2017-10-06 ENCOUNTER — Encounter: Payer: Self-pay | Admitting: Registered"

## 2017-10-06 ENCOUNTER — Encounter: Payer: 59 | Attending: General Surgery | Admitting: Registered"

## 2017-10-06 DIAGNOSIS — K449 Diaphragmatic hernia without obstruction or gangrene: Secondary | ICD-10-CM | POA: Insufficient documentation

## 2017-10-06 DIAGNOSIS — Z8679 Personal history of other diseases of the circulatory system: Secondary | ICD-10-CM | POA: Insufficient documentation

## 2017-10-06 DIAGNOSIS — M0579 Rheumatoid arthritis with rheumatoid factor of multiple sites without organ or systems involvement: Secondary | ICD-10-CM | POA: Insufficient documentation

## 2017-10-06 DIAGNOSIS — K912 Postsurgical malabsorption, not elsewhere classified: Secondary | ICD-10-CM | POA: Insufficient documentation

## 2017-10-06 DIAGNOSIS — R0683 Snoring: Secondary | ICD-10-CM | POA: Diagnosis not present

## 2017-10-06 DIAGNOSIS — M1990 Unspecified osteoarthritis, unspecified site: Secondary | ICD-10-CM | POA: Insufficient documentation

## 2017-10-06 DIAGNOSIS — E559 Vitamin D deficiency, unspecified: Secondary | ICD-10-CM | POA: Diagnosis not present

## 2017-10-06 DIAGNOSIS — R7303 Prediabetes: Secondary | ICD-10-CM | POA: Insufficient documentation

## 2017-10-06 DIAGNOSIS — Z713 Dietary counseling and surveillance: Secondary | ICD-10-CM | POA: Insufficient documentation

## 2017-10-06 DIAGNOSIS — L0591 Pilonidal cyst without abscess: Secondary | ICD-10-CM | POA: Insufficient documentation

## 2017-10-06 DIAGNOSIS — I10 Essential (primary) hypertension: Secondary | ICD-10-CM | POA: Diagnosis not present

## 2017-10-06 DIAGNOSIS — Z8249 Family history of ischemic heart disease and other diseases of the circulatory system: Secondary | ICD-10-CM | POA: Diagnosis not present

## 2017-10-06 DIAGNOSIS — E669 Obesity, unspecified: Secondary | ICD-10-CM

## 2017-10-06 DIAGNOSIS — Z79899 Other long term (current) drug therapy: Secondary | ICD-10-CM | POA: Diagnosis not present

## 2017-10-06 DIAGNOSIS — Z9884 Bariatric surgery status: Secondary | ICD-10-CM | POA: Insufficient documentation

## 2017-10-06 DIAGNOSIS — Z87891 Personal history of nicotine dependence: Secondary | ICD-10-CM | POA: Insufficient documentation

## 2017-10-06 DIAGNOSIS — Z880 Allergy status to penicillin: Secondary | ICD-10-CM | POA: Diagnosis not present

## 2017-10-06 DIAGNOSIS — G4733 Obstructive sleep apnea (adult) (pediatric): Secondary | ICD-10-CM | POA: Diagnosis not present

## 2017-10-06 NOTE — Progress Notes (Signed)
Follow-up visit:  1 Year Post-Operative Sleeve Gastrectomy Surgery Medical Nutrition Therapy:  Appt start time: 9:17 end time: 9:56  Primary concerns today: Post-operative Bariatric Surgery Nutrition Management.   Surgery date: 09/29/2016 Surgery type: Sleeve gastrectomy Start weight at [facility]: 295 lbs on 07/01/2016 Weight today: 258.6 lbs Wt change: 6 lbs gain from 252.2 lbs on 06/01/2017    TANITA  BODY COMP RESULTS  08/25/16 10/14/16 11/25/16 12/31/16 06/01/2017 10/06/2017   BMI (kg/m^2) 50.8 46.6 44.8 43.3 44.7 N/A   Fat Mass (lbs) 166.6 144.0 133.6 128.2 133.4    Fat Free Mass (lbs) 129.2 127.2 127.4 123.8 118.8    Total Body Water (lbs) 96.2 93.6 93.4 90.4 87     Pt states she is on a 2200 mg sodium restriction due to hospitalization mild heart failure. Pt reports her primary concern is combining sodium-restriction diet with bariatric recommendations. Pt states she has  rheumatoid arthritis, recent surgery on knees; unable to walk or do any physical activity. Pt reports cardiologist will let her know recommended physical activities she is able to do within a week or so. Pt states she feels ok, rain makes all muscles in her body hurt. Pt is doing well with meeting protein and fluid goals, along with taking the necessary vitamins and minerals as recommended.    24-hr recall: B (9-11 AM): egg (6g), slice of wheat bread  Snk (1PM): heart healthy dry-roasted peanuts (7g), protein shake (30g) L (3:30PM): peanuts (7g) Snack: peanuts (7g) D (PM): Meat and cabbage or spinach (21g) protein bar (7g)  Snk (PM): peanuts (7g)                Fluid intake: regular coffee, 64 oz water, 11-17 oz protein shake (53-60oz); 64+ ounces Estimated total protein intake: 92 g  Medications: see list  Supplementation: taking Bariatric Advantage EA, taking calcium 3 x day  Using straws: No Drinking while eating: No Hair loss: A little bit Carbonated beverages: No N/V/D/C: No Dumping syndrome: No Having you  been chewing well: yes Chewing/swallowing difficulties: no Changes in vision: no Changes to mood/headaches: no Hair loss/Changes to skin/Changes to nails: no Any difficulty focusing or concentrating: no Sweating: no Dizziness/Lightheaded: no Palpitations: no Abdominal Pain: no  Recent physical activity:  ADL's  Progress Towards Goal(s):  In progress.  Handouts given during visit include:  Arm Exercises  Low-Sodium Nutrition Therapy   Nutritional Diagnosis:  [place]-3.3 Overweight/obesity related to past poor dietary habits and physical inactivity as evidenced by patient w/ recent sleeve gastrectomy surgery following dietary guidelines for continued weight loss.    Intervention:  Nutrition education and counseling on combining low-sodium diet with bariatric surgery recommendations. Goals:  Follow Bariatric Surgery Specialized Post-Op Diet (ideas)  Aim for maximum of 15 grams of carbs per meal/10-15 grams per snack  Avoid white starches and starchy veggies (potatoes, peas, corn, etc)  Avoid sweetened drinks  Eat 3-6 small meals/snacks, every 3-5 hrs  No meal skipping  Increase lean protein foods to meet 60-80g goal  Always have a protein source with carbs  Increase fluid intake to 64oz +  Aim for >30 min of physical activity daily    Resume daily vitamin supplementation   *(1) complete multivitamin with iron and 2-3 doses of 500 mg calcium citrate  * Make sure to separate the multivitamin and calcium by 2 hours to prevent iron/calcium from binding together and leaving your body  * Make sure to take your calcium in 3 separate doses because your body  cannot absorb more than 500 mg at a time   Teaching Method Utilized:  Visual Auditory Hands on  Preferred Learning Style:   No preference indicated   Learning Readiness:   Ready  Barriers to learning/adherence to lifestyle change: none  Demonstrated degree of understanding via:  Teach Back    Monitoring/Evaluation:  Dietary intake, exercise, and body weight. Follow up prn.

## 2017-10-06 NOTE — Patient Instructions (Signed)
Goals:  Follow Bariatric Surgery Specialized Post-Op Diet (ideas)  Aim for maximum of 15 grams of carbs per meal/10-15 grams per snack  Avoid white starches and starchy veggies (potatoes, peas, corn, etc)  Avoid sweetened drinks  Eat 3-6 small meals/snacks, every 3-5 hrs  No meal skipping  Increase lean protein foods to meet 60-80g goal  Always have a protein source with carbs  Increase fluid intake to 64oz +  Aim for >30 min of physical activity daily    Resume daily vitamin supplementation   *(1) complete multivitamin with iron and 2-3 doses of 500 mg calcium citrate  * Make sure to separate the multivitamin and calcium by 2 hours to prevent iron/calcium from binding together and leaving your body  * Make sure to take your calcium in 3 separate doses because your body cannot absorb more than 500 mg at a time

## 2017-10-07 ENCOUNTER — Other Ambulatory Visit: Payer: Self-pay | Admitting: Cardiology

## 2017-10-21 DIAGNOSIS — M2550 Pain in unspecified joint: Secondary | ICD-10-CM | POA: Diagnosis not present

## 2017-10-21 DIAGNOSIS — M0579 Rheumatoid arthritis with rheumatoid factor of multiple sites without organ or systems involvement: Secondary | ICD-10-CM | POA: Diagnosis not present

## 2017-10-21 DIAGNOSIS — M5416 Radiculopathy, lumbar region: Secondary | ICD-10-CM | POA: Diagnosis not present

## 2017-10-23 DIAGNOSIS — G4733 Obstructive sleep apnea (adult) (pediatric): Secondary | ICD-10-CM | POA: Diagnosis not present

## 2017-11-06 DIAGNOSIS — Z79899 Other long term (current) drug therapy: Secondary | ICD-10-CM | POA: Diagnosis not present

## 2017-11-06 DIAGNOSIS — I5041 Acute combined systolic (congestive) and diastolic (congestive) heart failure: Secondary | ICD-10-CM | POA: Diagnosis not present

## 2017-11-06 LAB — BASIC METABOLIC PANEL
BUN/Creatinine Ratio: 35 — ABNORMAL HIGH (ref 9–23)
BUN: 22 mg/dL (ref 6–24)
CO2: 29 mmol/L (ref 20–29)
Calcium: 9.9 mg/dL (ref 8.7–10.2)
Chloride: 98 mmol/L (ref 96–106)
Creatinine, Ser: 0.63 mg/dL (ref 0.57–1.00)
GFR calc Af Amer: 114 mL/min/{1.73_m2} (ref >59)
GFR calc non Af Amer: 99 mL/min/{1.73_m2} (ref >59)
Glucose: 102 mg/dL — ABNORMAL HIGH (ref 65–99)
Potassium: 4.2 mmol/L (ref 3.5–5.2)
Sodium: 142 mmol/L (ref 134–144)

## 2017-11-12 ENCOUNTER — Encounter (INDEPENDENT_AMBULATORY_CARE_PROVIDER_SITE_OTHER): Payer: Self-pay

## 2017-11-12 ENCOUNTER — Ambulatory Visit (HOSPITAL_COMMUNITY): Payer: 59 | Attending: Cardiovascular Disease

## 2017-11-12 ENCOUNTER — Other Ambulatory Visit: Payer: Self-pay

## 2017-11-12 DIAGNOSIS — I071 Rheumatic tricuspid insufficiency: Secondary | ICD-10-CM | POA: Diagnosis not present

## 2017-11-12 DIAGNOSIS — I5041 Acute combined systolic (congestive) and diastolic (congestive) heart failure: Secondary | ICD-10-CM | POA: Diagnosis not present

## 2017-11-18 ENCOUNTER — Telehealth: Payer: Self-pay | Admitting: Cardiology

## 2017-11-18 NOTE — Telephone Encounter (Signed)
Left message to discuss echo results and plans for Rt and Lt heart cath.   Kerin Ransom PA-C 11/18/2017 4:41 PM

## 2017-11-19 ENCOUNTER — Telehealth: Payer: Self-pay | Admitting: Cardiology

## 2017-11-19 NOTE — Telephone Encounter (Signed)
I spoke with Ms [name] [name] about having a Rt and Lt heart cath to further evaluate her cardiomyopathy. The patient understands that risks included but are not limited to stroke (1 in 1000), death (1 in 84), kidney failure [usually temporary] (1 in 500), bleeding (1 in 200), allergic reaction [possibly serious] (1 in 200).  The patient understands and agrees to proceed. She will contact us about the timing.  Kerin Ransom PA-C 11/19/2017 8:13 AM

## 2017-11-23 ENCOUNTER — Telehealth: Payer: Self-pay | Admitting: Cardiology

## 2017-11-23 DIAGNOSIS — G4733 Obstructive sleep apnea (adult) (pediatric): Secondary | ICD-10-CM | POA: Diagnosis not present

## 2017-11-23 NOTE — Telephone Encounter (Signed)
New Message  Pt returning call to L. Kilroy. Please call back to discuss

## 2017-11-23 NOTE — Telephone Encounter (Signed)
Pt called back about Rt and Lt heart cath. She says she is applying for disability but hasn't received it yet. She tells me she cannot afford to have the cath now. I suggested she contact us when she is financially able to have the cath- hopefully in the next month or two.  Kerin Ransom PA-C 11/23/2017 3:38 PM

## 2017-12-08 ENCOUNTER — Encounter: Payer: Self-pay | Admitting: Cardiology

## 2017-12-08 NOTE — Progress Notes (Signed)
Cardiology Office Note   Date:  12/10/2017   ID:  [name] [name], DOB Aug 18, 1961, MRN [id number]  PCP:  Maurice Small, MD  Cardiologist:   Minus Breeding, MD   Chief Complaint  Patient presents with  . Cardiomyopathy      History of Present Illness: [name] [name] is a 57 y.o. female who presents for follow up of cardiomyopathy.   She has had a history of left bundle branch block.  In 2013 she did have an echo with well-preserved ejection fraction but some mild mitral regurgitation.  I saw her in 2017 prior to a bariatric sleeve implant.  She was admitted in Sept with acute dyspnea and she had an echo which demonstrated an EF of 40%.  Of note the patient was being treated with Simponi Aria (golimumab).  We had suggested right and left heart cath but because of cost she chose not to have this yet.  She feels OK.  She is cutting back all expenses and she cannot afford a gym membership and she is limited in activity with joint pain.  She is trying to watch her diet.  She tolerated increase lisinopril at the last visit.  The patient denies any new symptoms such as chest discomfort, neck or arm discomfort. There has been no new shortness of breath, PND or orthopnea. There have been no reported palpitations, presyncope or syncope.   Past Medical History:  Diagnosis Date  . Asthma   . Hypertension   . Lumbar herniated disc   . Pre-diabetes   . Rheumatoid arthritis ([place])   . Sleep apnea    CPAP    Past Surgical History:  Procedure Laterality Date  . BREAST SURGERY     breast reduction   . LAPAROSCOPIC GASTRIC SLEEVE RESECTION N/A 09/29/2016   Procedure: LAPAROSCOPIC GASTRIC SLEEVE RESECTION, UPPER ENDO;  Surgeon: Greer Pickerel, MD;  Location: [facility];  Service: General;  Laterality: N/A;  . REDUCTION MAMMAPLASTY Bilateral 2003  . removed bone from left knee     . UPPER GI ENDOSCOPY  09/29/2016   Procedure: UPPER GI ENDOSCOPY;  Surgeon: Greer Pickerel, MD;  Location: [facility];   Service: General;;     Current Outpatient Medications  Medication Sig Dispense Refill  . albuterol (PROVENTIL HFA;VENTOLIN HFA) 108 (90 Base) MCG/ACT inhaler Inhale into the lungs every 6 (six) hours as needed for wheezing or shortness of breath.    . beclomethasone (QVAR) 80 MCG/ACT inhaler Inhale 2 puffs into the lungs 2 (two) times daily as needed (wheezing).    . Calcium 500-100 MG-UNIT CHEW Chew 1 tablet by mouth 3 (three) times daily.    . carvedilol (COREG) 12.5 MG tablet Take 1 tablet (12.5 mg total) by mouth 2 (two) times daily with a meal. 180 tablet 3  . fluticasone (FLONASE) 50 MCG/ACT nasal spray INHALE 1 SPRAY INTO EACH NOSTRIL ONCE DAILY  11  . furosemide (LASIX) 40 MG tablet TAKE 1 TABLET BY MOUTH EVERY DAY 30 tablet 10  . lisinopril (PRINIVIL,ZESTRIL) 10 MG tablet Take 1 tablet (10 mg total) daily by mouth. 30 tablet 3  . methotrexate 50 MG/2ML injection INJ 0.6 ML UTD Q WEEK  1  . Multiple Vitamin (MULTIVITAMIN WITH MINERALS) TABS tablet Take 1 tablet by mouth 2 (two) times daily.    . valACYclovir (VALTREX) 500 MG tablet TAKE 1 TABLET BY MOUTH EVERY DAY (Patient taking differently: TAKE 1 TABLET BY MOUTH EVERY DAY AS NEEDED FOR FLARE UP) 30 tablet 0  No current facility-administered medications for this visit.     Allergies:   Penicillins; Amoxicillin; and Tdap [diphth-acell pertussis-tetanus]    ROS:  Please see the history of present illness.   Otherwise, review of systems are positive for none.   All other systems are reviewed and negative.    PHYSICAL EXAM: VS:  BP 131/86   Pulse 78   Ht 5\' 3"  (1.6 m)   Wt 261 lb 6.4 oz (118.6 kg)   BMI 46.30 kg/m  , BMI Body mass index is 46.3 kg/m.  GENERAL:  Well appearing NECK:  No jugular venous distention, waveform within normal limits, carotid upstroke brisk and symmetric, no bruits, no thyromegaly LUNGS:  Clear to auscultation bilaterally CHEST:  Unremarkable HEART:  PMI not displaced or sustained,S1 and S2 within  normal limits, no S3, no S4, no clicks, no rubs, no murmurs ABD:  Flat, positive bowel sounds normal in frequency in pitch, no bruits, no rebound, no guarding, no midline pulsatile mass, no hepatomegaly, no splenomegaly EXT:  2 plus pulses throughout, no edema, no cyanosis no clubbing   EKG:  EKG is not ordered today.    Recent Labs: 08/09/2017: B Natriuretic Peptide 414.5; Hemoglobin 12.8; Platelets 246 08/11/2017: Magnesium 1.5 11/06/2017: BUN 22; Creatinine, Ser 0.63; Potassium 4.2; Sodium 142    Lipid Panel No results found for: CHOL, TRIG, HDL, CHOLHDL, VLDL, LDLCALC, LDLDIRECT    Wt Readings from Last 3 Encounters:  12/10/17 261 lb 6.4 oz (118.6 kg)  10/06/17 258 lb 9.6 oz (117.3 kg)  10/02/17 264 lb (119.7 kg)      Other studies Reviewed: Additional studies/ records that were reviewed today include: None Review of the above records demonstrates:    ASSESSMENT AND PLAN:   ACUTE SYSTOLIC HF:  This could have been related to the golimumab.  She did not want a right and left heart cath because of cost.  Today I will increase her carvedilol to 12.5 mg twice a day.  She can come back in 1 month for her next med titration.  After we completed med titration we can repeat an echocardiogram and decide on whether she needs a right heart cath.   PULMONARY HTN: This will be followed with echo.  LBBB:   This is chronic.  No change in therapy.  MR: This is mild and will be followed as above.  SLEEP APNEA: She is getting treatment with CPAP.   HTN:   This is being managed in the context of treating his CHF   Current medicines are reviewed at length with the patient today.  The patient does not have concerns regarding medicines.  The following changes have been made:  As above  Labs/ tests ordered today include:  None  No orders of the defined types were placed in this encounter.   Disposition:   FU with Kerin Ransom in one month.     Signed, Minus Breeding, MD    12/10/2017 9:33 AM    [facility]

## 2017-12-10 ENCOUNTER — Encounter: Payer: Self-pay | Admitting: Cardiology

## 2017-12-10 ENCOUNTER — Ambulatory Visit: Payer: 59 | Admitting: Cardiology

## 2017-12-10 VITALS — BP 131/86 | HR 78 | Ht 63.0 in | Wt 261.4 lb

## 2017-12-10 DIAGNOSIS — G4733 Obstructive sleep apnea (adult) (pediatric): Secondary | ICD-10-CM | POA: Diagnosis not present

## 2017-12-10 DIAGNOSIS — I429 Cardiomyopathy, unspecified: Secondary | ICD-10-CM | POA: Diagnosis not present

## 2017-12-10 DIAGNOSIS — I272 Pulmonary hypertension, unspecified: Secondary | ICD-10-CM

## 2017-12-10 DIAGNOSIS — Z6841 Body Mass Index (BMI) 40.0 and over, adult: Secondary | ICD-10-CM

## 2017-12-10 MED ORDER — CARVEDILOL 12.5 MG PO TABS: 12.5000 mg | ORAL_TABLET | Freq: Two times a day (BID) | ORAL | 3 refills | Status: DC

## 2017-12-10 NOTE — Patient Instructions (Signed)
Medication Instructions:  INCREASE- Carvedilol 12.5 mg twice a day  If you need a refill on your cardiac medications before your next appointment, please call your pharmacy.  Labwork: None Ordered   Testing/Procedures: None Ordered  Follow-Up: Your physician wants you to follow-up in: 1 Month with [employer].    Thank you for choosing [facility] at [facility]!!

## 2017-12-14 DIAGNOSIS — G4733 Obstructive sleep apnea (adult) (pediatric): Secondary | ICD-10-CM | POA: Diagnosis not present

## 2017-12-21 DIAGNOSIS — M0579 Rheumatoid arthritis with rheumatoid factor of multiple sites without organ or systems involvement: Secondary | ICD-10-CM | POA: Diagnosis not present

## 2017-12-21 DIAGNOSIS — M797 Fibromyalgia: Secondary | ICD-10-CM | POA: Diagnosis not present

## 2017-12-24 DIAGNOSIS — G4733 Obstructive sleep apnea (adult) (pediatric): Secondary | ICD-10-CM | POA: Diagnosis not present

## 2018-01-11 ENCOUNTER — Ambulatory Visit: Payer: 59 | Admitting: Cardiology

## 2018-01-11 ENCOUNTER — Encounter: Payer: Self-pay | Admitting: Cardiology

## 2018-01-11 VITALS — BP 132/82 | HR 66 | Ht 63.0 in | Wt 263.2 lb

## 2018-01-11 DIAGNOSIS — I447 Left bundle-branch block, unspecified: Secondary | ICD-10-CM

## 2018-01-11 DIAGNOSIS — I10 Essential (primary) hypertension: Secondary | ICD-10-CM | POA: Diagnosis not present

## 2018-01-11 DIAGNOSIS — M069 Rheumatoid arthritis, unspecified: Secondary | ICD-10-CM

## 2018-01-11 DIAGNOSIS — G4733 Obstructive sleep apnea (adult) (pediatric): Secondary | ICD-10-CM | POA: Diagnosis not present

## 2018-01-11 DIAGNOSIS — I429 Cardiomyopathy, unspecified: Secondary | ICD-10-CM | POA: Diagnosis not present

## 2018-01-11 LAB — BASIC METABOLIC PANEL
BUN/Creatinine Ratio: 26 — ABNORMAL HIGH (ref 9–23)
BUN: 15 mg/dL (ref 6–24)
CO2: 29 mmol/L (ref 20–29)
Calcium: 9.4 mg/dL (ref 8.7–10.2)
Chloride: 101 mmol/L (ref 96–106)
Creatinine, Ser: 0.57 mg/dL (ref 0.57–1.00)
GFR calc Af Amer: 117 mL/min/{1.73_m2} (ref >59)
GFR calc non Af Amer: 102 mL/min/{1.73_m2} (ref >59)
Glucose: 96 mg/dL (ref 65–99)
Potassium: 4.2 mmol/L (ref 3.5–5.2)
Sodium: 143 mmol/L (ref 134–144)

## 2018-01-11 MED ORDER — LISINOPRIL 10 MG PO TABS: 10.0000 mg | ORAL_TABLET | Freq: Every day | ORAL | 3 refills | Status: DC

## 2018-01-11 NOTE — Progress Notes (Signed)
01/11/2018 [name] [name]   [birth year]  [id number]  Primary Physician Maurice Small, MD Primary Cardiologist: Dr Percival Spanish  HPI:  Pleasant 57 y.o. obese AA female who presents for follow up of cardiomyopathy.  She has had a history of chronic LBBB, obesity, RA, and HTN.  In 2013 she had an echo that showed well-preserved LVF but some mild mitral regurgitation.  She had another echo in 2017 prior to a bariatric sleeve implant that again showed normal LVF.  She was admitted in Sept 2018 with acute dyspnea and she had an echo which demonstrated an EF of 40%.  Of note the patient was being treated with Simponi Aria (golimumab) for her RA.  We have been following her since as an OP, adjusting her medications. She has been started on C-pap but has had some difficulty tolerating it. She is using it about 4 hours a night. She was offered but declined a Rt and Lt heart cath (costs). She saw Dr Percival Spanish 12/10/17 and her Coreg was increased. She has tolerated her medications well. Her B/P by me was low- 106/70. Her HR today is 60. No orthopnea or unusual dyspnea.    Current Outpatient Medications  Medication Sig Dispense Refill  . albuterol (PROVENTIL HFA;VENTOLIN HFA) 108 (90 Base) MCG/ACT inhaler Inhale into the lungs every 6 (six) hours as needed for wheezing or shortness of breath.    . beclomethasone (QVAR) 80 MCG/ACT inhaler Inhale 2 puffs into the lungs 2 (two) times daily as needed (wheezing).    . Calcium 500-100 MG-UNIT CHEW Chew 1 tablet by mouth 3 (three) times daily.    . carvedilol (COREG) 12.5 MG tablet Take 1 tablet (12.5 mg total) by mouth 2 (two) times daily with a meal. 180 tablet 3  . fluticasone (FLONASE) 50 MCG/ACT nasal spray INHALE 1 SPRAY INTO EACH NOSTRIL ONCE DAILY  11  . furosemide (LASIX) 40 MG tablet TAKE 1 TABLET BY MOUTH EVERY DAY 30 tablet 10  . lisinopril (PRINIVIL,ZESTRIL) 10 MG tablet Take 1 tablet (10 mg total) by mouth daily. 90 tablet 3  . methotrexate 50 MG/2ML  injection INJ 0.6 ML UTD Q WEEK  1  . Multiple Vitamin (MULTIVITAMIN WITH MINERALS) TABS tablet Take 1 tablet by mouth 2 (two) times daily.    . valACYclovir (VALTREX) 500 MG tablet TAKE 1 TABLET BY MOUTH EVERY DAY (Patient taking differently: TAKE 1 TABLET BY MOUTH EVERY DAY AS NEEDED FOR FLARE UP) 30 tablet 0   No current facility-administered medications for this visit.     Allergies  Allergen Reactions  . Penicillins Anaphylaxis, Hives and Shortness Of Breath    Has patient had a PCN reaction causing immediate rash, facial/tongue/throat swelling, SOB or lightheadedness with hypotension: yes Has patient had a PCN reaction causing severe rash involving mucus membranes or skin necrosis: yes Has patient had a PCN reaction that required hospitalization no Has patient had a PCN reaction occurring within the last 10 years: yes If all of the above answers are "NO", then may proceed with Cephalosporin use.  Marland Kitchen Amoxicillin Other (See Comments)    Upset stomach  . Tdap [Diphth-Acell Pertussis-Tetanus] Other (See Comments)    Headache /dehydration    Past Medical History:  Diagnosis Date  . Asthma   . Hypertension   . Lumbar herniated disc   . Pre-diabetes   . Rheumatoid arthritis ([place])   . Sleep apnea    CPAP    Social History   Socioeconomic History  . Marital  status: Married    Spouse name: Not on file  . Number of children: Not on file  . Years of education: Not on file  . Highest education level: Not on file  Social Needs  . Financial resource strain: Not on file  . Food insecurity - worry: Never true  . Food insecurity - inability: Never true  . Transportation needs - medical: Not on file  . Transportation needs - non-medical: Not on file  Occupational History  . Occupation: Therapist, art  Tobacco Use  . Smoking status: Never Smoker  . Smokeless tobacco: Never Used  Substance and Sexual Activity  . Alcohol use: No    Alcohol/week: 0.0 oz  . Drug use: No  .  Sexual activity: Yes    Birth control/protection: None  Other Topics Concern  . Not on file  Social History Narrative  . Not on file     Family History  Problem Relation Age of Onset  . Ovarian cancer Mother 86       Not sure of this diagnosis  . Diabetes Brother   . Hypertension Brother   . Breast cancer Neg Hx      Review of Systems: General: negative for chills, fever, night sweats or weight changes.  Cardiovascular: negative for chest pain, dyspnea on exertion, edema, orthopnea, palpitations, paroxysmal nocturnal dyspnea or shortness of breath Dermatological: negative for rash Respiratory: negative for cough or wheezing Urologic: negative for hematuria Abdominal: negative for nausea, vomiting, diarrhea, bright red blood per rectum, melena, or hematemesis Neurologic: negative for visual changes, syncope, or dizziness All other systems reviewed and are otherwise negative except as noted above.    Blood pressure 132/82, pulse 66, height 5\' 3"  (1.6 m), weight 263 lb 3.2 oz (119.4 kg).  General appearance: alert, cooperative, no distress and morbidly obese Lungs: clear to auscultation bilaterally Heart: regular rate and rhythm Skin: Skin color, texture, turgor normal. No rashes or lesions Neurologic: Grossly normal   ASSESSMENT AND PLAN:   H/O CHF (congestive heart failure) ([place]) Admitted in Sept 2018 with CHF and new LVD- ? Secondary to Golimumab  Cardiomyopathy ([place]) Etiology not yet determined-EF 40% with WMA, pulmonary HTN, and moderate RVD. Pt declined Rt and Lt heart cath secondary to cost.  Morbid obesity with BMI of 45.0-49.9, adult (HCC) BMI 47. H/O gastric sleeve 2017  Obstructive sleep apnea Now on C-pap but having trouble tolerating it more that 4 hours a night  LBBB (left bundle branch block) Chronic  Essential hypertension Controlled- B/P by me 106/70  Pulmonary hypertension, unspecified (HCC) PA pressure 51 mmHg   Rheumatoid arthritis  ([place]) .   PLAN  I did not change her medications. Check an echo in one month then f/u with Dr Percival Spanish. Check BMP today on ACE.   Kerin Ransom PA-C 01/11/2018 8:27 AM

## 2018-01-11 NOTE — Patient Instructions (Signed)
Medication Instructions:  Continue current medications  If you need a refill on your cardiac medications before your next appointment, please call your pharmacy.  Labwork: BMP Today HERE IN OUR OFFICE AT [facility]  Take the provided lab slips for you to take with you to the lab for you blood draw.   You will NOT need to fast   You may go to any [facility] lab that is convenient for you however, we do have a lab in our office that is able to assist you. You do NOT need an appointment for our lab. Once in our office lobby there is a podium to the right of the check-in desk where you are to sign-in and ring a doorbell to alert [place] you are here. Lab is open Monday-Friday from 8:00am to 4:00pm; and is closed for lunch from 12:45p-1:45pm   Testing/Procedures: Your physician has requested that you have an echocardiogram in 1 Month. Echocardiography is a painless test that uses sound waves to create images of your heart. It provides your doctor with information about the size and shape of your heart and how well your heart's chambers and valves are working. This procedure takes approximately one hour. There are no restrictions for this procedure.   Follow-Up: Your physician wants you to follow-up in: Dr Percival Spanish after Echo.     Thank you for choosing [facility] at [facility]!!

## 2018-01-21 DIAGNOSIS — G4733 Obstructive sleep apnea (adult) (pediatric): Secondary | ICD-10-CM | POA: Diagnosis not present

## 2018-01-25 ENCOUNTER — Other Ambulatory Visit: Payer: Self-pay | Admitting: Family Medicine

## 2018-01-25 DIAGNOSIS — Z1231 Encounter for screening mammogram for malignant neoplasm of breast: Secondary | ICD-10-CM

## 2018-01-28 ENCOUNTER — Other Ambulatory Visit: Payer: Self-pay | Admitting: Cardiology

## 2018-01-28 NOTE — Telephone Encounter (Signed)
REFILL 

## 2018-02-08 ENCOUNTER — Other Ambulatory Visit: Payer: Self-pay

## 2018-02-08 ENCOUNTER — Ambulatory Visit (HOSPITAL_COMMUNITY): Payer: 59 | Attending: Cardiovascular Disease

## 2018-02-08 DIAGNOSIS — I110 Hypertensive heart disease with heart failure: Secondary | ICD-10-CM | POA: Diagnosis not present

## 2018-02-08 DIAGNOSIS — G4733 Obstructive sleep apnea (adult) (pediatric): Secondary | ICD-10-CM | POA: Diagnosis not present

## 2018-02-08 DIAGNOSIS — I509 Heart failure, unspecified: Secondary | ICD-10-CM | POA: Insufficient documentation

## 2018-02-08 DIAGNOSIS — I429 Cardiomyopathy, unspecified: Secondary | ICD-10-CM | POA: Diagnosis not present

## 2018-02-10 DIAGNOSIS — B309 Viral conjunctivitis, unspecified: Secondary | ICD-10-CM | POA: Diagnosis not present

## 2018-02-14 NOTE — Progress Notes (Signed)
Cardiology Office Note   Date:  02/17/2018   ID:  [name] [name], DOB September 23, 1961, MRN [id number]  PCP:  Maurice Small, MD  Cardiologist:   Minus Breeding, MD   No chief complaint on file.     History of Present Illness: [name] [name] is a 57 y.o. female who presents for follow up of cardiomyopathy.   She has had a history of left bundle branch block.  In 2013 she did have an echo with well-preserved ejection fraction but some mild mitral regurgitation.  She was admitted in Sept 2018 with acute dyspnea and she had an echo which demonstrated an EF of 40%.  Of note the patient was being treated with Simponi Aria (golimumab).  We had suggested right and left heart cath but because of cost she chose not to have this.  Of note a follow-up echocardiogram did demonstrate that the ejection fraction had improved to 55-60%.  She says she is breathing better.  She denies any acute symptoms such as shortness of breath, PND or orthopnea.  She is had no new palpitations, presyncope or syncope.  She denies any chest pressure, neck or arm discomfort.  She is not doing much in way of physical activity.   Past Medical History:  Diagnosis Date  . Asthma   . Hypertension   . Lumbar herniated disc   . Pre-diabetes   . Rheumatoid arthritis ([place])   . Sleep apnea    CPAP    Past Surgical History:  Procedure Laterality Date  . BREAST SURGERY     breast reduction   . LAPAROSCOPIC GASTRIC SLEEVE RESECTION N/A 09/29/2016   Procedure: LAPAROSCOPIC GASTRIC SLEEVE RESECTION, UPPER ENDO;  Surgeon: Greer Pickerel, MD;  Location: [facility];  Service: General;  Laterality: N/A;  . REDUCTION MAMMAPLASTY Bilateral 2003  . removed bone from left knee     . UPPER GI ENDOSCOPY  09/29/2016   Procedure: UPPER GI ENDOSCOPY;  Surgeon: Greer Pickerel, MD;  Location: [facility];  Service: General;;     Current Outpatient Medications  Medication Sig Dispense Refill  . albuterol (PROVENTIL HFA;VENTOLIN HFA) 108 (90 Base)  MCG/ACT inhaler Inhale into the lungs every 6 (six) hours as needed for wheezing or shortness of breath.    . beclomethasone (QVAR) 80 MCG/ACT inhaler Inhale 2 puffs into the lungs 2 (two) times daily as needed (wheezing).    . Calcium 500-100 MG-UNIT CHEW Chew 1 tablet by mouth 3 (three) times daily.    . carvedilol (COREG) 12.5 MG tablet Take 1 tablet (12.5 mg total) by mouth 2 (two) times daily with a meal. 180 tablet 3  . fluticasone (FLONASE) 50 MCG/ACT nasal spray INHALE 1 SPRAY INTO EACH NOSTRIL ONCE DAILY  11  . furosemide (LASIX) 40 MG tablet TAKE 1 TABLET BY MOUTH EVERY DAY 30 tablet 10  . lisinopril (PRINIVIL,ZESTRIL) 10 MG tablet TAKE 1 TABLET BY MOUTH DAILY 30 tablet 5  . methotrexate 50 MG/2ML injection INJ 0.6 ML UTD Q WEEK  1  . Multiple Vitamin (MULTIVITAMIN WITH MINERALS) TABS tablet Take 1 tablet by mouth 2 (two) times daily.    . valACYclovir (VALTREX) 500 MG tablet TAKE 1 TABLET BY MOUTH EVERY DAY (Patient taking differently: TAKE 1 TABLET BY MOUTH EVERY DAY AS NEEDED FOR FLARE UP) 30 tablet 0   No current facility-administered medications for this visit.     Allergies:   Penicillins; Amoxicillin; and Tdap [tetanus-diphth-acell pertussis]    ROS:  Please see the history of  present illness.   Otherwise, review of systems are positive for none.   All other systems are reviewed and negative.    PHYSICAL EXAM: VS:  BP 128/75   Pulse 60   Ht 5\' 3"  (1.6 m)   Wt 266 lb (120.7 kg)   BMI 47.12 kg/m  , BMI Body mass index is 47.12 kg/m.  GENERAL:  Well appearing NECK:  No jugular venous distention, waveform within normal limits, carotid upstroke brisk and symmetric, no bruits, no thyromegaly LUNGS:  Clear to auscultation bilaterally CHEST:  Unremarkable HEART:  PMI not displaced or sustained,S1 and S2 within normal limits, no S3, no S4, no clicks, no rubs, no murmurs ABD:  Flat, positive bowel sounds normal in frequency in pitch, no bruits, no rebound, no guarding, no  midline pulsatile mass, no hepatomegaly, no splenomegaly EXT:  2 plus pulses throughout, no edema, no cyanosis no clubbing   EKG:  EKG is not ordered today.    Recent Labs: 08/09/2017: B Natriuretic Peptide 414.5; Hemoglobin 12.8; Platelets 246 08/11/2017: Magnesium 1.5 01/11/2018: BUN 15; Creatinine, Ser 0.57; Potassium 4.2; Sodium 143    Lipid Panel No results found for: CHOL, TRIG, HDL, CHOLHDL, VLDL, LDLCALC, LDLDIRECT    Wt Readings from Last 3 Encounters:  02/17/18 266 lb (120.7 kg)  01/11/18 263 lb 3.2 oz (119.4 kg)  12/10/17 261 lb 6.4 oz (118.6 kg)      Other studies Reviewed: Additional studies/ records that were reviewed today include: echo Review of the above records demonstrates:  See elsewhere.  ASSESSMENT AND PLAN:   ACUTE SYSTOLIC HF:  This could have been related to the golimumab.  EF was improved.  I will increase the lisinopril to 20 mg daily.  She will come back and get a basic metabolic profile in 2 weeks.  No further testing is indicated now that her ejection fraction is improved.    PULMONARY HTN:   This was mild on the last echo done earlier this month.  No change in therapy other than above.   LBBB:   This is chronic.    MR: This was trivial.  No change in therapy or follow-up is indicated.  SLEEP APNEA:   She uses this four at night.    She will continue to try.   HTN:   This is being managed in the context of treating his CHF   Current medicines are reviewed at length with the patient today.  The patient does not have concerns regarding medicines.  The following changes have been made:  As above  Labs/ tests ordered today include:  None  No orders of the defined types were placed in this encounter.   Disposition:   FU with Kerin Ransom in one month.     Signed, Minus Breeding, MD  02/17/2018 8:06 AM    [place]

## 2018-02-17 ENCOUNTER — Ambulatory Visit: Payer: 59 | Admitting: Cardiology

## 2018-02-17 ENCOUNTER — Encounter: Payer: Self-pay | Admitting: Cardiology

## 2018-02-17 VITALS — BP 128/75 | HR 60 | Ht 63.0 in | Wt 266.0 lb

## 2018-02-17 DIAGNOSIS — I428 Other cardiomyopathies: Secondary | ICD-10-CM | POA: Diagnosis not present

## 2018-02-17 DIAGNOSIS — I10 Essential (primary) hypertension: Secondary | ICD-10-CM

## 2018-02-17 DIAGNOSIS — Z6841 Body Mass Index (BMI) 40.0 and over, adult: Secondary | ICD-10-CM

## 2018-02-17 DIAGNOSIS — Z79899 Other long term (current) drug therapy: Secondary | ICD-10-CM | POA: Diagnosis not present

## 2018-02-17 DIAGNOSIS — G4733 Obstructive sleep apnea (adult) (pediatric): Secondary | ICD-10-CM

## 2018-02-17 MED ORDER — LISINOPRIL 20 MG PO TABS: 20.0000 mg | ORAL_TABLET | Freq: Every day | ORAL | 3 refills | Status: DC

## 2018-02-17 NOTE — Patient Instructions (Signed)
Medication Instructions:  INCREASE- Lisinopril 20 mg daily  If you need a refill on your cardiac medications before your next appointment, please call your pharmacy.  Labwork: BMP in 2 Weeks HERE IN OUR OFFICE AT [facility]  Take the provided lab slips for you to take with you to the lab for you blood draw.   You will NOT need to fast   Testing/Procedures: None Ordered  Follow-Up: Your physician wants you to follow-up in: 6 Months. You should receive a reminder letter in the mail two months in advance. If you do not receive a letter, please call our office [phone].    Thank you for choosing [facility] at [facility]!!

## 2018-02-19 ENCOUNTER — Telehealth: Payer: Self-pay | Admitting: Cardiology

## 2018-02-19 NOTE — Telephone Encounter (Signed)
New Message    1) Are you calling to confirm a diagnosis or obtain personal health information (Y/N)? y   2) If so, what information is requested? Stacy with [employer] is calling to obtain EF information please call to discuss. A detailed message can be left.  Please route to Medical Records or your medical records site representative

## 2018-02-21 DIAGNOSIS — G4733 Obstructive sleep apnea (adult) (pediatric): Secondary | ICD-10-CM | POA: Diagnosis not present

## 2018-02-25 ENCOUNTER — Telehealth: Payer: Self-pay | Admitting: Cardiology

## 2018-02-25 NOTE — Telephone Encounter (Signed)
Returned call to patient of Dr. Percival Spanish who c/o of tingling of hands, thumbs. This has been going on for 4-5 days. She has had no recent medication changes. She thinks her potassium may be low. She has no other complaints. She has an upcoming physical with her PCP and will have BMET next week ordered by Dr. Percival Spanish.   Routed to [facility] for med review/side effects

## 2018-02-25 NOTE — Telephone Encounter (Signed)
Patient aware of recommendations.  

## 2018-02-25 NOTE — Telephone Encounter (Signed)
New Message:    Both says she is having tingling in both hands,especially in her thumbs. She wonder if it might be a side effect from the medicine.

## 2018-02-25 NOTE — Telephone Encounter (Signed)
Tingling/numbness is not an expected ADR for any of the current medication. Will need BMET to determine if potassium is low.  Please complete BMET as previously ordered by Dr Peterson Ao . Contact PCP if tingling continues or worsen.

## 2018-02-26 DIAGNOSIS — R7303 Prediabetes: Secondary | ICD-10-CM | POA: Diagnosis not present

## 2018-02-26 DIAGNOSIS — I10 Essential (primary) hypertension: Secondary | ICD-10-CM | POA: Diagnosis not present

## 2018-02-26 DIAGNOSIS — Z0000 Encounter for general adult medical examination without abnormal findings: Secondary | ICD-10-CM | POA: Diagnosis not present

## 2018-02-26 DIAGNOSIS — E785 Hyperlipidemia, unspecified: Secondary | ICD-10-CM | POA: Diagnosis not present

## 2018-03-03 DIAGNOSIS — Z79899 Other long term (current) drug therapy: Secondary | ICD-10-CM | POA: Diagnosis not present

## 2018-03-03 LAB — BASIC METABOLIC PANEL
BUN / CREAT RATIO: 29 — AB (ref 9–23)
BUN: 17 mg/dL (ref 6–24)
CO2: 29 mmol/L (ref 20–29)
Calcium: 9.5 mg/dL (ref 8.7–10.2)
Chloride: 102 mmol/L (ref 96–106)
Creatinine, Ser: 0.58 mg/dL (ref 0.57–1.00)
GFR calc non Af Amer: 101 mL/min/{1.73_m2} (ref >59)
GFR, EST AFRICAN AMERICAN: 117 mL/min/{1.73_m2} (ref >59)
Glucose: 94 mg/dL (ref 65–99)
POTASSIUM: 4.6 mmol/L (ref 3.5–5.2)
Sodium: 142 mmol/L (ref 134–144)

## 2018-03-08 ENCOUNTER — Ambulatory Visit
Admission: RE | Admit: 2018-03-08 | Discharge: 2018-03-08 | Disposition: A | Discharge status: Home / Self Care | Payer: 59 | Source: Ambulatory Visit | Attending: Family Medicine | Admitting: Family Medicine

## 2018-03-08 DIAGNOSIS — Z1231 Encounter for screening mammogram for malignant neoplasm of breast: Secondary | ICD-10-CM

## 2018-03-13 ENCOUNTER — Encounter (HOSPITAL_COMMUNITY): Payer: Self-pay | Admitting: *Deleted

## 2018-03-13 ENCOUNTER — Emergency Department (HOSPITAL_COMMUNITY)
Admission: EM | Admit: 2018-03-13 | Discharge: 2018-03-13 | Disposition: A | Discharge status: Home / Self Care | Payer: 59 | Attending: Emergency Medicine | Admitting: Emergency Medicine

## 2018-03-13 ENCOUNTER — Other Ambulatory Visit: Payer: Self-pay

## 2018-03-13 DIAGNOSIS — R200 Anesthesia of skin: Secondary | ICD-10-CM | POA: Diagnosis not present

## 2018-03-13 DIAGNOSIS — M62838 Other muscle spasm: Secondary | ICD-10-CM | POA: Insufficient documentation

## 2018-03-13 DIAGNOSIS — M542 Cervicalgia: Secondary | ICD-10-CM | POA: Diagnosis not present

## 2018-03-13 DIAGNOSIS — Z5321 Procedure and treatment not carried out due to patient leaving prior to being seen by health care provider: Secondary | ICD-10-CM | POA: Diagnosis not present

## 2018-03-13 DIAGNOSIS — R0789 Other chest pain: Secondary | ICD-10-CM | POA: Diagnosis not present

## 2018-03-13 NOTE — ED Triage Notes (Signed)
Approx 3 weeks of tingling in bil arms and fingers, now has muscle spasm in neck.

## 2018-03-13 NOTE — ED Notes (Signed)
Pt told registation she was leaving and turned in her labels.

## 2018-03-23 DIAGNOSIS — G4733 Obstructive sleep apnea (adult) (pediatric): Secondary | ICD-10-CM | POA: Diagnosis not present

## 2018-03-30 DIAGNOSIS — M542 Cervicalgia: Secondary | ICD-10-CM | POA: Diagnosis not present

## 2018-03-31 DIAGNOSIS — S134XXD Sprain of ligaments of cervical spine, subsequent encounter: Secondary | ICD-10-CM | POA: Diagnosis not present

## 2018-03-31 DIAGNOSIS — M542 Cervicalgia: Secondary | ICD-10-CM | POA: Diagnosis not present

## 2018-03-31 DIAGNOSIS — M5412 Radiculopathy, cervical region: Secondary | ICD-10-CM | POA: Diagnosis not present

## 2018-04-05 DIAGNOSIS — R7303 Prediabetes: Secondary | ICD-10-CM | POA: Diagnosis not present

## 2018-04-05 DIAGNOSIS — E559 Vitamin D deficiency, unspecified: Secondary | ICD-10-CM | POA: Diagnosis not present

## 2018-04-05 DIAGNOSIS — K912 Postsurgical malabsorption, not elsewhere classified: Secondary | ICD-10-CM | POA: Diagnosis not present

## 2018-04-07 DIAGNOSIS — Z8679 Personal history of other diseases of the circulatory system: Secondary | ICD-10-CM | POA: Diagnosis not present

## 2018-04-07 DIAGNOSIS — G4733 Obstructive sleep apnea (adult) (pediatric): Secondary | ICD-10-CM | POA: Diagnosis not present

## 2018-04-07 DIAGNOSIS — Z9884 Bariatric surgery status: Secondary | ICD-10-CM | POA: Diagnosis not present

## 2018-04-13 DIAGNOSIS — L988 Other specified disorders of the skin and subcutaneous tissue: Secondary | ICD-10-CM | POA: Diagnosis not present

## 2018-04-23 DIAGNOSIS — G4733 Obstructive sleep apnea (adult) (pediatric): Secondary | ICD-10-CM | POA: Diagnosis not present

## 2018-05-03 DIAGNOSIS — L988 Other specified disorders of the skin and subcutaneous tissue: Secondary | ICD-10-CM | POA: Diagnosis not present

## 2018-05-05 ENCOUNTER — Encounter (HOSPITAL_COMMUNITY): Payer: Self-pay | Admitting: Emergency Medicine

## 2018-05-05 ENCOUNTER — Other Ambulatory Visit: Payer: Self-pay

## 2018-05-05 ENCOUNTER — Ambulatory Visit (HOSPITAL_COMMUNITY)
Admission: EM | Admit: 2018-05-05 | Discharge: 2018-05-05 | Disposition: A | Discharge status: Home / Self Care | Payer: 59 | Attending: Family Medicine | Admitting: Family Medicine

## 2018-05-05 DIAGNOSIS — L0291 Cutaneous abscess, unspecified: Secondary | ICD-10-CM | POA: Diagnosis not present

## 2018-05-05 MED ORDER — SULFAMETHOXAZOLE-TRIMETHOPRIM 800-160 MG PO TABS: 1.0000 | ORAL_TABLET | Freq: Two times a day (BID) | ORAL | 0 refills | Status: AC

## 2018-05-05 NOTE — ED Notes (Signed)
Bed: UC01 Expected date:  Expected time:  Means of arrival:  Comments: Hold For Appointments

## 2018-05-05 NOTE — ED Triage Notes (Signed)
Boil on neck.  Onset one week ago.  History of the same.  Requesting site be drained.

## 2018-05-05 NOTE — ED Provider Notes (Signed)
[facility]   [id number] 05/05/18 Arrival Time: 2536  ASSESSMENT & PLAN:  1. Abscess     Meds ordered this encounter  Medications  . sulfamethoxazole-trimethoprim (BACTRIM DS,SEPTRA DS) 800-160 MG tablet    Sig: Take 1 tablet by mouth 2 (two) times daily for 10 days.    Dispense:  20 tablet    Refill:  0    Procedure: Verbal consent obtained. Area over induration cleaned with betadine. Lidocaine 2% with epinephrine used to obtain local anesthesia (approx 2cc). The most fluctuant portion of the abscess was incised with a #11 blade scalpel. Purulent drainage. Abscess cavity explored and evacuated. Loculations broken up with a curved hemostat as best as possible given patient discomfort. Cavity too small for packing. Minimal bleeding. No complications.  Wound care instructions discussed and given in written format. To return in 48 hours for wound check.  Finish all antibiotics. OTC analgesics as needed.  Reviewed expectations re: course of current medical issues. Questions answered. Outlined signs and symptoms indicating need for more acute intervention. Patient verbalized understanding. After Visit Summary given.   SUBJECTIVE:  [name] [name] is a 57 y.o. female who presents with a possible abscess of her R posterior neck. Onset gradual, approximately 4 days ago.  Tender. Without drainage. Afebrile. Home hot compresses without help.  ROS: As per HPI.  OBJECTIVE:  Vitals:   05/05/18 1029  BP: 126/81  Pulse: 85  Resp: 20  Temp: 98.3 F (36.8 C)  TempSrc: Oral  SpO2: 97%     General appearance: alert; no distress Skin: 1.5 cm induration of her R posterior neck; tender to touch; no active drainage Psychological: alert and cooperative; normal mood and affect  Allergies  Allergen Reactions  . Penicillins Anaphylaxis, Hives and Shortness Of Breath    Has patient had a PCN reaction causing immediate rash, facial/tongue/throat swelling, SOB or  lightheadedness with hypotension: yes Has patient had a PCN reaction causing severe rash involving mucus membranes or skin necrosis: yes Has patient had a PCN reaction that required hospitalization no Has patient had a PCN reaction occurring within the last 10 years: yes If all of the above answers are "NO", then may proceed with Cephalosporin use.  Marland Kitchen Amoxicillin Other (See Comments)    Upset stomach  . Tdap [Tetanus-Diphth-Acell Pertussis] Other (See Comments)    Headache /dehydration    Past Medical History:  Diagnosis Date  . Asthma   . Hypertension   . Lumbar herniated disc   . Pre-diabetes   . Rheumatoid arthritis ([place])   . Sleep apnea    CPAP   Social History   Socioeconomic History  . Marital status: Married    Spouse name: Not on file  . Number of children: Not on file  . Years of education: Not on file  . Highest education level: Not on file  Occupational History  . Occupation: Therapist, art  Social Needs  . Financial resource strain: Not on file  . Food insecurity:    Worry: Never true    Inability: Never true  . Transportation needs:    Medical: Not on file    Non-medical: Not on file  Tobacco Use  . Smoking status: Never Smoker  . Smokeless tobacco: Never Used  Substance and Sexual Activity  . Alcohol use: No    Alcohol/week: 0.0 oz  . Drug use: No  . Sexual activity: Yes    Birth control/protection: None  Lifestyle  . Physical activity:    Days per  week: Not on file    Minutes per session: Not on file  . Stress: Not on file  Relationships  . Social connections:    Talks on phone: Not on file    Gets together: Not on file    Attends religious service: Not on file    Active member of club or organization: Not on file    Attends meetings of clubs or organizations: Not on file    Relationship status: Not on file  Other Topics Concern  . Not on file  Social History Narrative  . Not on file   Family History  Problem Relation Age of Onset    . Ovarian cancer Mother 7       Not sure of this diagnosis  . Diabetes Brother   . Hypertension Brother   . Breast cancer Neg Hx    Past Surgical History:  Procedure Laterality Date  . BREAST SURGERY     breast reduction   . LAPAROSCOPIC GASTRIC SLEEVE RESECTION N/A 09/29/2016   Procedure: LAPAROSCOPIC GASTRIC SLEEVE RESECTION, UPPER ENDO;  Surgeon: Greer Pickerel, MD;  Location: [facility];  Service: General;  Laterality: N/A;  . REDUCTION MAMMAPLASTY Bilateral 2003  . removed bone from left knee     . UPPER GI ENDOSCOPY  09/29/2016   Procedure: UPPER GI ENDOSCOPY;  Surgeon: Greer Pickerel, MD;  Location: [facility];  Service: Efrain Sella, MD 05/05/18 1056

## 2018-05-23 DIAGNOSIS — G4733 Obstructive sleep apnea (adult) (pediatric): Secondary | ICD-10-CM | POA: Diagnosis not present

## 2018-06-17 DIAGNOSIS — G4733 Obstructive sleep apnea (adult) (pediatric): Secondary | ICD-10-CM | POA: Diagnosis not present

## 2018-06-29 DIAGNOSIS — L720 Epidermal cyst: Secondary | ICD-10-CM | POA: Diagnosis not present

## 2018-07-09 DIAGNOSIS — H524 Presbyopia: Secondary | ICD-10-CM | POA: Diagnosis not present

## 2018-07-30 ENCOUNTER — Telehealth: Payer: Self-pay | Admitting: Cardiology

## 2018-07-30 MED ORDER — FUROSEMIDE 40 MG PO TABS: 40.0000 mg | ORAL_TABLET | Freq: Every day | ORAL | 2 refills | Status: DC

## 2018-07-30 NOTE — Telephone Encounter (Signed)
New Message     *STAT* If patient is at the pharmacy, call can be transferred to refill team.   1. Which medications need to be refilled? (please list name of each medication and dose if known) furosemide (LASIX) 40 MG tablet  2. Which pharmacy/location (including street and city if local pharmacy) is medication to be sent to? [place], [place] - [address] AT [place]  3. Do they need a 30 day or 90 day supply? 30 days

## 2018-08-26 DIAGNOSIS — M9903 Segmental and somatic dysfunction of lumbar region: Secondary | ICD-10-CM | POA: Diagnosis not present

## 2018-08-26 DIAGNOSIS — M9901 Segmental and somatic dysfunction of cervical region: Secondary | ICD-10-CM | POA: Diagnosis not present

## 2018-08-26 DIAGNOSIS — M5412 Radiculopathy, cervical region: Secondary | ICD-10-CM | POA: Diagnosis not present

## 2018-09-02 DIAGNOSIS — M9903 Segmental and somatic dysfunction of lumbar region: Secondary | ICD-10-CM | POA: Diagnosis not present

## 2018-09-02 DIAGNOSIS — M5412 Radiculopathy, cervical region: Secondary | ICD-10-CM | POA: Diagnosis not present

## 2018-09-02 DIAGNOSIS — M9901 Segmental and somatic dysfunction of cervical region: Secondary | ICD-10-CM | POA: Diagnosis not present

## 2018-09-12 NOTE — Progress Notes (Signed)
Cardiology Office Note   Date:  09/13/2018   ID:  [name] [name], DOB 10/20/1961, MRN [id number]  PCP:  Maurice Small, MD  Cardiologist:   Minus Breeding, MD   Chief Complaint  Patient presents with  . Cardiomyopathy      History of Present Illness: [name] [name] is a 57 y.o. female who presents for follow up of cardiomyopathy.   She has had a history of left bundle branch block.  In 2013 she did have an echo with well-preserved ejection fraction but some mild mitral regurgitation.  She was admitted in Sept 2018 with acute dyspnea and she had an echo which demonstrated an EF of 40%.  Of note the patient was being treated with Simponi Aria (golimumab).  We had suggested right and left heart cath but because of cost she chose not to have this.  Of note a follow-up echocardiogram did demonstrate that the ejection fraction had improved to 55-60%.  She returns for follow up. At the last visit lisinopril was increased.    She has been walking about a mile 3 times per week which is an increase from previous.  She has gained some weight but she has been eating more starch.  She denies any new shortness of breath, PND or orthopnea.  She has no palpitations, presyncope or syncope.  She is not having any chest pressure, neck or arm discomfort.  She is had no new edema.   Past Medical History:  Diagnosis Date  . Asthma   . Hypertension   . Lumbar herniated disc   . Pre-diabetes   . Rheumatoid arthritis ([place])   . Sleep apnea    CPAP    Past Surgical History:  Procedure Laterality Date  . BREAST SURGERY     breast reduction   . LAPAROSCOPIC GASTRIC SLEEVE RESECTION N/A 09/29/2016   Procedure: LAPAROSCOPIC GASTRIC SLEEVE RESECTION, UPPER ENDO;  Surgeon: Greer Pickerel, MD;  Location[facility];  Service: General;  Laterality: N/A;  . REDUCTION MAMMAPLASTY Bilateral 2003  . removed bone from left knee     . UPPER GI ENDOSCOPY  09/29/2016   Procedure: UPPER GI ENDOSCOPY;  Surgeon:  Greer Pickerel, MD;  Location: [facility];  Service: General;;     Current Outpatient Medications  Medication Sig Dispense Refill  . carvedilol (COREG) 12.5 MG tablet Take 1 tablet (12.5 mg total) by mouth 2 (two) times daily with a meal. 180 tablet 3  . furosemide (LASIX) 40 MG tablet Take 1 tablet (40 mg total) by mouth daily. KEEP OV. 30 tablet 2  . lisinopril (PRINIVIL,ZESTRIL) 20 MG tablet Take 1 tablet (20 mg total) by mouth daily. 90 tablet 3  . valACYclovir (VALTREX) 500 MG tablet TAKE 1 TABLET BY MOUTH EVERY DAY (Patient taking differently: TAKE 1 TABLET BY MOUTH EVERY DAY AS NEEDED FOR FLARE UP) 30 tablet 0   No current facility-administered medications for this visit.     Allergies:   Penicillins; Amoxicillin; and Tdap [tetanus-diphth-acell pertussis]    ROS:  Please see the history of present illness.   Otherwise, review of systems are positive for none.   All other systems are reviewed and negative.    PHYSICAL EXAM: VS:  BP 118/70   Pulse (!) 58   Ht 5\' 3"  (1.6 m)   Wt 281 lb 3.2 oz (127.6 kg)   BMI 49.81 kg/m  , BMI Body mass index is 49.81 kg/m.  GENERAL:  Well appearing NECK:  No jugular venous distention,  waveform within normal limits, carotid upstroke brisk and symmetric, no bruits, no thyromegaly LUNGS:  Clear to auscultation bilaterally CHEST:  Unremarkable HEART:  PMI not displaced or sustained,S1 and S2 within normal limits, no S3, no S4, no clicks, no rubs, no murmurs ABD:  Flat, positive bowel sounds normal in frequency in pitch, no bruits, no rebound, no guarding, no midline pulsatile mass, no hepatomegaly, no splenomegaly EXT:  2 plus pulses throughout, no edema, no cyanosis no clubbing   EKG:  EKG is ordered today. Sinus rhythm, rate 58, axis within normal limits, left bundle branch block, no acute ST-T wave changes.   Recent Labs: 03/03/2018: BUN 17; Creatinine, Ser 0.58; Potassium 4.6; Sodium 142    Lipid Panel No results found for: CHOL, TRIG, HDL,  CHOLHDL, VLDL, LDLCALC, LDLDIRECT    Wt Readings from Last 3 Encounters:  09/13/18 281 lb 3.2 oz (127.6 kg)  03/13/18 263 lb (119.3 kg)  02/17/18 266 lb (120.7 kg)      Other studies Reviewed: Additional studies/ records that were reviewed today include: Labs Review of the above records demonstrates:    ASSESSMENT AND PLAN:   ACUTE SYSTOLIC HF:  This could have been related to the golimumab.  EF was improved.  No change in therapy.  She seems to be euvolemic.  PULMONARY HTN:    This was very mild.  She should continue with weight loss and management of other risk factors.  This can be followed clinically.   LBBB:   This is chronic.  No change in therapy.   MR: This was trivial.  No change in therapy.   SLEEP APNEA:    She her CPAP regularly.  No change in therapy.   HTN:   The blood pressure is at target. No change in medications is indicated. We will continue with therapeutic lifestyle changes (TLC).  MORBID OBESITY:  We again talked about diet and exercise strategies.  She has seen a nutritionist in the past.   Current medicines are reviewed at length with the patient today.  The patient does not have concerns regarding medicines.  The following changes have been made:  None  Labs/ tests ordered today include:  None  Orders Placed This Encounter  Procedures  . EKG 12-Lead     Disposition:   FU with me as needed   Signed, Minus Breeding, MD  09/13/2018 9:38 AM    [place]

## 2018-09-13 ENCOUNTER — Encounter: Payer: Self-pay | Admitting: Cardiology

## 2018-09-13 ENCOUNTER — Ambulatory Visit: Payer: 59 | Admitting: Cardiology

## 2018-09-13 VITALS — BP 118/70 | HR 58 | Ht 63.0 in | Wt 281.2 lb

## 2018-09-13 DIAGNOSIS — I272 Pulmonary hypertension, unspecified: Secondary | ICD-10-CM | POA: Diagnosis not present

## 2018-09-13 DIAGNOSIS — I447 Left bundle-branch block, unspecified: Secondary | ICD-10-CM | POA: Diagnosis not present

## 2018-09-13 DIAGNOSIS — I10 Essential (primary) hypertension: Secondary | ICD-10-CM | POA: Diagnosis not present

## 2018-09-13 DIAGNOSIS — I429 Cardiomyopathy, unspecified: Secondary | ICD-10-CM

## 2018-09-13 NOTE — Patient Instructions (Signed)

## 2018-10-11 DIAGNOSIS — Z1211 Encounter for screening for malignant neoplasm of colon: Secondary | ICD-10-CM | POA: Diagnosis not present

## 2018-10-11 DIAGNOSIS — L0231 Cutaneous abscess of buttock: Secondary | ICD-10-CM | POA: Diagnosis not present

## 2018-10-11 DIAGNOSIS — L03317 Cellulitis of buttock: Secondary | ICD-10-CM | POA: Diagnosis not present

## 2018-10-12 ENCOUNTER — Ambulatory Visit
Admission: RE | Admit: 2018-10-12 | Discharge: 2018-10-12 | Disposition: A | Discharge status: Home / Self Care | Payer: 59 | Source: Ambulatory Visit | Attending: Family Medicine | Admitting: Family Medicine

## 2018-10-12 ENCOUNTER — Other Ambulatory Visit: Payer: Self-pay | Admitting: Family Medicine

## 2018-10-12 DIAGNOSIS — L0231 Cutaneous abscess of buttock: Secondary | ICD-10-CM

## 2018-10-12 DIAGNOSIS — L03317 Cellulitis of buttock: Principal | ICD-10-CM

## 2018-10-12 DIAGNOSIS — Z5189 Encounter for other specified aftercare: Secondary | ICD-10-CM | POA: Diagnosis not present

## 2018-10-12 MED ORDER — IOPAMIDOL (ISOVUE-300) INJECTION 61%
125.0000 mL | Freq: Once | INTRAVENOUS | Status: AC | PRN
  Administered 2018-10-12: 125 mL via INTRAVENOUS

## 2018-10-18 ENCOUNTER — Other Ambulatory Visit: Payer: Self-pay | Admitting: Family Medicine

## 2018-10-18 DIAGNOSIS — D219 Benign neoplasm of connective and other soft tissue, unspecified: Secondary | ICD-10-CM

## 2018-10-25 ENCOUNTER — Ambulatory Visit
Admission: RE | Admit: 2018-10-25 | Discharge: 2018-10-25 | Disposition: A | Discharge status: Home / Self Care | Payer: 59 | Source: Ambulatory Visit | Attending: Family Medicine | Admitting: Family Medicine

## 2018-10-25 DIAGNOSIS — D219 Benign neoplasm of connective and other soft tissue, unspecified: Secondary | ICD-10-CM

## 2018-10-25 DIAGNOSIS — D259 Leiomyoma of uterus, unspecified: Secondary | ICD-10-CM | POA: Diagnosis not present

## 2018-10-25 NOTE — Progress Notes (Signed)
Cardiology Office Note   Date:  10/27/2018   ID:  [name] [name], DOB 06-Sep-1961, MRN [id number]  PCP:  Maurice Small, MD  Cardiologist:   Minus Breeding, MD   Chief Complaint  Patient presents with  . Chest Pain      History of Present Illness: [name] [name] is a 57 y.o. female who presents for follow up of cardiomyopathy.   She has had a history of left bundle branch block.  In 2013 she did have an echo with well-preserved ejection fraction but some mild mitral regurgitation.  She was admitted in Sept 2018 with acute dyspnea and she had an echo which demonstrated an EF of 40%.  Of note the patient was being treated with Simponi Aria (golimumab).  We had suggested right and left heart cath but because of cost she chose not to have this.  Of note a follow-up echocardiogram did demonstrate that the ejection fraction had improved to 55-60%.  She returns for follow up.   She called to be added to my schedule because she was having some left upper chest discomfort.  This is been going on for couple of weeks.  She denies any trauma did not recall anything that may have initiated this.  She not had this before.  Pain is a sharp aching discomfort.  Seems to be up in her shoulder and upper chest and exacerbated with movement and With Palpation. It was improved with Aleve.  There were no associated symptoms.    Past Medical History:  Diagnosis Date  . Asthma   . Hypertension   . Lumbar herniated disc   . Pre-diabetes   . Rheumatoid arthritis ([place])   . Sleep apnea    CPAP    Past Surgical History:  Procedure Laterality Date  . BREAST SURGERY     breast reduction   . LAPAROSCOPIC GASTRIC SLEEVE RESECTION N/A 09/29/2016   Procedure: LAPAROSCOPIC GASTRIC SLEEVE RESECTION, UPPER ENDO;  Surgeon: Greer Pickerel, MD;  Location: [facility];  Service: General;  Laterality: N/A;  . REDUCTION MAMMAPLASTY Bilateral 2003  . removed bone from left knee     . UPPER GI ENDOSCOPY  09/29/2016   Procedure: UPPER GI ENDOSCOPY;  Surgeon: Greer Pickerel, MD;  Location: [facility];  Service: General;;     Current Outpatient Medications  Medication Sig Dispense Refill  . carvedilol (COREG) 12.5 MG tablet Take 1 tablet (12.5 mg total) by mouth 2 (two) times daily with a meal. 180 tablet 3  . furosemide (LASIX) 40 MG tablet Take 1 tablet (40 mg total) by mouth daily. KEEP OV. 30 tablet 2  . lisinopril (PRINIVIL,ZESTRIL) 20 MG tablet Take 1 tablet (20 mg total) by mouth daily. 90 tablet 3  . valACYclovir (VALTREX) 500 MG tablet TAKE 1 TABLET BY MOUTH EVERY DAY (Patient taking differently: TAKE 1 TABLET BY MOUTH EVERY DAY AS NEEDED FOR FLARE UP) 30 tablet 0   No current facility-administered medications for this visit.     Allergies:   Penicillins; Amoxicillin; and Tdap [tetanus-diphth-acell pertussis]    ROS:  Please see the history of present illness.   Otherwise, review of systems are positive for none.   All other systems are reviewed and negative.    PHYSICAL EXAM: VS:  BP 118/70   Pulse 73   Ht 5\' 3"  (1.6 m)   Wt 272 lb (123.4 kg)   BMI 48.18 kg/m  , BMI Body mass index is 48.18 kg/m.  GENERAL:  Well appearing NECK:  No jugular venous distention, waveform within normal limits, carotid upstroke brisk and symmetric, no bruits, no thyromegaly LUNGS:  Clear to auscultation bilaterally CHEST:  Unremarkable HEART:  PMI not displaced or sustained,S1 and S2 within normal limits, no S3, no S4, no clicks, no rubs, no murmurs ABD:  Flat, positive bowel sounds normal in frequency in pitch, no bruits, no rebound, no guarding, no midline pulsatile mass, no hepatomegaly, no splenomegaly EXT:  2 plus pulses throughout, no edema, no cyanosis no clubbing      EKG:  EKG is  ordered today. Sinus rhythm, rate 73, axis within normal limits, left bundle branch block, no acute ST-T wave changes.   Recent Labs: 03/03/2018: BUN 17; Creatinine, Ser 0.58; Potassium 4.6; Sodium 142    Lipid  Panel No results found for: CHOL, TRIG, HDL, CHOLHDL, VLDL, LDLCALC, LDLDIRECT    Wt Readings from Last 3 Encounters:  10/27/18 272 lb (123.4 kg)  09/13/18 281 lb 3.2 oz (127.6 kg)  03/13/18 263 lb (119.3 kg)      Other studies Reviewed: Additional studies/ records that were reviewed today include: None Review of the above records demonstrates:    ASSESSMENT AND PLAN:   CHEST PAIN: This is atypical.  It seems to be musculoskeletal.  No further cardiac work-up is suggested.  ACUTE SYSTOLIC HF:   The EF improved.  I would not suspect that there was any reason that this would be lower.  I do not plan further imaging.   PULMONARY HTN:    This was very mild in the past.  No change in therapy.  LBBB:   This is chronic.    MR:   This was trivial.  No change in therapy.   SLEEP APNEA:    She uses her CPAP regularly.    HTN:   The blood pressure is at target.  No change in therapy.   MORBID OBESITY:     I have given her a referral to our [facility] to talk in particular about exercise.   Current medicines are reviewed at length with the patient today.  The patient does not have concerns regarding medicines.  The following changes have been made:  None  Labs/ tests ordered today include:  None  Orders Placed This Encounter  Procedures  . EKG 12-Lead     Disposition:   Follow up in one year.    Signed, Minus Breeding, MD  10/27/2018 8:43 AM    [facility]

## 2018-10-27 ENCOUNTER — Encounter: Payer: Self-pay | Admitting: Cardiology

## 2018-10-27 ENCOUNTER — Ambulatory Visit: Payer: 59 | Admitting: Cardiology

## 2018-10-27 VITALS — BP 118/70 | HR 73 | Ht 63.0 in | Wt 272.0 lb

## 2018-10-27 DIAGNOSIS — R079 Chest pain, unspecified: Secondary | ICD-10-CM | POA: Diagnosis not present

## 2018-10-27 NOTE — Patient Instructions (Signed)

## 2018-11-12 ENCOUNTER — Telehealth: Payer: Self-pay

## 2018-11-12 NOTE — Telephone Encounter (Signed)
Called to schedule care guide apt. Left vm.  

## 2018-12-01 DIAGNOSIS — L0293 Carbuncle, unspecified: Secondary | ICD-10-CM | POA: Diagnosis not present

## 2018-12-01 DIAGNOSIS — J0100 Acute maxillary sinusitis, unspecified: Secondary | ICD-10-CM | POA: Diagnosis not present

## 2018-12-06 ENCOUNTER — Ambulatory Visit: Payer: 59 | Admitting: Internal Medicine

## 2018-12-06 ENCOUNTER — Ambulatory Visit (INDEPENDENT_AMBULATORY_CARE_PROVIDER_SITE_OTHER)
Admission: RE | Admit: 2018-12-06 | Discharge: 2018-12-06 | Disposition: A | Discharge status: Home / Self Care | Payer: 59 | Source: Ambulatory Visit | Attending: Internal Medicine | Admitting: Internal Medicine

## 2018-12-06 ENCOUNTER — Encounter: Payer: Self-pay | Admitting: Internal Medicine

## 2018-12-06 VITALS — BP 120/78 | HR 96 | Ht 63.0 in | Wt 270.2 lb

## 2018-12-06 DIAGNOSIS — G4733 Obstructive sleep apnea (adult) (pediatric): Secondary | ICD-10-CM

## 2018-12-06 DIAGNOSIS — R05 Cough: Secondary | ICD-10-CM | POA: Diagnosis not present

## 2018-12-06 DIAGNOSIS — I420 Dilated cardiomyopathy: Secondary | ICD-10-CM | POA: Diagnosis not present

## 2018-12-06 DIAGNOSIS — R059 Cough, unspecified: Secondary | ICD-10-CM

## 2018-12-06 DIAGNOSIS — Z23 Encounter for immunization: Secondary | ICD-10-CM | POA: Diagnosis not present

## 2018-12-06 MED ORDER — OLMESARTAN MEDOXOMIL 20 MG PO TABS: 20.0000 mg | ORAL_TABLET | Freq: Every day | ORAL | 5 refills | Status: DC

## 2018-12-06 NOTE — Patient Instructions (Signed)
Lisinopril may be causing the cough you describe. Script sent to try generic Benicar instead of lisinopril for 1 month. Don't take both medicines. After a month, if the cough is not better, then go back to lisinopril.  Order- CXR   Dx cough  Order- flu vax standard  Please call if we can help  Get back to using your CPAP as soon as you can.

## 2018-12-06 NOTE — Progress Notes (Signed)
09/18/2016-58 year old female referred courtesy of Dr. Greer Pickerel for OSA, anticipating bariatric surgery for morbid obesity, Medical problems include Crohn's, HBP, LBBB Unattended Home Sleep Test 09/02/2016- AHI 16.0/hour, desaturation to 62%, body weight 297 pounds. CXR 07/02/2016-NAD Pulmonary clearance  for wt loss surgery scheduled for 09-29-16 per Dr Greer Pickerel. Will need CPAP in place for surgery Husband uses CPAP-she is used to the idea. History of loud snoring especially as her weight went above 250 pounds. Admits to daytime sleepiness, occasional nap. One cup of coffee daily. No ENT surgery and denies history of lung or heart disease. Quit smoking 25 years ago. Denies routine cough or wheeze. Office Spirometry 09/18/2016-mild restriction of exhaled volume-FVC 1.97/75%, FEV1 1.60/77%, FEV1/FVC 0.81, FEF 25-75 percent 1.75/83%.  12/05/2018-58 year old female never smoker followed for OSA, complicated by Crohn's disease, HBP, LBBB, history of bariatric surgery -----Pt last seen 2017 for per op clearance-states she is having hard time with chest discomfort x 2 months; cleared by heart dr that its not her heart-not resting well due to unable to breathe at night.  Pt is not wearing CPAP.  Body weight today 270 pounds (297 pounds in 2017) Nasal congestion lying down. Nocturnal cough- dry or scant clear x 2 months. Feels "twitch" sternal area, occ to back. Not short of breath. Pending gastric sleeve Bariatric surgery eval. Pain in buttock - has to sleep prone, and up every 2 hours. Interferes with CPAP.  CXR 08/09/2017- IMPRESSION: Mild cardiomegaly with vascular congestion and probable perihilar edema. Findings suggest mild fluid overload/CHF  ROS-see HPI   + = positive Constitutional:    weight loss, night sweats, fevers, chills, + fatigue, lassitude. HEENT:    headaches, difficulty swallowing, tooth/dental problems, sore throat,       sneezing, itching, ear ache, nasal congestion, post  nasal drip, snoring CV:    chest pain, orthopnea, PND, + swelling in lower extremities, anasarca,                                                      dizziness, palpitations Resp:  + shortness of breath with exertion or at rest.                productive cough,   non-productive cough, coughing up of blood.              change in color of mucus.  wheezing.   Skin:    rash or lesions. GI:  No-   heartburn, indigestion, abdominal pain, nausea, vomiting, diarrhea,                 change in bowel habits, loss of appetite GU: dysuria, change in color of urine, no urgency or frequency.   flank pain. MS:   joint pain, stiffness, decreased range of motion, +back pain. Neuro-     nothing unusual Psych:  change in mood or affect.  depression or anxiety.   memory loss.  OBJ- Physical Exam General- Alert, Oriented, Affect-appropriate, Distress- none acute, + morbidly obese Skin- rash-none, lesions- none, excoriation- none, cool/ clammy Lymphadenopathy- none Head- atraumatic            Eyes- Gross vision intact, PERRLA, conjunctivae and secretions clear            Ears- Hearing, canals-normal  Nose- Clear, no-Septal dev, mucus, polyps, erosion, perforation             Throat- Mallampati III-IV , mucosa clear , drainage- none, tonsils- atrophic Neck- flexible , trachea midline, no stridor , thyroid nl, carotid no bruit Chest - symmetrical excursion , unlabored           Heart/CV- RRR , no murmur , no gallop  , no rub, nl s1 s2                           - JVD- none , edema- none, stasis changes- none, varices- none           Lung- clear to P&A, wheeze- none, cough- none , dullness-none, rub- none           Chest wall-  Abd-  Br/ Gen/ Rectal- Not done, not indicated Extrem- cyanosis- none, clubbing, none, atrophy- none, strength- nl Neuro- grossly intact to observation

## 2018-12-15 DIAGNOSIS — G4733 Obstructive sleep apnea (adult) (pediatric): Secondary | ICD-10-CM | POA: Diagnosis not present

## 2018-12-17 DIAGNOSIS — G4733 Obstructive sleep apnea (adult) (pediatric): Secondary | ICD-10-CM | POA: Diagnosis not present

## 2018-12-17 DIAGNOSIS — R7303 Prediabetes: Secondary | ICD-10-CM | POA: Diagnosis not present

## 2018-12-20 DIAGNOSIS — S30817A Abrasion of anus, initial encounter: Secondary | ICD-10-CM | POA: Diagnosis not present

## 2018-12-28 ENCOUNTER — Telehealth: Payer: Self-pay

## 2018-12-28 NOTE — Telephone Encounter (Signed)
Called to follow up on care guide referral. No answer. Left vm

## 2019-01-27 ENCOUNTER — Other Ambulatory Visit: Payer: Self-pay | Admitting: Family Medicine

## 2019-01-27 DIAGNOSIS — Z1231 Encounter for screening mammogram for malignant neoplasm of breast: Secondary | ICD-10-CM

## 2019-02-08 NOTE — Assessment & Plan Note (Signed)
Not using CPAP. Hopefully she can get sternal and gluteal discomforts resolved, proceed with planned bariatric surgery, and get back on track as discussed.

## 2019-02-08 NOTE — Assessment & Plan Note (Signed)
She had failed original effort and is now considering gastric sleeve. This is important and I hope she is successful.

## 2019-02-08 NOTE — Assessment & Plan Note (Signed)
Consider if a component of her cough complaint might be from Lisinopril. Possibly change to ARB such as Benicar 20 for comparison trial. Long term change would need to be approved by cardiology or PCP.

## 2019-03-14 ENCOUNTER — Ambulatory Visit: Payer: Self-pay

## 2019-03-29 DIAGNOSIS — J321 Chronic frontal sinusitis: Secondary | ICD-10-CM | POA: Diagnosis not present

## 2019-03-29 DIAGNOSIS — I10 Essential (primary) hypertension: Secondary | ICD-10-CM | POA: Diagnosis not present

## 2019-05-06 ENCOUNTER — Other Ambulatory Visit: Payer: Self-pay | Admitting: Family Medicine

## 2019-05-06 DIAGNOSIS — L732 Hidradenitis suppurativa: Secondary | ICD-10-CM

## 2019-05-12 ENCOUNTER — Ambulatory Visit
Admission: RE | Admit: 2019-05-12 | Discharge: 2019-05-12 | Disposition: A | Discharge status: Home / Self Care | Payer: 59 | Source: Ambulatory Visit | Attending: Family Medicine | Admitting: Family Medicine

## 2019-05-12 ENCOUNTER — Other Ambulatory Visit: Payer: Self-pay

## 2019-05-12 ENCOUNTER — Other Ambulatory Visit: Payer: 59

## 2019-05-12 DIAGNOSIS — Z1231 Encounter for screening mammogram for malignant neoplasm of breast: Secondary | ICD-10-CM

## 2019-05-12 DIAGNOSIS — L732 Hidradenitis suppurativa: Secondary | ICD-10-CM

## 2019-05-12 MED ORDER — IOPAMIDOL (ISOVUE-300) INJECTION 61%
125.0000 mL | Freq: Once | INTRAVENOUS | Status: AC | PRN
  Administered 2019-05-12: 10:00:00 125 mL via INTRAVENOUS

## 2019-05-23 ENCOUNTER — Other Ambulatory Visit: Payer: 59

## 2019-08-03 IMAGING — MG DIGITAL SCREENING BILATERAL MAMMOGRAM WITH CAD
5 series · 5 of 5 positions shown · non-contrast
Comparison: Previous exam(s).

CLINICAL DATA: Screening. History of bilateral reduction
mammoplasty.

EXAM:
DIGITAL SCREENING BILATERAL MAMMOGRAM WITH CAD

[R MLO]
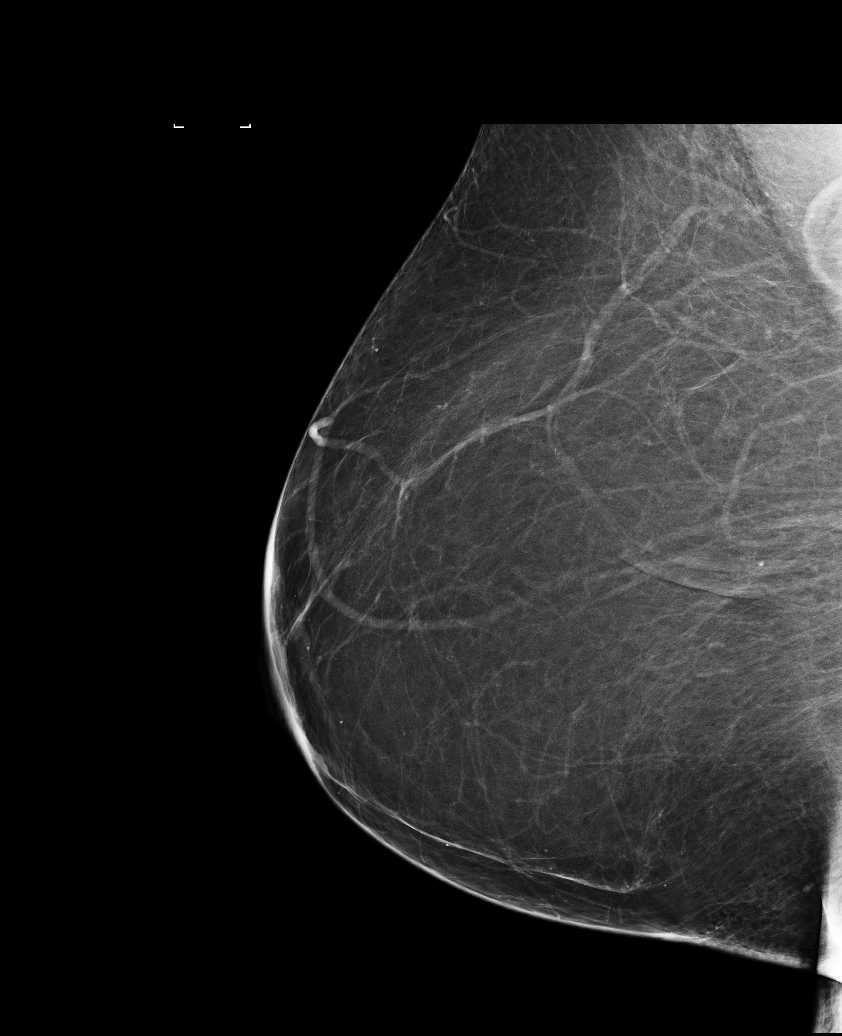

[L CC (1 of 2)]
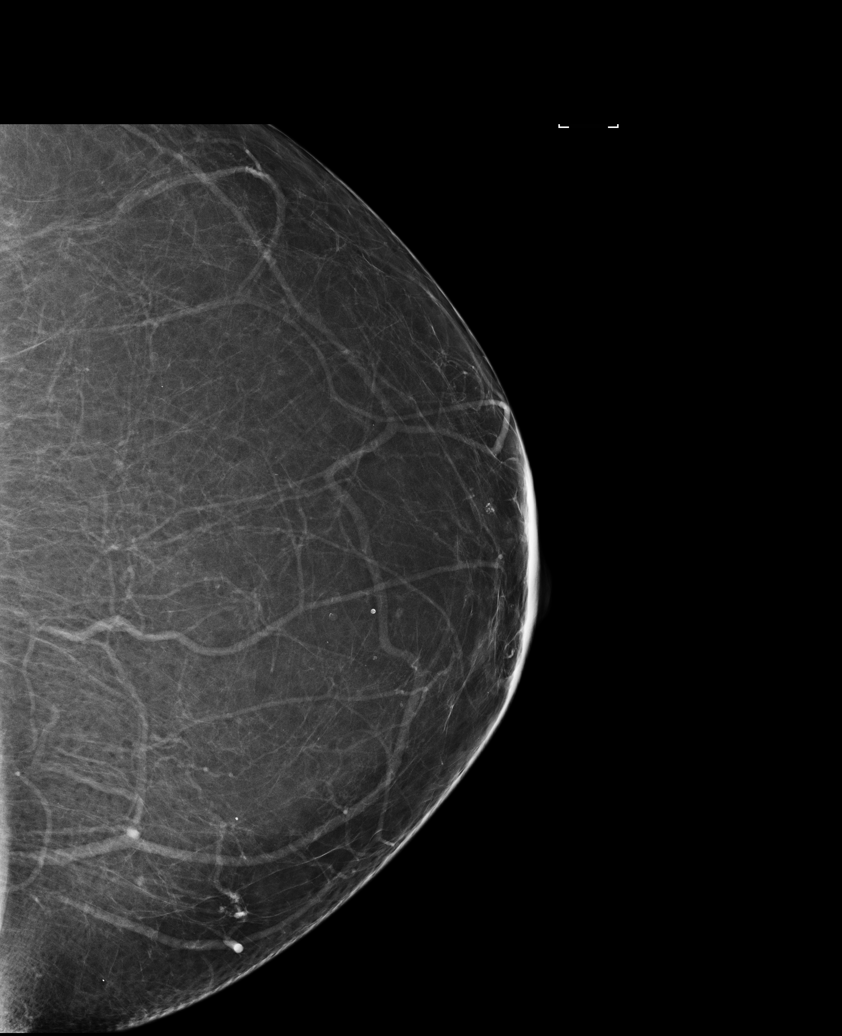

[L CC (2 of 2)]
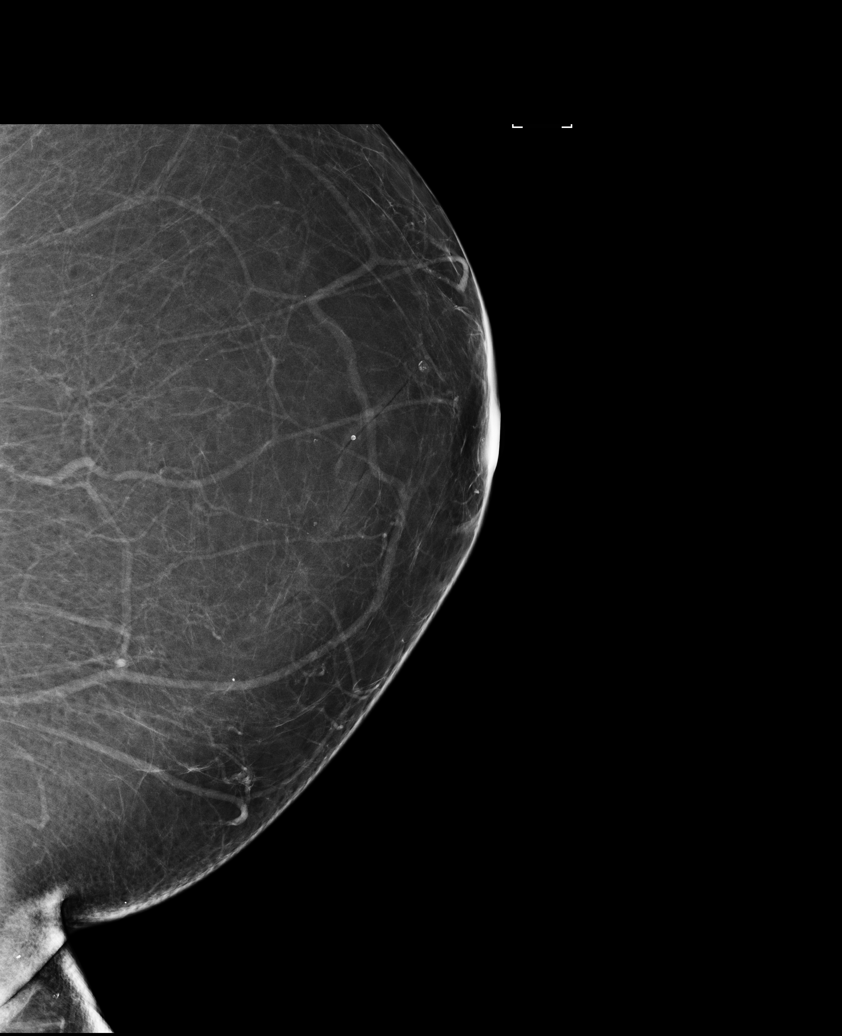

[R CC]
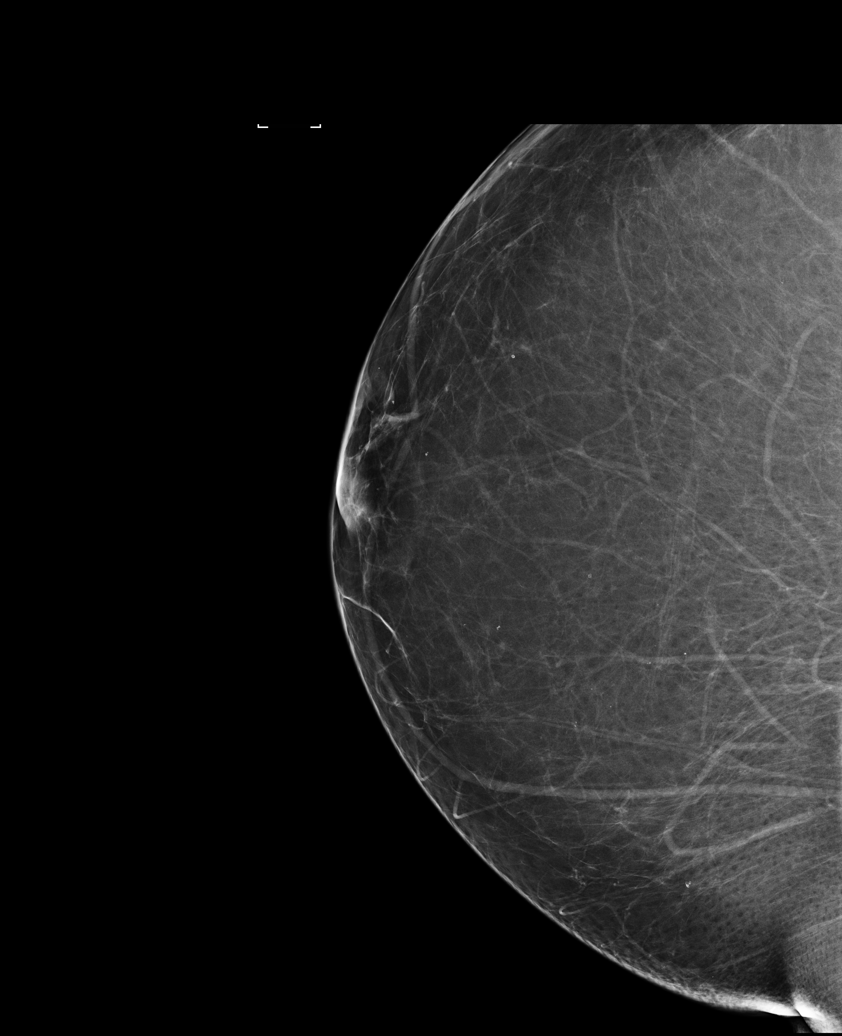

[L MLO]
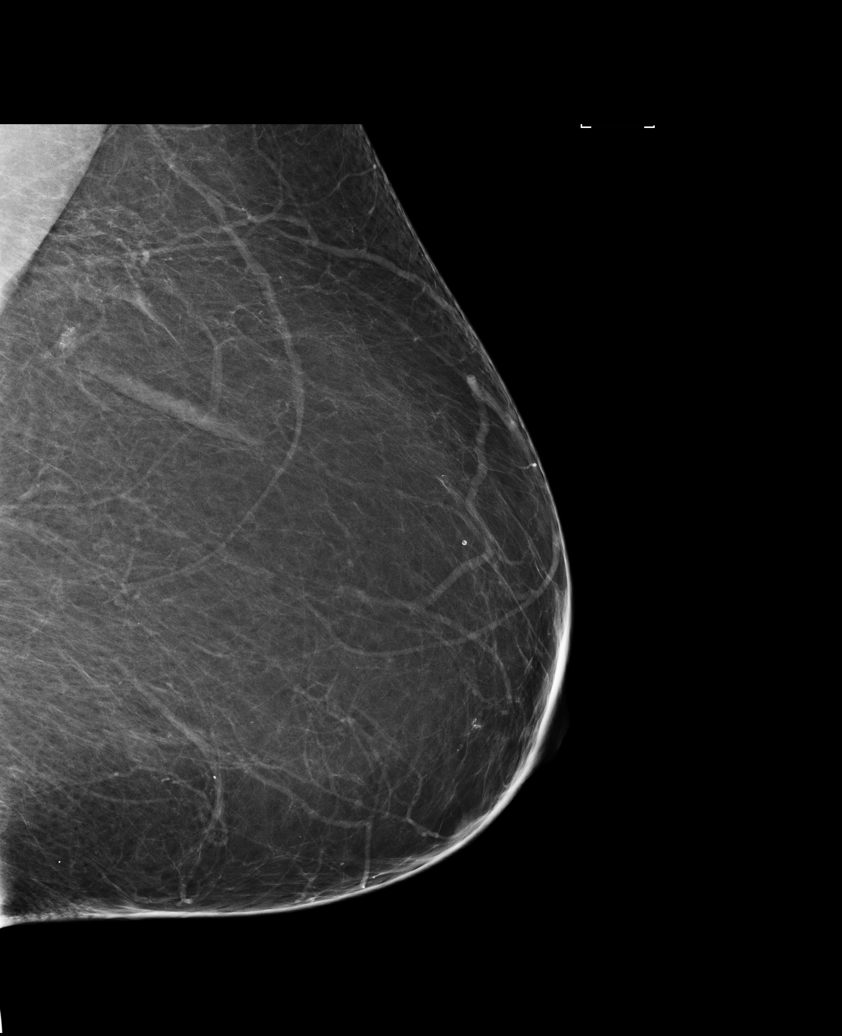

[5 of 5 positions shown; findings below may reference images not displayed]

ACR Breast Density Category b: There are scattered areas of
fibroglandular density.
FINDINGS: There are no findings suspicious for malignancy. Images were
processed with CAD.
IMPRESSION: No mammographic evidence of malignancy. A result letter of this
screening mammogram will be mailed directly to the patient.

RECOMMENDATION:
Screening mammogram in one year. (Code:S9-C-30F)

BI-RADS CATEGORY  1: Negative.

## 2019-08-03 IMAGING — CT CT PELVIS WITH CONTRAST
1 series · 15 of 32 positions shown, 19 images · IV contrast (iopamidol)
Comparison: 10/12/2018

CLINICAL DATA: Gluteal infectionAssess depth of infectionPatient
has infection between buttock crackSymptoms x several years

EXAM:
CT PELVIS WITH CONTRAST
TECHNIQUE: Multidetector CT imaging of the pelvis was performed using the
standard protocol following the bolus administration of intravenous
contrast.
CONTRAST:  125mL [id number] IOPAMIDOL ([id number]) INJECTION 61%

[Series 2: routine pelvis w/cm · axial · 0.98mm/px · z∈[-550,-280]mm · 15 of 60 slices shown, 19 images]
[im 4/60  soft-tissue]
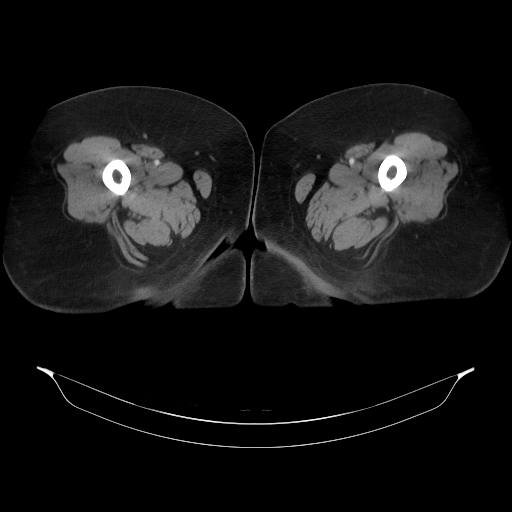
[im 4/60  bone]
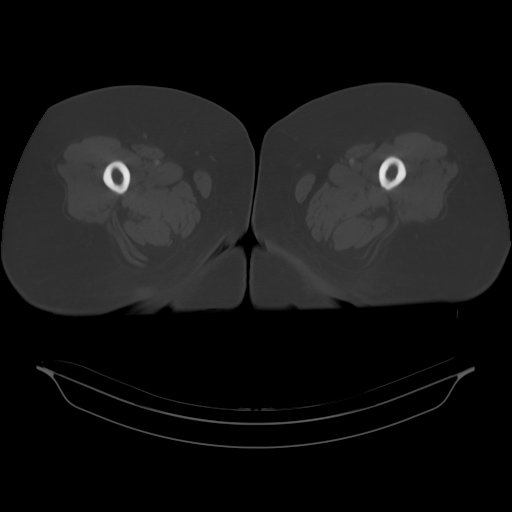
[im 8/60  soft-tissue]
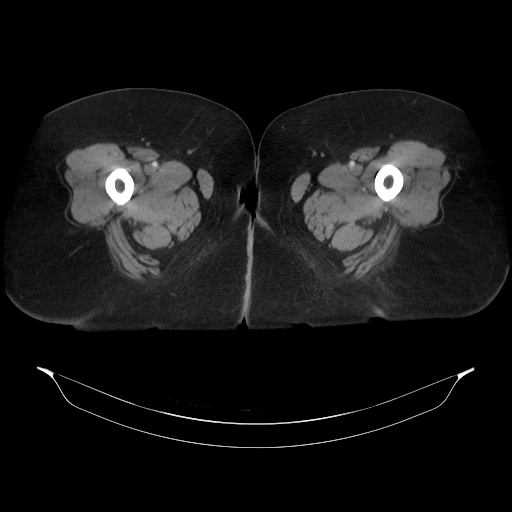
[im 12/60  soft-tissue]
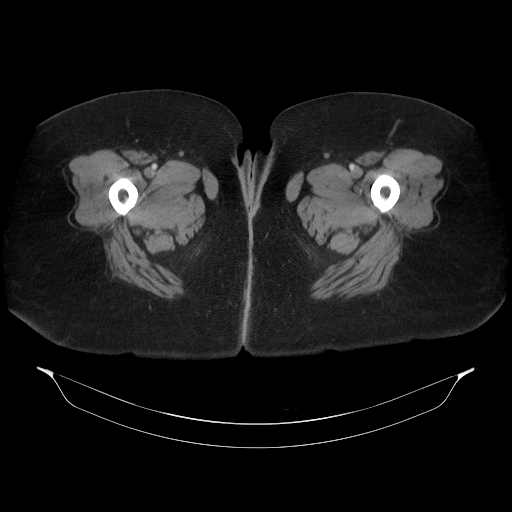
[im 18/60  soft-tissue]
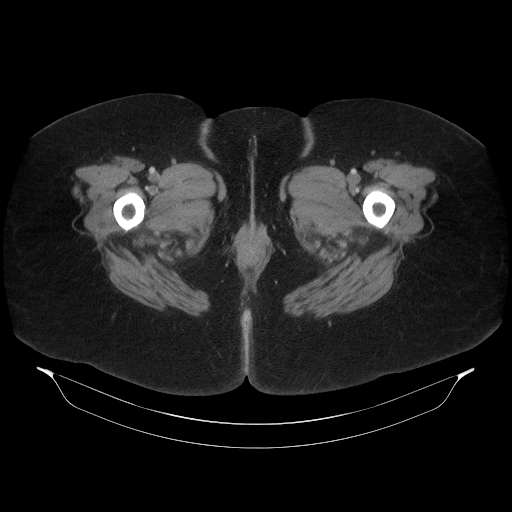
[im 21/60  soft-tissue]
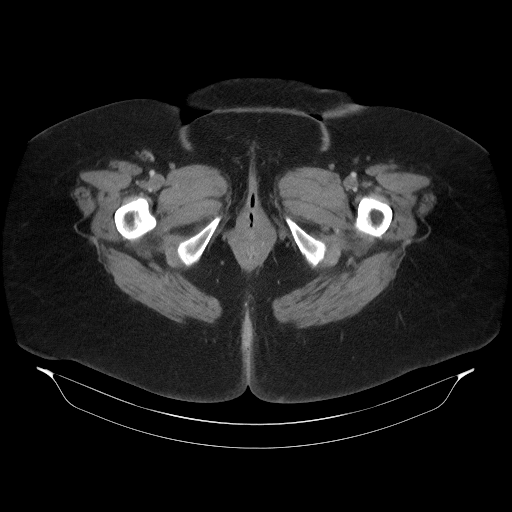
[im 25/60  soft-tissue]
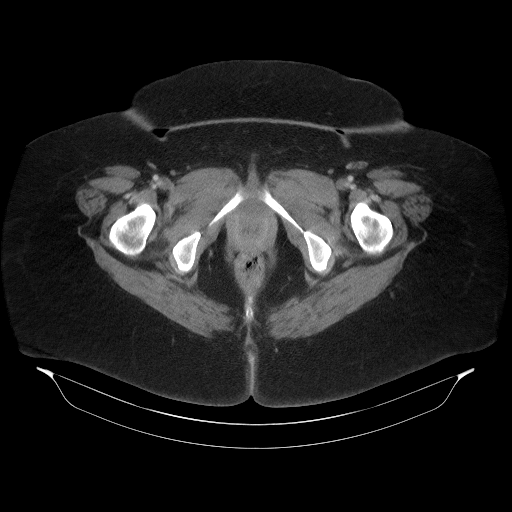
[im 31/60  soft-tissue]
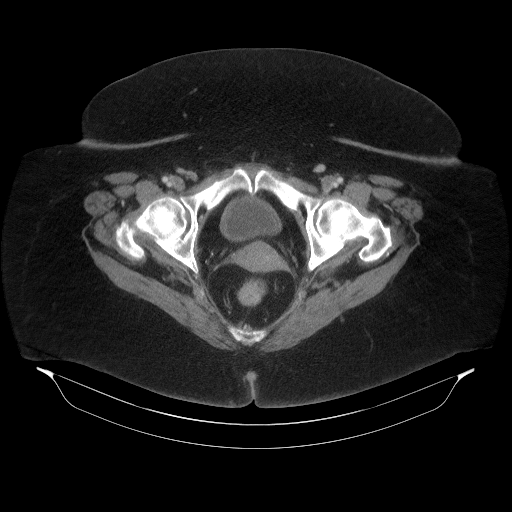
[im 35/60  soft-tissue]
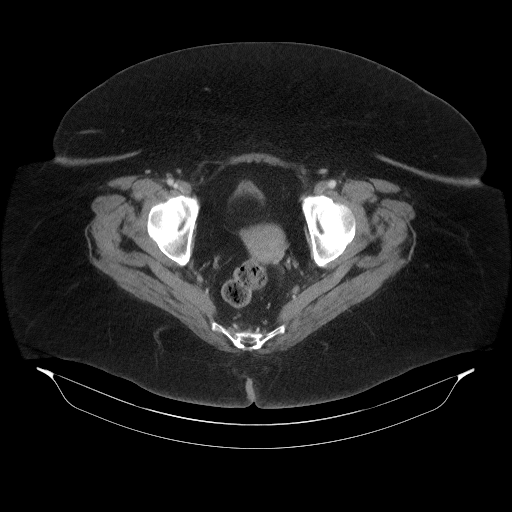
[im 39/60  soft-tissue]
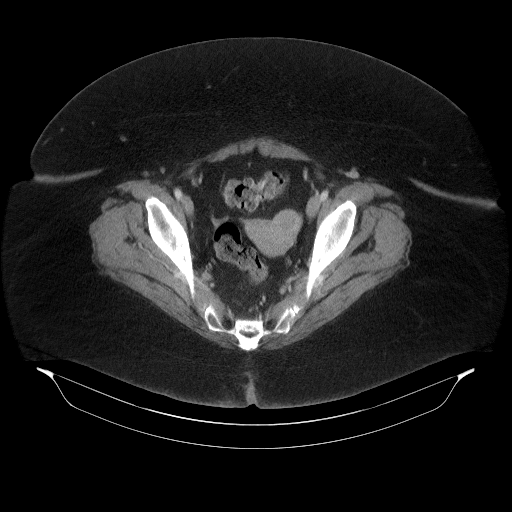
[im 39/60  bone]
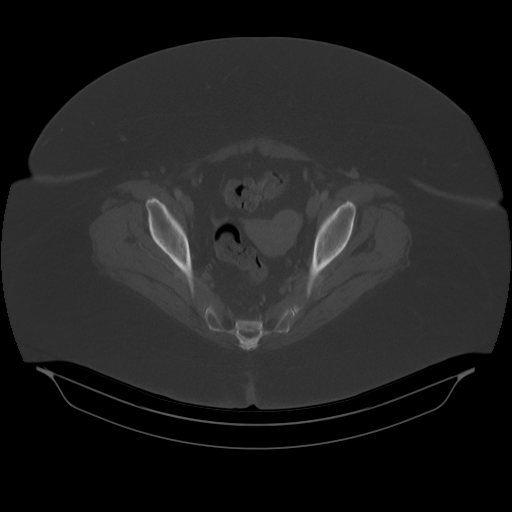
[im 42/60  soft-tissue]
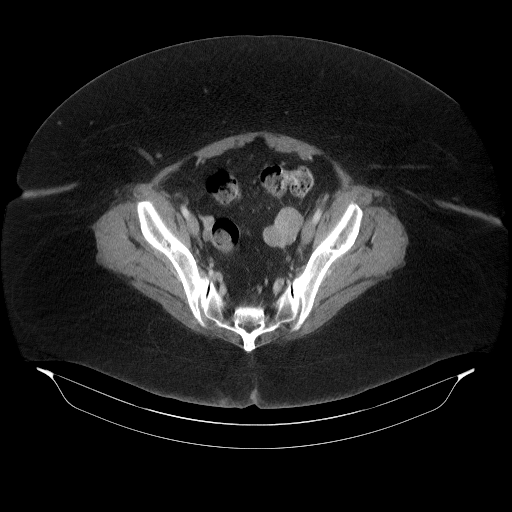
[im 48/60  soft-tissue]
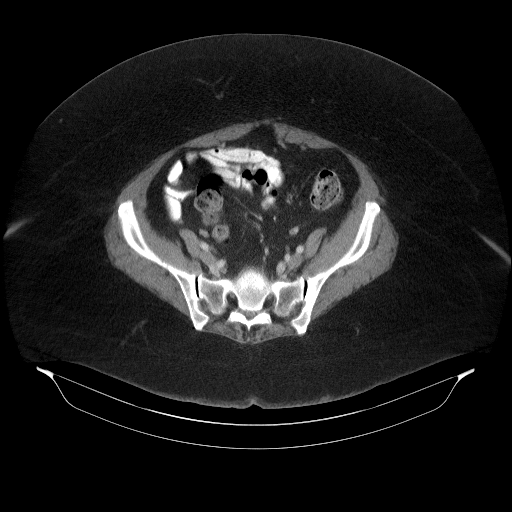
[im 52/60  soft-tissue]
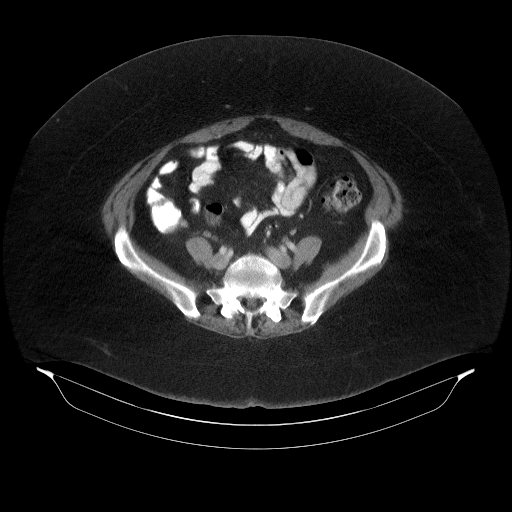
[im 52/60  lung]
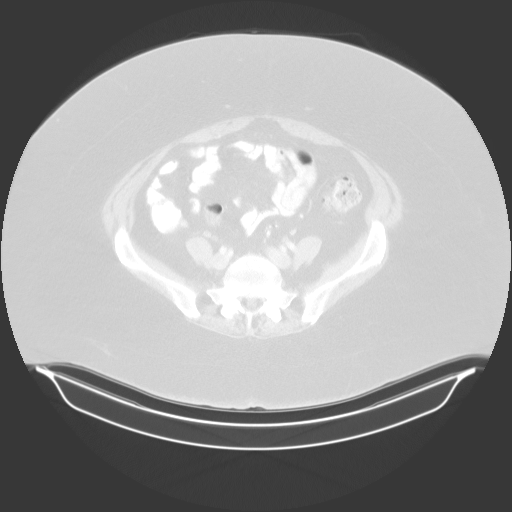
[im 54/60  lung]
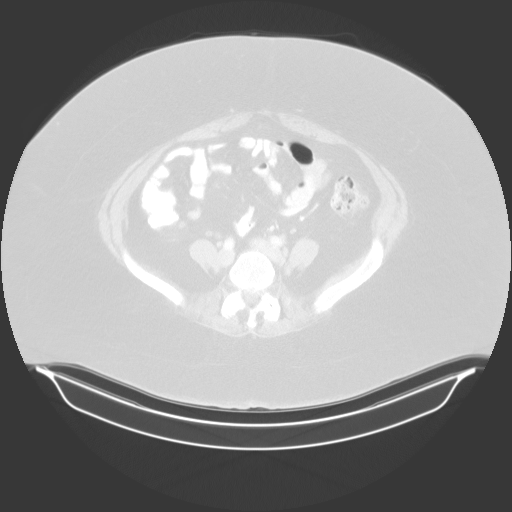
[im 56/60  soft-tissue]
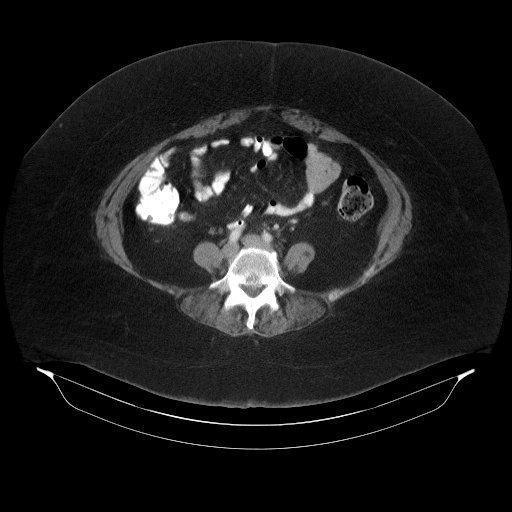
[im 56/60  lung]
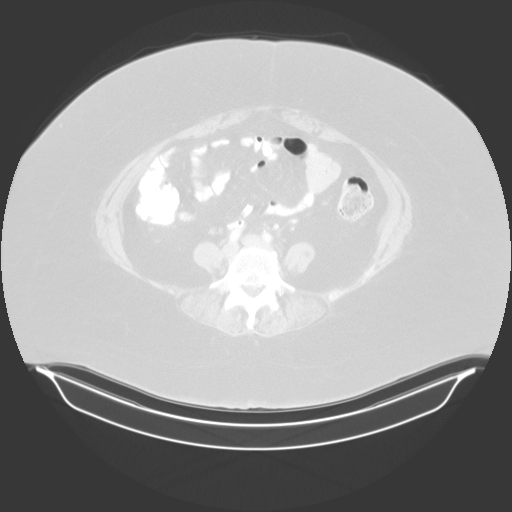
[im 58/60  lung]
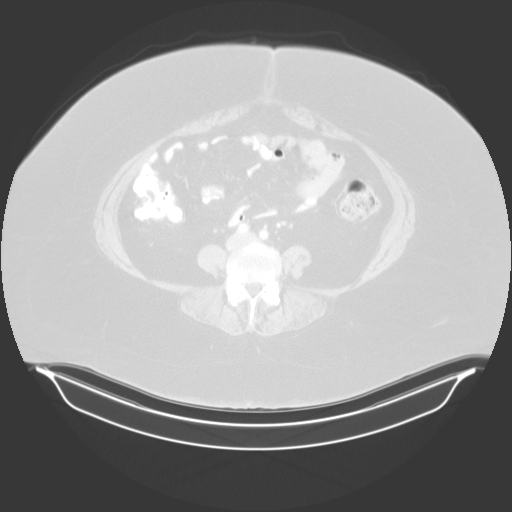

[15 of 32 positions shown; findings below may reference images not displayed]

FINDINGS: Soft tissues: There is mild thickening and adjacent inflammatory
haziness and stranding along the gluteal cleft from its superior
margin to just above anus. There is no fluid collection to suggest
an abscess. There are several small areas of subcutaneous soft
tissue inflammation just below the skin of both buttocks.

No mass or abscess.

Urinary Tract:  Unremarkable.

Bowel:  No bowel dilation, wall thickening or inflammation.

Vascular/Lymphatic: No vascular abnormality no enlarged lymph nodes.

Reproductive: Pedunculated left adnexal fibroids, stable. No other
abnormality.

Other:  No ascites.

Musculoskeletal: No fracture. No bone resorption to suggest
osteomyelitis.
IMPRESSION: 1. Mild inflammation noted along deep aspect of the gluteal cleft,
without evidence of an abscess.

## 2019-12-07 ENCOUNTER — Ambulatory Visit: Payer: 59 | Admitting: Internal Medicine

## 2019-12-07 ENCOUNTER — Encounter: Payer: Self-pay | Admitting: Internal Medicine

## 2019-12-07 ENCOUNTER — Other Ambulatory Visit: Payer: Self-pay

## 2019-12-07 DIAGNOSIS — K612 Anorectal abscess: Secondary | ICD-10-CM | POA: Diagnosis not present

## 2019-12-07 DIAGNOSIS — G4733 Obstructive sleep apnea (adult) (pediatric): Secondary | ICD-10-CM

## 2019-12-07 DIAGNOSIS — L0591 Pilonidal cyst without abscess: Secondary | ICD-10-CM | POA: Insufficient documentation

## 2019-12-07 NOTE — Assessment & Plan Note (Signed)
She has lost some weight since original NPSG.

## 2019-12-07 NOTE — Assessment & Plan Note (Signed)
I encouraged her to get this fixed with skin graft if appropriate, so she can resume sleeping comfort comfortably.

## 2019-12-07 NOTE — Assessment & Plan Note (Signed)
Attempted surgical repair, but it continues to drain. Unable to sleep in positions compatible with wearing CPAP. She may decide to explore more radical surgery/ nasal pillows.

## 2019-12-07 NOTE — Patient Instructions (Signed)
I am sorry you have had so much trouble with that cyst. When you are able to sleep on your back comfortably again we will want to update your sleep study to see where things stand.  For now, the best things to do for sleep apnea are to lose some more weight and to sleep off the flat of your back, which you are doing anyway.  Please call if we an help.

## 2019-12-07 NOTE — Assessment & Plan Note (Addendum)
Until pilonidal cyst is repaired, she isn't able to sleep in good position for CPAP. We discussed alternative therapies.  Plan - on hold until she decides what to do about her cyst. Meanwhile continue to work on weight. Likely will need a new diagnostic sleep study

## 2019-12-07 NOTE — Progress Notes (Signed)
HPI 59 year old female never smoker followed for OSA, complicated by Crohn's disease, HBP, LBBB, history of bariatric surgery, PHTN, Cardiomyopathy, Acute CHF , Rheumatoid Arthritis, Morbid Obesity, Unattended Home Sleep Test 09/02/2016- AHI 16.0/hour, desaturation to 62%, body weight 297 pounds. Office Spirometry 09/18/2016-mild restriction of exhaled volume-FVC 1.97/75%, FEV1 1.60/77%, FEV1/FVC 0.81, FEF 25-75 percent 1.75/83%.  -----------------------------------------------------------------------------------  12/05/2018-59 year old female never smoker followed for OSA, complicated by Crohn's disease, HBP, LBBB, history of bariatric surgery -----Pt last seen 2017 for per op clearance-states she is having hard time with chest discomfort x 2 months; cleared by heart dr that its not her heart-not resting well due to unable to breathe at night.  Pt is not wearing CPAP.  Body weight today 270 pounds (297 pounds in 2017) Nasal congestion lying down. Nocturnal cough- dry or scant clear x 2 months. Feels "twitch" sternal area, occ to back. Not short of breath. Pending gastric sleeve Bariatric surgery eval. Pain in buttock - has to sleep prone, and up every 2 hours. Interferes with CPAP.  CXR 08/09/2017- IMPRESSION: Mild cardiomegaly with vascular congestion and probable perihilar edema. Findings suggest mild fluid overload/CHF  12/07/19- 59 year old female never smoker followed for OSA, complicated by Crohn's disease, HBP, LBBB, history of bariatric surgery, PHTN, Cardiomyopathy, Acute CHF, , Rheumatoid Arthritis, Morbid Obesity,  Not using CPAP Body weight today 264 lbs- was 297 lbs in 2017 Can't sleep on back after pilonidal cyst surgery which continues to drain. Must sleep on stomach, sometimes side to side. Unable to sleep in position to use CPAP. May be pending skin graft to repair cyst. Husband on CPAP. He doesn't tell her if she is still snoring after weight loss.  Cough was much improved by  switch to Benicar, indicating ACEI induced cough. CXR 12/06/2018-  No acute disease  ROS-see HPI   + = positive Constitutional:    weight loss, night sweats, fevers, chills, + fatigue, lassitude. HEENT:    headaches, difficulty swallowing, tooth/dental problems, sore throat,       sneezing, itching, ear ache, nasal congestion, post nasal drip, snoring CV:    chest pain, orthopnea, PND, + swelling in lower extremities, anasarca,                                                      dizziness, palpitations Resp:  + shortness of breath with exertion or at rest.                productive cough,   non-productive cough, coughing up of blood.              change in color of mucus.  wheezing.   Skin:    rash or lesions. GI:  No-   heartburn, indigestion, abdominal pain, nausea, vomiting, diarrhea,                 change in bowel habits, loss of appetite GU: dysuria, change in color of urine, no urgency or frequency.   flank pain. MS:   joint pain, stiffness, decreased range of motion, +back pain. Neuro-     nothing unusual Psych:  change in mood or affect.  depression or anxiety.   memory loss.  OBJ- Physical Exam General- Alert, Oriented, Affect-appropriate, Distress- none acute, + morbidly obese Skin- rash-none, lesions- none, excoriation- none, cool/ clammy Lymphadenopathy- none Head- atraumatic  Eyes- Gross vision intact, PERRLA, conjunctivae and secretions clear            Ears- Hearing, canals-normal            Nose- Clear, no-Septal dev, mucus, polyps, erosion, perforation             Throat- Mallampati III-IV , mucosa clear , drainage- none, tonsils- atrophic Neck- flexible , trachea midline, no stridor , thyroid nl, carotid no bruit Chest - symmetrical excursion , unlabored           Heart/CV- RRR , no murmur , no gallop  , no rub, nl s1 s2                           - JVD- none , edema- none, stasis changes- none, varices- none           Lung- clear to P&A, wheeze- none,  cough- none , dullness-none, rub- none           Chest wall-  Abd-  Br/ Gen/ Rectal- Not done, not indicated Extrem- cyanosis- none, clubbing, none, atrophy- none, strength- nl Neuro- grossly intact to observation

## 2020-04-02 ENCOUNTER — Other Ambulatory Visit: Payer: Self-pay | Admitting: Family Medicine

## 2020-04-02 DIAGNOSIS — Z1231 Encounter for screening mammogram for malignant neoplasm of breast: Secondary | ICD-10-CM

## 2020-04-04 ENCOUNTER — Emergency Department (HOSPITAL_COMMUNITY)
Admission: EM | Admit: 2020-04-04 | Discharge: 2020-04-04 | Disposition: A | Discharge status: Home / Self Care | Payer: Medicare Other | Attending: Emergency Medicine | Admitting: Emergency Medicine

## 2020-04-04 ENCOUNTER — Other Ambulatory Visit: Payer: Self-pay

## 2020-04-04 ENCOUNTER — Emergency Department (HOSPITAL_COMMUNITY): Payer: Medicare Other

## 2020-04-04 ENCOUNTER — Encounter (HOSPITAL_COMMUNITY): Payer: Self-pay

## 2020-04-04 DIAGNOSIS — Z79899 Other long term (current) drug therapy: Secondary | ICD-10-CM | POA: Insufficient documentation

## 2020-04-04 DIAGNOSIS — J45909 Unspecified asthma, uncomplicated: Secondary | ICD-10-CM | POA: Diagnosis not present

## 2020-04-04 DIAGNOSIS — R002 Palpitations: Secondary | ICD-10-CM | POA: Diagnosis not present

## 2020-04-04 DIAGNOSIS — R0789 Other chest pain: Secondary | ICD-10-CM | POA: Insufficient documentation

## 2020-04-04 DIAGNOSIS — I10 Essential (primary) hypertension: Secondary | ICD-10-CM | POA: Insufficient documentation

## 2020-04-04 DIAGNOSIS — R079 Chest pain, unspecified: Secondary | ICD-10-CM | POA: Diagnosis present

## 2020-04-04 LAB — BASIC METABOLIC PANEL
Anion gap: 9 (ref 5–15)
BUN: 15 mg/dL (ref 8–23)
CO2: 29 mmol/L (ref 22–32)
Calcium: 9.4 mg/dL (ref 8.9–10.3)
Chloride: 103 mmol/L (ref 98–111)
Creatinine, Ser: 0.72 mg/dL (ref 0.44–1.00)
GFR calc Af Amer: >60 mL/min (ref >60)
GFR calc non Af Amer: >60 mL/min (ref >60)
Glucose, Bld: 99 mg/dL (ref 70–99)
Potassium: 3.6 mmol/L (ref 3.5–5.1)
Sodium: 141 mmol/L (ref 135–145)

## 2020-04-04 LAB — CBC
HCT: 37.1 % (ref 36.0–46.0)
Hemoglobin: 12.2 g/dL (ref 12.0–15.0)
MCH: 29.8 pg (ref 26.0–34.0)
MCHC: 32.9 g/dL (ref 30.0–36.0)
MCV: 90.5 fL (ref 80.0–100.0)
Platelets: 241 10*3/uL (ref 150–400)
RBC: 4.1 MIL/uL (ref 3.87–5.11)
RDW: 14.3 % (ref 11.5–15.5)
WBC: 5.7 10*3/uL (ref 4.0–10.5)
nRBC: 0 % (ref 0.0–0.2)

## 2020-04-04 LAB — TROPONIN I (HIGH SENSITIVITY): Troponin I (High Sensitivity): 3 ng/L (ref <18)

## 2020-04-04 MED ORDER — METAXALONE 800 MG PO TABS: 800.0000 mg | ORAL_TABLET | Freq: Three times a day (TID) | ORAL | 0 refills | Status: DC

## 2020-04-04 MED ORDER — SODIUM CHLORIDE 0.9% FLUSH: 3.0000 mL | Freq: Once | INTRAVENOUS | Status: DC

## 2020-04-04 NOTE — ED Provider Notes (Signed)
[facility] DEPT Provider Note   CSN: [id number] Arrival date & time: 04/04/20  1030     History Chief Complaint  Patient presents with  . Chest Pain  . Palpitations    [name] [name] is a 59 y.o. female.  59 year old female who presents with chest pain times several weeks.  Pain is midsternal and persistent and worse with movement or palpation to the area.  No associated leg pain or swelling.  No fever cough or congestion.  Called her doctor who prescribed a Z-Pak over the phone for this.  Denies any anginal or CHF symptoms.  Called her doctor again today and was in form to come in for evaluation        Past Medical History:  Diagnosis Date  . Asthma   . Hypertension   . Lumbar herniated disc   . Pre-diabetes   . Rheumatoid arthritis ([place])   . Sleep apnea    CPAP    Patient Active Problem List   Diagnosis Date Noted  . Pilonidal cyst 12/07/2019  . Chest pain 10/27/2018  . Medication management 02/17/2018  . Cardiomyopathy ([place]) 10/02/2017  . Morbid obesity with BMI of 45.0-49.9, adult ([place]) 10/02/2017  . Pulmonary hypertension, unspecified ([place]) 10/02/2017  . Rheumatoid arthritis ([place]) 10/02/2017  . Acute CHF (congestive heart failure) ([place]) 08/09/2017  . Prediabetes 09/29/2016  . 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 Obstructive sleep apnea 09/18/2016  . 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 Itching-Bilateral dorsum foot 07/11/2015  . Pain in joint, ankle and foot 07/11/2015  . Foot swelling 07/11/2015  . FUO (fever of unknown origin) 03/14/2012  . Hypokalemia 03/14/2012  . Hyponatremia 03/14/2012  . Dehydration 03/14/2012  . LBBB (left bundle branch block) 03/14/2012  . Obesity, morbid ([place]) 03/07/2008  . Essential hypertension 03/07/2008  . [place] DISEASE 03/07/2008  . ABSCESS, PERIRECTAL 03/07/2008    Past Surgical History:  Procedure Laterality Date  . BREAST SURGERY     breast  reduction   . LAPAROSCOPIC GASTRIC SLEEVE RESECTION N/A 09/29/2016   Procedure: LAPAROSCOPIC GASTRIC SLEEVE RESECTION, UPPER ENDO;  Surgeon: Greer Pickerel, MD;  Location: [facility] ORS;  Service: General;  Laterality: N/A;  . REDUCTION MAMMAPLASTY Bilateral 2003  . removed bone from left knee     . UPPER GI ENDOSCOPY  09/29/2016   Procedure: UPPER GI ENDOSCOPY;  Surgeon: Greer Pickerel, MD;  Location: [facility] ORS;  Service: General;;     OB History   No obstetric history on file.     Family History  Problem Relation Age of Onset  . Ovarian cancer Mother 57       Not sure of this diagnosis  . Diabetes Brother   . Hypertension Brother   . Breast cancer Neg Hx     Social History   Tobacco Use  . Smoking status: Never Smoker  . Smokeless tobacco: Never Used  Substance Use Topics  . Alcohol use: No    Alcohol/week: 0.0 standard drinks  . Drug use: No    Home Medications Prior to Admission medications   Medication Sig Start Date End Date Taking? Authorizing Provider  azithromycin (ZITHROMAX) 250 MG tablet Take 250-500 mg by mouth as directed. 500mg  on day one 250mg  on day 2-5 03/27/20   [provider]  carvedilol (COREG) 12.5 MG tablet Take 1 tablet (12.5 mg total) by mouth 2 (two) times daily with a meal. 12/10/17   Minus Breeding, MD  furosemide (LASIX) 40 MG tablet  Take 1 tablet (40 mg total) by mouth daily. KEEP OV. 07/30/18   Minus Breeding, MD  montelukast (SINGULAIR) 10 MG tablet Take 10 mg by mouth daily. 03/27/20   [provider]  olmesartan (BENICAR) 20 MG tablet Take 1 tablet (20 mg total) by mouth daily. 12/06/18   Baird Lyons D, MD  valACYclovir (VALTREX) 500 MG tablet TAKE 1 TABLET BY MOUTH EVERY DAY Patient taking differently: TAKE 1 TABLET BY MOUTH EVERY DAY AS NEEDED FOR FLARE UP 06/14/15   Le, Thao P, DO    Allergies    Penicillins, Amoxicillin, Tetanus-diphth-acell pertussis, and Tdap [tetanus-diphth-acell pertussis]  Review of Systems   Review of Systems    All other systems reviewed and are negative.   Physical Exam Updated Vital Signs BP 124/82 (BP Location: Left Arm)   Pulse 77   Temp 98.3 F (36.8 C) (Oral)   Resp (!) 27   Ht 1.6 m (5\' 3" )   Wt 120.2 kg   SpO2 99%   BMI 46.94 kg/m   Physical Exam Vitals and nursing note reviewed.  Constitutional:      General: She is not in acute distress.    Appearance: Normal appearance. She is well-developed. She is not toxic-appearing.  HENT:     Head: Normocephalic and atraumatic.  Eyes:     General: Lids are normal.     Conjunctiva/sclera: Conjunctivae normal.     Pupils: Pupils are equal, round, and reactive to light.  Neck:     Thyroid: No thyroid mass.     Trachea: No tracheal deviation.  Cardiovascular:     Rate and Rhythm: Normal rate and regular rhythm.     Heart sounds: Normal heart sounds. No murmur. No gallop.   Pulmonary:     Effort: Pulmonary effort is normal. No respiratory distress.     Breath sounds: Normal breath sounds. No stridor. No decreased breath sounds, wheezing, rhonchi or rales.  Chest:    Abdominal:     General: Bowel sounds are normal. There is no distension.     Palpations: Abdomen is soft.     Tenderness: There is no abdominal tenderness. There is no rebound.  Musculoskeletal:        General: No tenderness. Normal range of motion.     Cervical back: Normal range of motion and neck supple.  Skin:    General: Skin is warm and dry.     Findings: No abrasion or rash.  Neurological:     Mental Status: She is alert and oriented to person, place, and time.     GCS: GCS eye subscore is 4. GCS verbal subscore is 5. GCS motor subscore is 6.     Cranial Nerves: No cranial nerve deficit.     Sensory: No sensory deficit.  Psychiatric:        Speech: Speech normal.        Behavior: Behavior normal.     ED Results / Procedures / Treatments   Labs (all labs ordered are listed, but only abnormal results are displayed) Labs Reviewed  BASIC METABOLIC  PANEL  CBC  TROPONIN I (HIGH SENSITIVITY)    EKG None ED ECG REPORT   Date: 04/04/2020  Rate: 73  Rhythm: normal sinus rhythm  QRS Axis: normal  Intervals: normal  ST/T Wave abnormalities: nonspecific ST changes  Conduction Disutrbances:left bundle branch block  Narrative Interpretation:   Old EKG Reviewed: unchanged  I have personally reviewed the EKG tracing and agree with the computerized  printout as noted. Radiology No results found.  Procedures Procedures (including critical care time)  Medications Ordered in ED Medications  sodium chloride flush (NS) 0.9 % injection 3 mL (has no administration in time range)    ED Course  I have reviewed the triage vital signs and the nursing notes.  Pertinent labs & imaging results that were available during my care of the patient were reviewed by me and considered in my medical decision making (see chart for details).    MDM Rules/Calculators/A&P                      Patient is EKG unchanged from prior studies.  Troponin is negative here and given that she has had symptoms for several weeks there is no need to repeat another one.  Pain is reproducible on her chest wall.  Will prescribe muscle relaxants and discharged home Final Clinical Impression(s) / ED Diagnoses Final diagnoses:  None    Rx / DC Orders ED Discharge Orders    None       Lacretia Leigh, MD 04/04/20 1219

## 2020-04-04 NOTE — ED Notes (Signed)
Pt transported to xray 

## 2020-04-04 NOTE — ED Triage Notes (Signed)
Patient c/o intermittent mid and left chest pain that radiates into the left arm. Patient states she has had palpitations today as well.

## 2020-04-18 ENCOUNTER — Encounter (HOSPITAL_COMMUNITY): Payer: Self-pay

## 2020-05-15 ENCOUNTER — Ambulatory Visit
Admission: RE | Admit: 2020-05-15 | Discharge: 2020-05-15 | Disposition: A | Discharge status: Home / Self Care | Payer: Medicare Other | Source: Ambulatory Visit | Attending: Family Medicine | Admitting: Family Medicine

## 2020-05-15 ENCOUNTER — Other Ambulatory Visit: Payer: Self-pay

## 2020-05-15 DIAGNOSIS — Z1231 Encounter for screening mammogram for malignant neoplasm of breast: Secondary | ICD-10-CM

## 2020-09-14 IMAGING — US US PELVIS COMPLETE TRANSABD/TRANSVAG
1 series · 14 of 25 positions shown · non-contrast
Comparison: CT of the pelvis October 12, 2018

CLINICAL DATA: Probable uterine fibroid seen on CT imaging.

EXAM:
TRANSABDOMINAL AND TRANSVAGINAL ULTRASOUND OF PELVIS
TECHNIQUE: Both transabdominal and transvaginal ultrasound examinations of the
pelvis were performed. Transabdominal technique was performed for
global imaging of the pelvis including uterus, ovaries, adnexal
regions, and pelvic cul-de-sac. It was necessary to proceed with
endovaginal exam following the transabdominal exam to visualize the
endometrium and ovaries..

[Series 1: us pelvis complete transabd/transvag · 0.13mm/px · 14 of 58 slices shown]
[im 1/58]
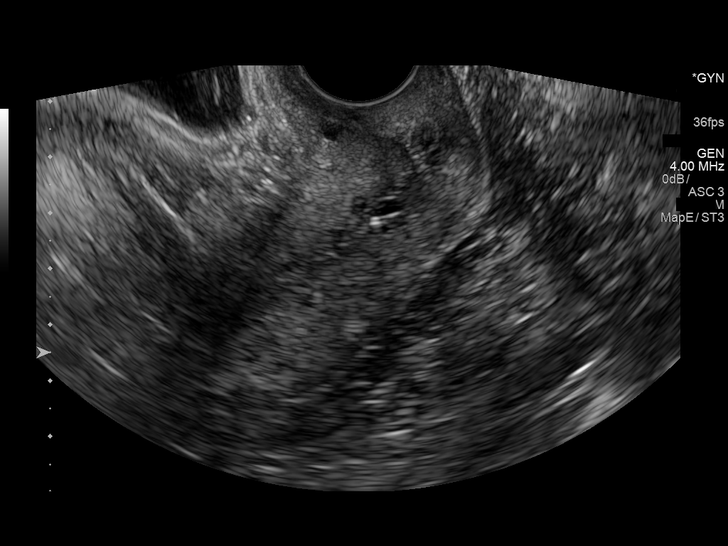
[im 5/58]
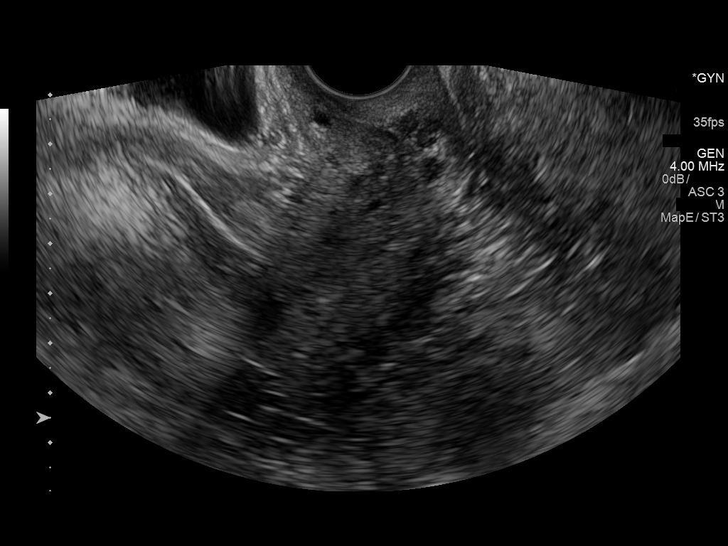
[im 10/58]
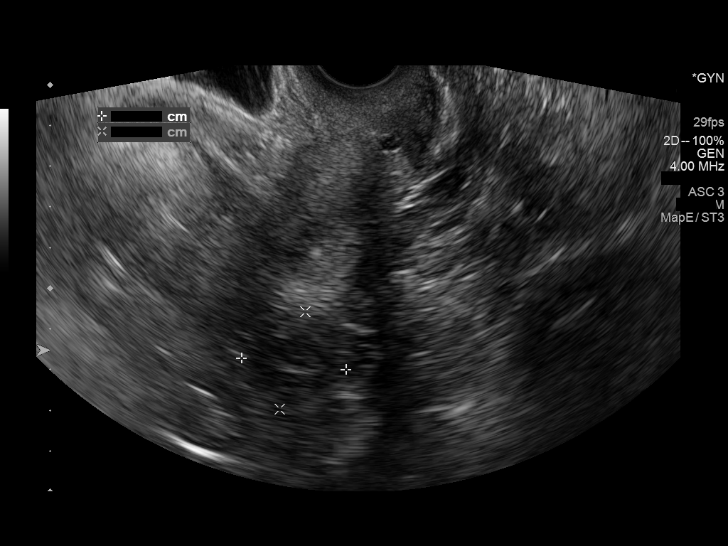
[im 15/58]
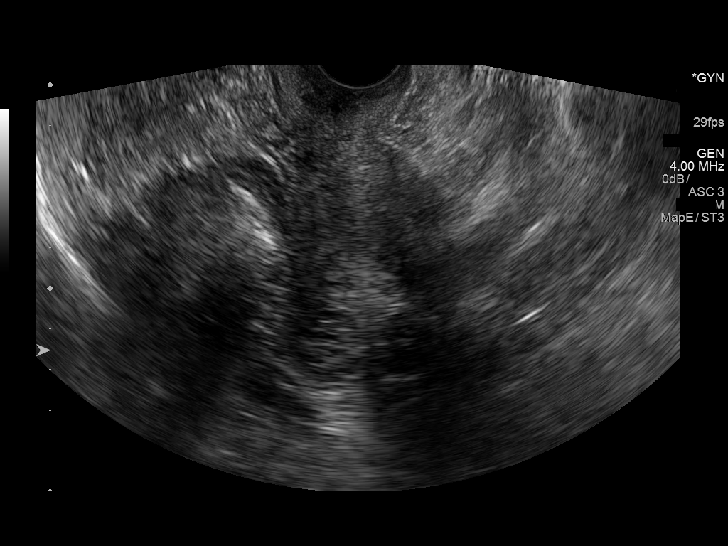
[im 20/58]
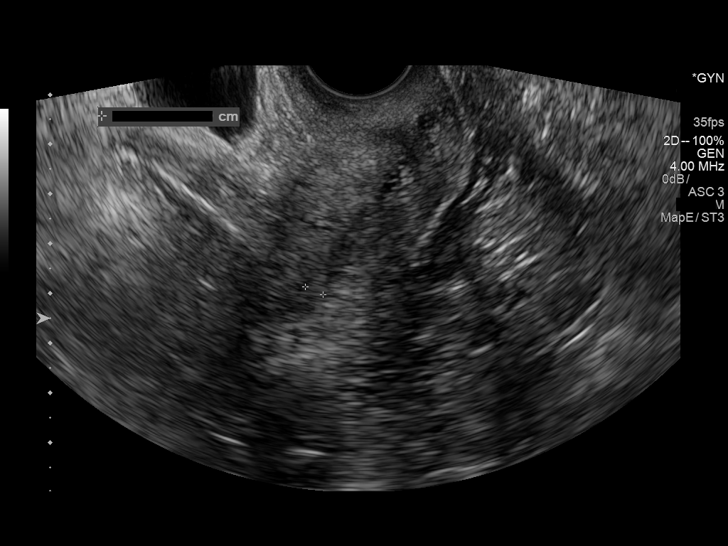
[im 22/58]
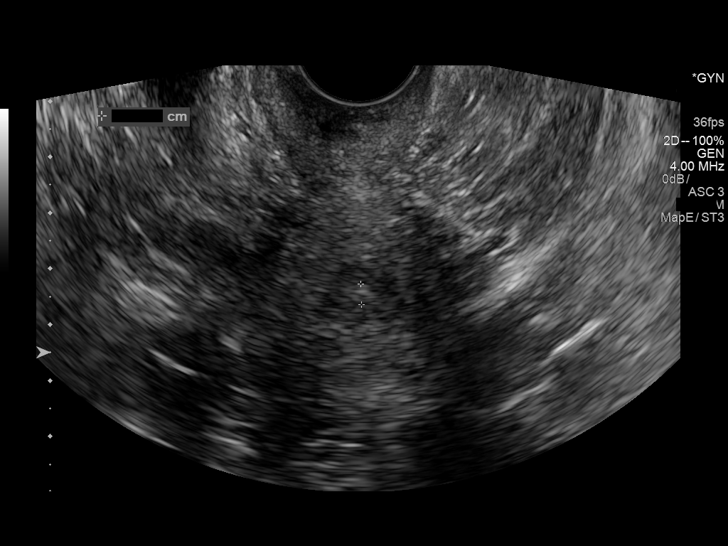
[im 27/58]
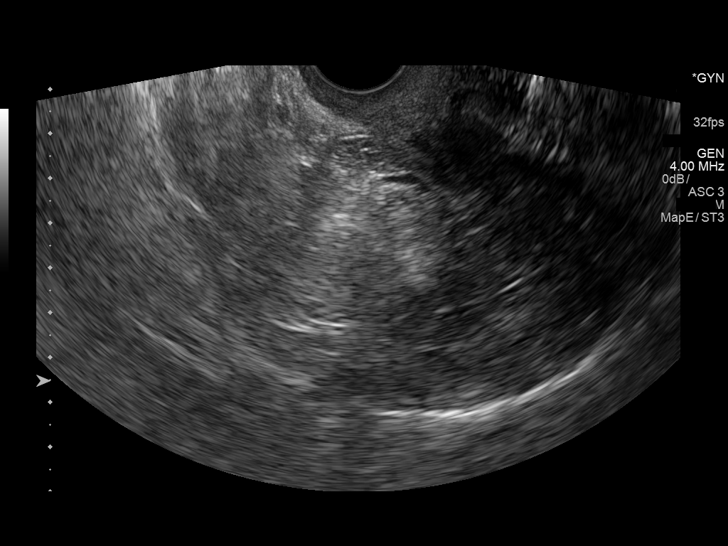
[im 31/58]
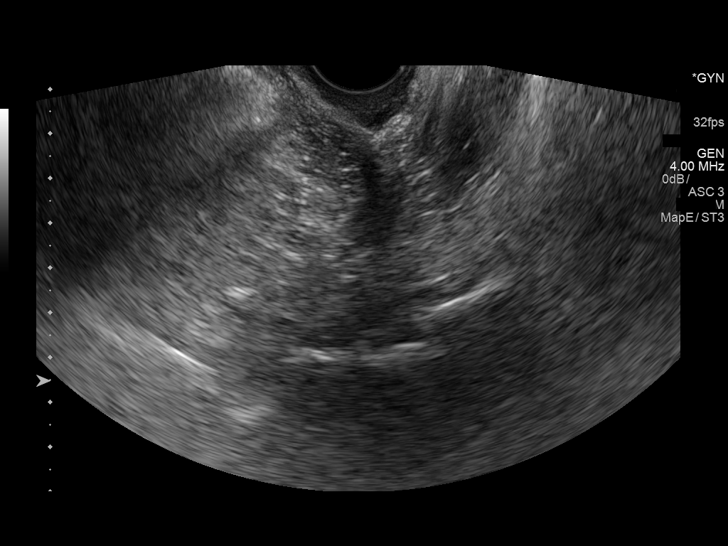
[im 36/58]
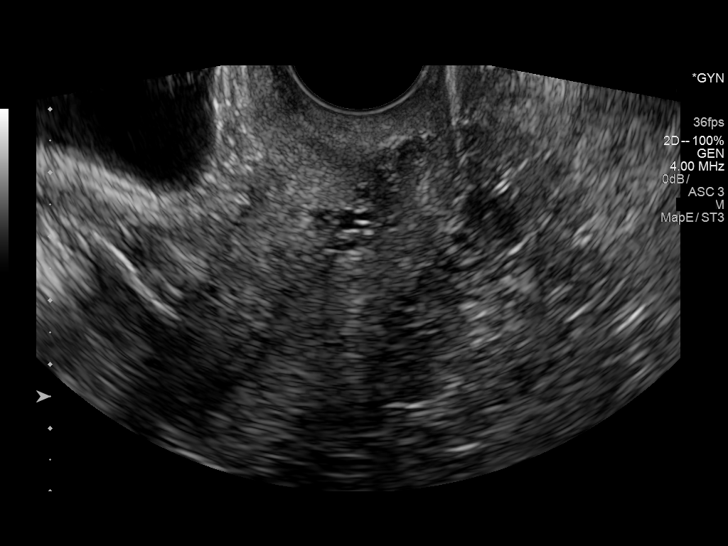
[im 39/58]
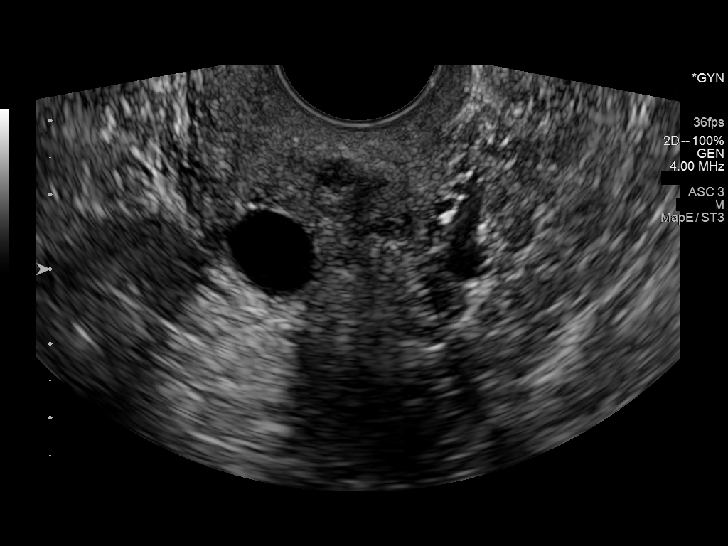
[im 43/58]
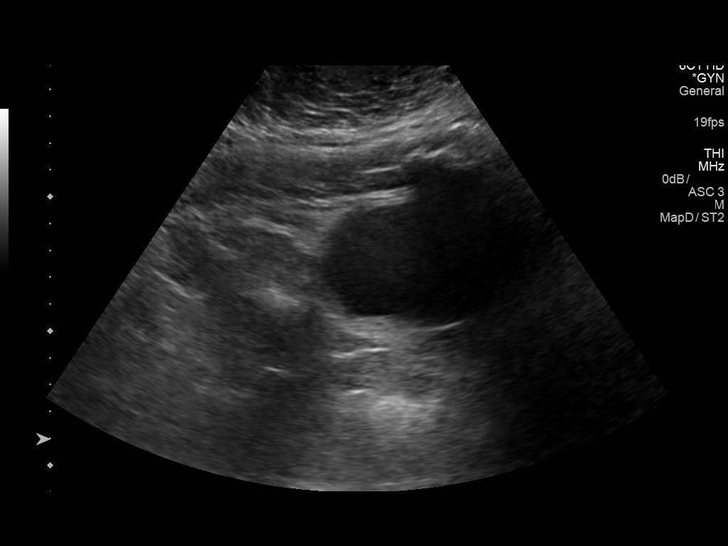
[im 48/58]
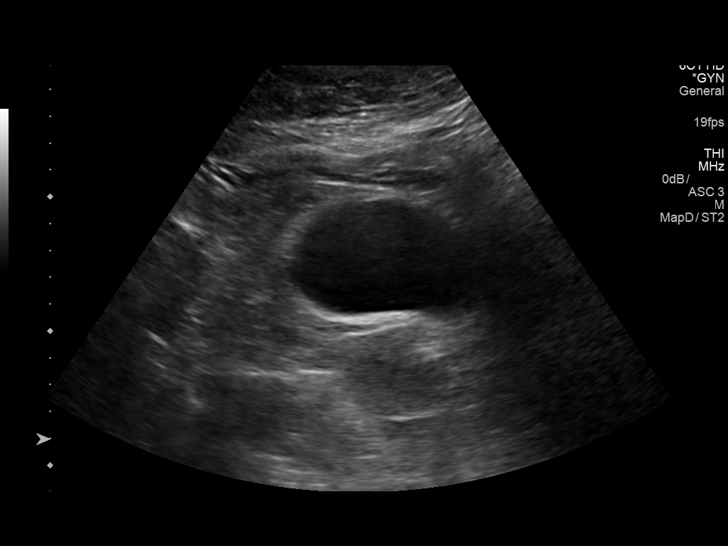
[im 53/58]
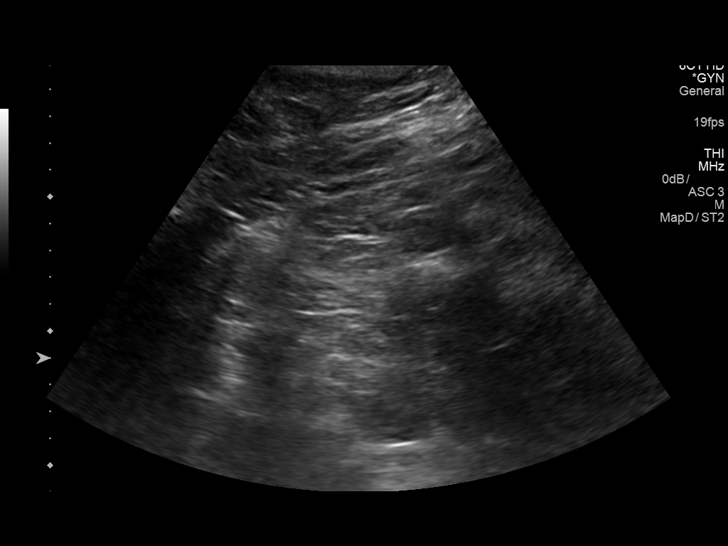
[im 58/58]
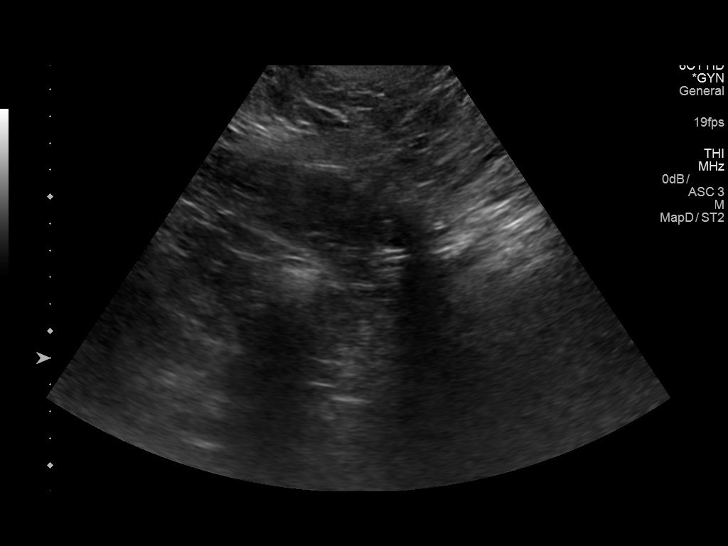

[14 of 25 positions shown; findings below may reference images not displayed]

FINDINGS: Uterus

Measurements: 7.0 x 3.0 x 4.5 cm = volume: 50 mL. Contains a 2.6 x
2.5 x 2.1 cm exophytic fibroid.

Endometrium

Thickness: 3.9 mm.  No focal abnormality visualized.

Right ovary

Not visualized.

Left ovary

Not visualized.

Other findings

No abnormal free fluid.
IMPRESSION: 1. Single uterine fibroid as above.
2. Neither ovary was visualized.

## 2020-12-20 DIAGNOSIS — L988 Other specified disorders of the skin and subcutaneous tissue: Secondary | ICD-10-CM | POA: Diagnosis not present

## 2020-12-20 DIAGNOSIS — Z8719 Personal history of other diseases of the digestive system: Secondary | ICD-10-CM | POA: Diagnosis not present

## 2020-12-20 DIAGNOSIS — K635 Polyp of colon: Secondary | ICD-10-CM | POA: Diagnosis not present

## 2020-12-20 DIAGNOSIS — D126 Benign neoplasm of colon, unspecified: Secondary | ICD-10-CM | POA: Diagnosis not present

## 2020-12-20 DIAGNOSIS — Z1211 Encounter for screening for malignant neoplasm of colon: Secondary | ICD-10-CM | POA: Diagnosis not present

## 2020-12-20 DIAGNOSIS — Z86010 Personal history of colonic polyps: Secondary | ICD-10-CM | POA: Diagnosis not present

## 2020-12-20 DIAGNOSIS — D123 Benign neoplasm of transverse colon: Secondary | ICD-10-CM | POA: Diagnosis not present

## 2020-12-20 DIAGNOSIS — K602 Anal fissure, unspecified: Secondary | ICD-10-CM | POA: Diagnosis not present

## 2020-12-25 DIAGNOSIS — R7303 Prediabetes: Secondary | ICD-10-CM | POA: Diagnosis not present

## 2020-12-25 DIAGNOSIS — E785 Hyperlipidemia, unspecified: Secondary | ICD-10-CM | POA: Diagnosis not present

## 2020-12-25 DIAGNOSIS — I10 Essential (primary) hypertension: Secondary | ICD-10-CM | POA: Diagnosis not present

## 2020-12-25 DIAGNOSIS — Z713 Dietary counseling and surveillance: Secondary | ICD-10-CM | POA: Diagnosis not present

## 2021-01-31 DIAGNOSIS — Z8679 Personal history of other diseases of the circulatory system: Secondary | ICD-10-CM | POA: Diagnosis not present

## 2021-01-31 DIAGNOSIS — Z9884 Bariatric surgery status: Secondary | ICD-10-CM | POA: Diagnosis not present

## 2021-01-31 DIAGNOSIS — M0579 Rheumatoid arthritis with rheumatoid factor of multiple sites without organ or systems involvement: Secondary | ICD-10-CM | POA: Diagnosis not present

## 2021-01-31 DIAGNOSIS — I10 Essential (primary) hypertension: Secondary | ICD-10-CM | POA: Diagnosis not present

## 2021-04-04 ENCOUNTER — Other Ambulatory Visit: Payer: Self-pay | Admitting: Family Medicine

## 2021-04-04 DIAGNOSIS — Z1231 Encounter for screening mammogram for malignant neoplasm of breast: Secondary | ICD-10-CM

## 2021-05-31 ENCOUNTER — Ambulatory Visit
Admission: RE | Admit: 2021-05-31 | Discharge: 2021-05-31 | Disposition: A | Discharge status: Home / Self Care | Payer: Medicare Other | Source: Ambulatory Visit | Attending: Family Medicine | Admitting: Family Medicine

## 2021-05-31 ENCOUNTER — Other Ambulatory Visit: Payer: Self-pay

## 2021-05-31 DIAGNOSIS — Z1231 Encounter for screening mammogram for malignant neoplasm of breast: Secondary | ICD-10-CM

## 2021-08-05 DIAGNOSIS — M069 Rheumatoid arthritis, unspecified: Secondary | ICD-10-CM | POA: Diagnosis not present

## 2021-08-05 DIAGNOSIS — G4733 Obstructive sleep apnea (adult) (pediatric): Secondary | ICD-10-CM | POA: Diagnosis not present

## 2021-08-05 DIAGNOSIS — I5022 Chronic systolic (congestive) heart failure: Secondary | ICD-10-CM | POA: Diagnosis not present

## 2021-08-05 DIAGNOSIS — I10 Essential (primary) hypertension: Secondary | ICD-10-CM | POA: Diagnosis not present

## 2021-08-05 DIAGNOSIS — R7303 Prediabetes: Secondary | ICD-10-CM | POA: Diagnosis not present

## 2021-08-05 DIAGNOSIS — Z23 Encounter for immunization: Secondary | ICD-10-CM | POA: Diagnosis not present

## 2021-08-05 DIAGNOSIS — Z0000 Encounter for general adult medical examination without abnormal findings: Secondary | ICD-10-CM | POA: Diagnosis not present

## 2021-08-05 DIAGNOSIS — E785 Hyperlipidemia, unspecified: Secondary | ICD-10-CM | POA: Diagnosis not present

## 2021-08-29 DIAGNOSIS — H2513 Age-related nuclear cataract, bilateral: Secondary | ICD-10-CM | POA: Diagnosis not present

## 2021-08-29 DIAGNOSIS — H524 Presbyopia: Secondary | ICD-10-CM | POA: Diagnosis not present

## 2022-01-10 DIAGNOSIS — L299 Pruritus, unspecified: Secondary | ICD-10-CM | POA: Diagnosis not present

## 2022-01-10 DIAGNOSIS — J310 Chronic rhinitis: Secondary | ICD-10-CM | POA: Diagnosis not present

## 2022-01-10 DIAGNOSIS — H9313 Tinnitus, bilateral: Secondary | ICD-10-CM | POA: Diagnosis not present

## 2022-03-10 DIAGNOSIS — J4521 Mild intermittent asthma with (acute) exacerbation: Secondary | ICD-10-CM | POA: Diagnosis not present

## 2022-03-10 DIAGNOSIS — J209 Acute bronchitis, unspecified: Secondary | ICD-10-CM | POA: Diagnosis not present

## 2022-03-27 DIAGNOSIS — Z87898 Personal history of other specified conditions: Secondary | ICD-10-CM | POA: Diagnosis not present

## 2022-03-27 DIAGNOSIS — Z9884 Bariatric surgery status: Secondary | ICD-10-CM | POA: Diagnosis not present

## 2022-03-27 DIAGNOSIS — I10 Essential (primary) hypertension: Secondary | ICD-10-CM | POA: Diagnosis not present

## 2022-03-27 DIAGNOSIS — M159 Polyosteoarthritis, unspecified: Secondary | ICD-10-CM | POA: Diagnosis not present

## 2022-04-10 DIAGNOSIS — I2510 Atherosclerotic heart disease of native coronary artery without angina pectoris: Secondary | ICD-10-CM | POA: Diagnosis not present

## 2022-04-10 DIAGNOSIS — I5022 Chronic systolic (congestive) heart failure: Secondary | ICD-10-CM | POA: Diagnosis not present

## 2022-04-10 DIAGNOSIS — J4520 Mild intermittent asthma, uncomplicated: Secondary | ICD-10-CM | POA: Diagnosis not present

## 2022-04-10 DIAGNOSIS — I10 Essential (primary) hypertension: Secondary | ICD-10-CM | POA: Diagnosis not present

## 2022-04-11 DIAGNOSIS — Z79899 Other long term (current) drug therapy: Secondary | ICD-10-CM | POA: Diagnosis not present

## 2022-04-11 DIAGNOSIS — R739 Hyperglycemia, unspecified: Secondary | ICD-10-CM | POA: Diagnosis not present

## 2022-04-11 DIAGNOSIS — E782 Mixed hyperlipidemia: Secondary | ICD-10-CM | POA: Diagnosis not present

## 2022-04-11 DIAGNOSIS — E559 Vitamin D deficiency, unspecified: Secondary | ICD-10-CM | POA: Diagnosis not present

## 2022-04-23 ENCOUNTER — Other Ambulatory Visit: Payer: Self-pay | Admitting: *Deleted

## 2022-04-23 ENCOUNTER — Other Ambulatory Visit: Payer: Self-pay | Admitting: Family Medicine

## 2022-04-23 DIAGNOSIS — Z1231 Encounter for screening mammogram for malignant neoplasm of breast: Secondary | ICD-10-CM

## 2022-05-06 ENCOUNTER — Encounter (HOSPITAL_COMMUNITY): Payer: Self-pay

## 2022-05-06 ENCOUNTER — Ambulatory Visit (HOSPITAL_COMMUNITY)
Admission: RE | Admit: 2022-05-06 | Discharge: 2022-05-06 | Disposition: A | Discharge status: Home / Self Care | Payer: Medicare Other | Source: Ambulatory Visit | Attending: Student | Admitting: Student

## 2022-05-06 VITALS — BP 119/90 | HR 71 | Temp 98.2°F | Resp 18

## 2022-05-06 DIAGNOSIS — M7522 Bicipital tendinitis, left shoulder: Secondary | ICD-10-CM | POA: Diagnosis not present

## 2022-05-06 DIAGNOSIS — R7303 Prediabetes: Secondary | ICD-10-CM | POA: Diagnosis not present

## 2022-05-06 MED ORDER — PREDNISONE 20 MG PO TABS: 40.0000 mg | ORAL_TABLET | Freq: Every day | ORAL | 0 refills | Status: AC

## 2022-05-06 MED ORDER — TIZANIDINE HCL 2 MG PO TABS: 2.0000 mg | ORAL_TABLET | Freq: Three times a day (TID) | ORAL | 0 refills | Status: DC | PRN

## 2022-05-06 NOTE — ED Triage Notes (Signed)
Pt c/o lt upper arm pain, tenderness to touch, and muscle spasms for over 2 months. States feels heavy to lift. Denies pain radiating. Denies injury. Took tylenol with no relief.

## 2022-05-06 NOTE — ED Provider Notes (Signed)
[place]    CSN: [id number] Arrival date & time: 05/06/22  7681      History   Chief Complaint Chief Complaint  Patient presents with   Arm Injury    Upper left arm tender to touch pain when lift at times - Entered by patient    HPI [name] [name] is a 61 y.o. female presenting with left arm pain for 2 months.  History cardiomyopathy, prediabetes, lumbar herniated disc, chron's disease.  She states that the symptoms have slowly gotten worse over the last 2 months.  The pain is tolerable at rest, but with abduction of the left arm she feels pain over the biceps muscle.  There is some stiffness but no weakness.  She is right-handed.  She denies trauma or overuse, she does not even lift groceries regularly.  She has attempted Tylenol without relief.  She denies left-sided chest pain, shortness of breath, jaw pain, weakness.  Has not followed up with her primary care about this.  HPI  Past Medical History:  Diagnosis Date   Asthma    Hypertension    Lumbar herniated disc    Pre-diabetes    Rheumatoid arthritis ([place])    Sleep apnea    CPAP    Patient Active Problem List   Diagnosis Date Noted   Pilonidal cyst 12/07/2019   Chest pain 10/27/2018   Medication management 02/17/2018   Cardiomyopathy ([place]) 10/02/2017   Morbid obesity with BMI of 45.0-49.9, adult ([place]) 10/02/2017   Pulmonary hypertension, unspecified ([place]) 10/02/2017   Rheumatoid arthritis ([place]) 10/02/2017   Acute CHF (congestive heart failure) ([place]) 08/09/2017   Prediabetes 09/29/2016   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   Obstructive sleep apnea 09/18/2016   Arthritis associated with another disorder 05/10/2016   Itching-Bilateral dorsum foot 07/11/2015   Pain in joint, ankle and foot 07/11/2015   Foot swelling 07/11/2015   FUO (fever of unknown origin) 03/14/2012   Hypokalemia 03/14/2012   Hyponatremia 03/14/2012   Dehydration 03/14/2012   LBBB (left bundle branch  block) 03/14/2012   Obesity, morbid ([place]) 03/07/2008   Essential hypertension 03/07/2008   CROHNS DISEASE 03/07/2008   ABSCESS, PERIRECTAL 03/07/2008    Past Surgical History:  Procedure Laterality Date   BREAST SURGERY     breast reduction    LAPAROSCOPIC GASTRIC SLEEVE RESECTION N/A 09/29/2016   Procedure: LAPAROSCOPIC GASTRIC SLEEVE RESECTION, UPPER ENDO;  Surgeon: Greer Pickerel, MD;  Location: [facility];  Service: General;  Laterality: N/A;   REDUCTION MAMMAPLASTY Bilateral 2003   removed bone from left knee      UPPER GI ENDOSCOPY  09/29/2016   Procedure: UPPER GI ENDOSCOPY;  Surgeon: Greer Pickerel, MD;  Location: [facility];  Service: General;;    OB History   No obstetric history on file.      Home Medications    Prior to Admission medications   Medication Sig Start Date End Date Taking? Authorizing Provider  predniSONE (DELTASONE) 20 MG tablet Take 2 tablets (40 mg total) by mouth daily for 5 days. Take with breakfast or lunch. Avoid NSAIDs (ibuprofen, etc) while taking this medication. 05/06/22 05/11/22 Yes Hazel Sams, PA-C  tiZANidine (ZANAFLEX) 2 MG tablet Take 1 tablet (2 mg total) by mouth every 8 (eight) hours as needed for muscle spasms. 05/06/22  Yes Hazel Sams, PA-C  acetaminophen (TYLENOL) 500 MG tablet Take 500-1,000 mg by mouth every 6 (six) hours as needed for mild pain or headache.    [provider]  carvedilol (COREG) 12.5 MG tablet Take 1 tablet (12.5 mg total) by mouth 2 (two) times daily with a meal. 12/10/17   Minus Breeding, MD  furosemide (LASIX) 40 MG tablet Take 1 tablet (40 mg total) by mouth daily. KEEP OV. 07/30/18   Minus Breeding, MD  montelukast (SINGULAIR) 10 MG tablet Take 10 mg by mouth daily. 03/27/20   [provider]  olmesartan (BENICAR) 20 MG tablet Take 1 tablet (20 mg total) by mouth daily. 12/06/18   Deneise Lever, MD  valACYclovir (VALTREX) 500 MG tablet TAKE 1 TABLET BY MOUTH EVERY DAY Patient taking differently: Take  500 mg by mouth as needed (flare up).  06/14/15   [place] Bayley, DO    Family History Family History  Problem Relation Age of Onset   Ovarian cancer Mother 95       Not sure of this diagnosis   Diabetes Brother    Hypertension Brother    Breast cancer Neg Hx     Social History Social History   Tobacco Use   Smoking status: Never   Smokeless tobacco: Never  Vaping Use   Vaping Use: Never used  Substance Use Topics   Alcohol use: No    Alcohol/week: 0.0 standard drinks of alcohol   Drug use: No     Allergies   Penicillins, Amoxicillin, Tetanus-diphth-acell pertussis, and Tdap [tetanus-diphth-acell pertussis]   Review of Systems Review of Systems  Musculoskeletal:        L arm pain   All other systems reviewed and are negative.    Physical Exam Triage Vital Signs ED Triage Vitals [05/06/22 0957]  Enc Vitals Group     BP 119/90     Pulse Rate 71     Resp 18     Temp 98.2 F (36.8 C)     Temp Source Oral     SpO2 94 %     Weight      Height      Head Circumference      Peak Flow      Pain Score 7     Pain Loc      Pain Edu?      Excl. in [place]?    No data found.  Updated Vital Signs BP 119/90 (BP Location: Left Arm)   Pulse 71   Temp 98.2 F (36.8 C) (Oral)   Resp 18   SpO2 94%   Visual Acuity Right Eye Distance:   Left Eye Distance:   Bilateral Distance:    Right Eye Near:   Left Eye Near:    Bilateral Near:     Physical Exam Vitals reviewed.  Constitutional:      General: She is not in acute distress.    Appearance: Normal appearance. She is not ill-appearing.  HENT:     Head: Normocephalic and atraumatic.  Pulmonary:     Effort: Pulmonary effort is normal.  Musculoskeletal:     Comments: L arm - no skin changes or swelling. No venous distension. There is tenderness to palpation along the biceps muscle and proximal biceps tendon. No bony or joint tenderness. ROM abduction and crossbody abduction intact but with pain. Grip strength 5/5.    Neurological:     General: No focal deficit present.     Mental Status: She is alert and oriented to person, place, and time.  Psychiatric:        Mood and Affect: Mood normal.  Behavior: Behavior normal.        Thought Content: Thought content normal.        Judgment: Judgment normal.      UC Treatments / Results  Labs (all labs ordered are listed, but only abnormal results are displayed) Labs Reviewed - No data to display  EKG   Radiology No results found.  Procedures Procedures (including critical care time)  Medications Ordered in UC Medications - No data to display  Initial Impression / Assessment and Plan / UC Course  I have reviewed the triage vital signs and the nursing notes.  Pertinent labs & imaging results that were available during my care of the patient were reviewed by me and considered in my medical decision making (see chart for details).     This patient is a very pleasant 61 y.o. year old female presenting with biceps tendonitis. Neurovascularly intact, no overuse or trauma. The pain is reproducible and there is no arm or chest pain at rest. She is prediabetic, last A1c 6.1 per pt. Prednisone and zanaflex sent. F/u with PCP for further concerns. ED return precautions discussed. Patient verbalizes understanding and agreement.   Final Clinical Impressions(s) / UC Diagnoses   Final diagnoses:  Biceps tendonitis on left  Prediabetes     Discharge Instructions      -Prednisone, 2 pills taken at the same time for 5 days in a row.  Try taking this earlier in the day as it can give you energy. Avoid NSAIDs like ibuprofen and alleve while taking this medication as they can increase your risk of stomach upset and even GI bleeding when in combination with a steroid. You can continue tylenol (acetaminophen) up to '1000mg'$  3x daily. -Start the muscle relaxer-Zanaflex (tizanidine), up to 3 times daily for muscle spasms and pain.  This can make you drowsy,  so take at bedtime or when you do not need to drive or operate machinery. -Follow-up with PCP or orthopedist if symptoms worsen or persist. Seek immediate medical attention if new symptoms like chest pain, shortness of breath.    ED Prescriptions     Medication Sig Dispense Auth. Provider   predniSONE (DELTASONE) 20 MG tablet Take 2 tablets (40 mg total) by mouth daily for 5 days. Take with breakfast or lunch. Avoid NSAIDs (ibuprofen, etc) while taking this medication. 10 tablet Hazel Sams, PA-C   tiZANidine (ZANAFLEX) 2 MG tablet Take 1 tablet (2 mg total) by mouth every 8 (eight) hours as needed for muscle spasms. 21 tablet Hazel Sams, PA-C      PDMP not reviewed this encounter.   Hazel Sams, PA-C 05/06/22 1052

## 2022-05-06 NOTE — Discharge Instructions (Addendum)
-  Prednisone, 2 pills taken at the same time for 5 days in a row.  Try taking this earlier in the day as it can give you energy. Avoid NSAIDs like ibuprofen and alleve while taking this medication as they can increase your risk of stomach upset and even GI bleeding when in combination with a steroid. You can continue tylenol (acetaminophen) up to '1000mg'$  3x daily. -Start the muscle relaxer-Zanaflex (tizanidine), up to 3 times daily for muscle spasms and pain.  This can make you drowsy, so take at bedtime or when you do not need to drive or operate machinery. -Follow-up with PCP or orthopedist if symptoms worsen or persist. Seek immediate medical attention if new symptoms like chest pain, shortness of breath.

## 2022-06-02 ENCOUNTER — Ambulatory Visit
Admission: RE | Admit: 2022-06-02 | Discharge: 2022-06-02 | Disposition: A | Discharge status: Home / Self Care | Payer: Medicare Other | Source: Ambulatory Visit | Attending: *Deleted | Admitting: *Deleted

## 2022-06-02 DIAGNOSIS — Z1231 Encounter for screening mammogram for malignant neoplasm of breast: Secondary | ICD-10-CM | POA: Diagnosis not present

## 2022-06-06 DIAGNOSIS — M25562 Pain in left knee: Secondary | ICD-10-CM | POA: Diagnosis not present

## 2022-06-06 DIAGNOSIS — M25561 Pain in right knee: Secondary | ICD-10-CM | POA: Diagnosis not present

## 2022-08-13 ENCOUNTER — Other Ambulatory Visit (HOSPITAL_COMMUNITY)
Admission: RE | Admit: 2022-08-13 | Discharge: 2022-08-13 | Disposition: A | Discharge status: Home / Self Care | Payer: Medicare Other | Source: Ambulatory Visit | Attending: Family Medicine | Admitting: Family Medicine

## 2022-08-13 DIAGNOSIS — R42 Dizziness and giddiness: Secondary | ICD-10-CM | POA: Diagnosis not present

## 2022-08-13 DIAGNOSIS — M069 Rheumatoid arthritis, unspecified: Secondary | ICD-10-CM | POA: Diagnosis not present

## 2022-08-13 DIAGNOSIS — Z1151 Encounter for screening for human papillomavirus (HPV): Secondary | ICD-10-CM | POA: Insufficient documentation

## 2022-08-13 DIAGNOSIS — I10 Essential (primary) hypertension: Secondary | ICD-10-CM | POA: Diagnosis not present

## 2022-08-13 DIAGNOSIS — R7303 Prediabetes: Secondary | ICD-10-CM | POA: Diagnosis not present

## 2022-08-13 DIAGNOSIS — Z0000 Encounter for general adult medical examination without abnormal findings: Secondary | ICD-10-CM | POA: Diagnosis not present

## 2022-08-13 DIAGNOSIS — Z23 Encounter for immunization: Secondary | ICD-10-CM | POA: Diagnosis not present

## 2022-08-13 DIAGNOSIS — I5022 Chronic systolic (congestive) heart failure: Secondary | ICD-10-CM | POA: Diagnosis not present

## 2022-08-13 DIAGNOSIS — Z01419 Encounter for gynecological examination (general) (routine) without abnormal findings: Secondary | ICD-10-CM | POA: Diagnosis present

## 2022-08-13 DIAGNOSIS — I7090 Unspecified atherosclerosis: Secondary | ICD-10-CM | POA: Diagnosis not present

## 2022-08-13 DIAGNOSIS — E785 Hyperlipidemia, unspecified: Secondary | ICD-10-CM | POA: Diagnosis not present

## 2022-08-14 DIAGNOSIS — E785 Hyperlipidemia, unspecified: Secondary | ICD-10-CM | POA: Diagnosis not present

## 2022-08-14 DIAGNOSIS — I5022 Chronic systolic (congestive) heart failure: Secondary | ICD-10-CM | POA: Diagnosis not present

## 2022-08-14 DIAGNOSIS — R7303 Prediabetes: Secondary | ICD-10-CM | POA: Diagnosis not present

## 2022-08-14 DIAGNOSIS — I10 Essential (primary) hypertension: Secondary | ICD-10-CM | POA: Diagnosis not present

## 2022-08-15 LAB — CYTOLOGY - PAP
Comment: NEGATIVE
Diagnosis: NEGATIVE
High risk HPV: NEGATIVE

## 2022-08-25 ENCOUNTER — Encounter (INDEPENDENT_AMBULATORY_CARE_PROVIDER_SITE_OTHER): Payer: Self-pay | Admitting: Internal Medicine

## 2022-08-25 ENCOUNTER — Ambulatory Visit (INDEPENDENT_AMBULATORY_CARE_PROVIDER_SITE_OTHER): Payer: Medicare Other | Admitting: Internal Medicine

## 2022-08-25 VITALS — BP 127/84 | HR 73 | Temp 98.0°F | Ht 62.0 in | Wt 267.0 lb

## 2022-08-25 DIAGNOSIS — R7303 Prediabetes: Secondary | ICD-10-CM

## 2022-08-25 DIAGNOSIS — Z6841 Body Mass Index (BMI) 40.0 and over, adult: Secondary | ICD-10-CM

## 2022-08-25 DIAGNOSIS — R5383 Other fatigue: Secondary | ICD-10-CM

## 2022-08-25 DIAGNOSIS — Z9884 Bariatric surgery status: Secondary | ICD-10-CM

## 2022-08-25 DIAGNOSIS — Z2089 Contact with and (suspected) exposure to other communicable diseases: Secondary | ICD-10-CM

## 2022-08-25 DIAGNOSIS — I10 Essential (primary) hypertension: Secondary | ICD-10-CM

## 2022-08-25 DIAGNOSIS — G4733 Obstructive sleep apnea (adult) (pediatric): Secondary | ICD-10-CM

## 2022-08-25 DIAGNOSIS — Z7409 Other reduced mobility: Secondary | ICD-10-CM

## 2022-08-25 NOTE — Progress Notes (Signed)
Office: [phone]  /  Fax: [phone]  Initial Visit  [place] was seen in clinic today to evaluate for obesity. She is interested in losing weight to improve overall health and reduce the risk of weight related complications.  She was referred by her orthopedic surgeon for weight management as patient is in need of bilateral knee replacements.  She reports having a history of gastric sleeve surgery in 2016.  Her presurgical weight was 297 pounds.  Current weight is 267.  She is not following a particular nutritional plan and is sedentary.  Associated conditions include high blood pressure, heart failure, OSA, and prediabetes.  She endorses problems with impaired mobility, poor endurance, fatigue and some exertional shortness of breath.  Even though she was not sure as to why she was here she is interested in losing weight.  She attributes her weight gain to stress and emotional eating.  She presents today to review program treatment options, initial physical assessment, and evaluation.      Past medical history includes:   Past Medical History:  Diagnosis Date   Asthma    Hypertension    Lumbar herniated disc    Pre-diabetes    Rheumatoid arthritis ([place])    Sleep apnea    CPAP     Objective:   BP 127/84   Pulse 73   Temp 98 F (36.7 C)   Ht '5\' 2"'$  (1.575 m)   Wt 267 lb (121.1 kg)   SpO2 98%   BMI 48.83 kg/m  She was weighed on the bioimpedance scale:  Body mass index is 48.83 kg/m.  General:  Alert, oriented and cooperative. Patient is in no acute distress.  Respiratory: Normal respiratory effort, no problems with respiration noted  Extremities: Normal range of motion.    Mental Status: Normal mood and affect. Normal behavior. Normal judgment and thought content.   Assessment and Plan:  1. Morbid obesity with BMI of 45.0-49.9, adult ([place])  2. Other fatigue  3. Prediabetes  4. Obstructive sleep apnea  5. Impaired mobility and  endurance  6. S/P laparoscopic sleeve gastrectomy  7. Primary hypertension   We reviewed weight, associated conditions, contributing factors and the health benefits of losing 10 to 15% body weight.  She has regained weight post gastric bypass due to metabolic adaptations as well as lifestyle.  She would like to have bilateral knee replacements and seems motivated to start her weight loss journey.  I explained to her that this may improve not only her candidacy but also her prediabetes, high blood pressure, fatigue and obstructive sleep apnea.  We reviewed the benefits of a reduced calorie high-protein meal plan which should help with some of her hunger signals.  Her emotional eating behaviors also need to be addressed via counseling.  Her blood pressure is adequately controlled for age and risk factors.  She will continue on carvedilol and ARB.    Obesity Treatment Plan:  She will work on garnering support from family and friends to begin weight loss journey. Work on eliminating or reducing the presence of highly processed, calorie dense foods in the home. Complete provided nutritional and psychosocial assessment questionnaire.    [place] will follow up in the next 1-2 weeks to review the above steps and continue evaluation and treatment.  Obesity Education Performed Today:  She was weighed on the bioimpedance scale and results were discussed and documented in the synopsis.  We discussed obesity as a disease and the importance  of a more detailed evaluation of all the factors contributing to the disease.  We discussed the importance of long term lifestyle changes which include nutrition, exercise and behavioral modifications as well as the importance of customizing this to her specific health and social needs.  We discussed the benefits of reaching a healthier weight to alleviate the symptoms of existing conditions and reduce the risks of the biomechanical, metabolic and  psychological effects of obesity.  We discussed the goals of this program is to improve her overall health and not simply achieve a specific BMI.  Frequent visits are very important to patient success. I plan to see her every 2 weeks for the first 3 months and then evaluate the visit frequency after that time. I explained obesity is a life-long chronic disease and long term treatments would be required. Medications to help her follow his eating plan may be offered as appropriate but are not required. All medication decisions will be made together after the initial workup is done and benefits and side effects are discussed in depth.  The clinic rules were reviewed including the late policy, cancellation policy, no show and program fees.  [place] appears to be in the action stage of change and states they are ready to start intensive lifestyle modifications and behavioral modifications.  30 minutes was spent today on this visit including the above counseling, pre-visit chart review, and post-visit documentation.  Thomes Dinning, MD

## 2022-08-27 DIAGNOSIS — H2513 Age-related nuclear cataract, bilateral: Secondary | ICD-10-CM | POA: Diagnosis not present

## 2022-09-02 ENCOUNTER — Encounter (INDEPENDENT_AMBULATORY_CARE_PROVIDER_SITE_OTHER): Payer: Self-pay | Admitting: Internal Medicine

## 2022-09-02 ENCOUNTER — Ambulatory Visit (INDEPENDENT_AMBULATORY_CARE_PROVIDER_SITE_OTHER): Payer: Medicare Other | Admitting: Internal Medicine

## 2022-09-02 VITALS — BP 130/86 | HR 78 | Temp 98.1°F | Ht 63.0 in | Wt 269.8 lb

## 2022-09-02 DIAGNOSIS — I10 Essential (primary) hypertension: Secondary | ICD-10-CM | POA: Insufficient documentation

## 2022-09-02 DIAGNOSIS — R7303 Prediabetes: Secondary | ICD-10-CM

## 2022-09-02 DIAGNOSIS — E669 Obesity, unspecified: Secondary | ICD-10-CM

## 2022-09-02 DIAGNOSIS — I110 Hypertensive heart disease with heart failure: Secondary | ICD-10-CM | POA: Diagnosis not present

## 2022-09-02 DIAGNOSIS — R0602 Shortness of breath: Secondary | ICD-10-CM | POA: Diagnosis not present

## 2022-09-02 DIAGNOSIS — G4733 Obstructive sleep apnea (adult) (pediatric): Secondary | ICD-10-CM | POA: Diagnosis not present

## 2022-09-02 DIAGNOSIS — R5383 Other fatigue: Secondary | ICD-10-CM | POA: Diagnosis not present

## 2022-09-02 DIAGNOSIS — Z1331 Encounter for screening for depression: Secondary | ICD-10-CM | POA: Diagnosis not present

## 2022-09-02 DIAGNOSIS — I509 Heart failure, unspecified: Secondary | ICD-10-CM | POA: Diagnosis not present

## 2022-09-02 DIAGNOSIS — Z6841 Body Mass Index (BMI) 40.0 and over, adult: Secondary | ICD-10-CM

## 2022-09-02 DIAGNOSIS — E559 Vitamin D deficiency, unspecified: Secondary | ICD-10-CM

## 2022-09-03 DIAGNOSIS — R42 Dizziness and giddiness: Secondary | ICD-10-CM | POA: Diagnosis not present

## 2022-09-03 DIAGNOSIS — I5022 Chronic systolic (congestive) heart failure: Secondary | ICD-10-CM | POA: Diagnosis not present

## 2022-09-03 DIAGNOSIS — I10 Essential (primary) hypertension: Secondary | ICD-10-CM | POA: Diagnosis not present

## 2022-09-03 LAB — INSULIN, RANDOM: INSULIN: 9.1 u[IU]/mL (ref 2.6–24.9)

## 2022-09-10 NOTE — Progress Notes (Signed)
Chief Complaint:   OBESITY [name] [name] (MR# [id number]) is a 62 y.o. female who presents for evaluation and treatment of obesity and related comorbidities. Current BMI is Body mass index is 47.79 kg/m. [name] [name] has been struggling with her weight for many years and has been unsuccessful in either losing weight, maintaining weight loss, or reaching her healthy weight goal.  [name] [name] is currently in the action stage of change and ready to dedicate time achieving and maintaining a healthier weight. [name] [name] is interested in becoming our patient and working on intensive lifestyle modifications including (but not limited to) diet and exercise for weight loss.  [name] [name]'s habits were reviewed today and are as follows: her desired weight loss is 79 lbs, she has been heavy most of her life, she started gaining weight in 1984, her heaviest weight ever was 297 pounds, she has significant food cravings issues, she snacks frequently in the evenings, she skips meals frequently, she is frequently drinking liquids with calories, she frequently makes poor food choices, she has problems with excessive hunger, she frequently eats larger portions than normal, she has binge eating behaviors, and she struggles with emotional eating.  Depression Screen [name] [name] Food and Mood (modified PHQ-9) score was 18.     09/02/2022    8:21 AM  Depression screen PHQ 2/9  Decreased Interest 3  Down, Depressed, Hopeless 2  PHQ - 2 Score 5  Altered sleeping 2  Tired, decreased energy 3  Change in appetite 3  Feeling bad or failure about yourself  1  Trouble concentrating 1  Moving slowly or fidgety/restless 3  Suicidal thoughts 0  PHQ-9 Score 18  Difficult doing work/chores Extremely dIfficult   Subjective:   1. Other fatigue [place] admits to daytime somnolence and denies waking up still tired. Patient has a history of symptoms of daytime fatigue. Caitlin generally gets 6 or 8 hours of sleep per  night, and states that she has generally restful sleep. Snoring is present. Apneic episodes are present. Epworth Sleepiness Score is 4. EKG condensing of ORS, chronic and unchanged, normal sinus rhythm.   2. SOB (shortness of breath) [name] [name] notes increasing shortness of breath with exercising and seems to be worsening over time with weight gain. She notes getting out of breath sooner with activity than she used to. This has not gotten worse recently. [name] [name] denies shortness of breath at rest or orthopnea.  3. Prediabetes [name] [name] last A1c was 6.2 (2017) and 6.2 (2023). Her fasting blood glucose was 97. I discussed labs with the patient today.   4. Hypertension, essential Shenia's GFR was 88 (07/2022 [place]). [facility]. Her blood pressure is slightly above target of <130/80.  5. Congestive heart failure, unspecified HF chronicity, unspecified heart failure type ([place]) [name] [name] CHF is with improved EF on Echocardiogram. [name] [name] on exam. She is on ARB, Beta blocker, diuretic. Reviewed CMP, electrolytes within normal limits.   6. Obstructive sleep apnea Rolando's OSA is untreated due to mask problems. Elevated CO2 on labs. Reviewed risks, symptoms associated with untreated OSA. Suspect patient in bicarb due to hypoventilation and CO2 retention.   7. Vitamin D deficiency [name] [name] was 17.11 (2017), and she is not on treatment. I discussed labs with the patient today.   Assessment/Plan:   1. Other fatigue [name] [name] does feel that her weight is causing her energy to be lower than it should be. Fatigue may be related to obesity, depression or many other causes. Labs will be  ordered, and in the meanwhile, [place] will focus on self care including making healthy food choices, increasing physical activity and focusing on stress reduction.  - EKG 12-Lead - CBC with Differential/Platelet; Future - TSH; Future - Vitamin B12  2. SOB (shortness of breath) [place] does  feel that she gets out of breath more easily that she used to when she exercises. [name] [name] shortness of breath appears to be obesity related and exercise induced. She has agreed to work on weight loss and gradually increase exercise to treat her exercise induced shortness of breath. Will continue to monitor closely.  3. Prediabetes We will check labs today. [name] [name] will work on her weight loss therapy, increase physical activity to 150 minutes a week.   - Insulin, random  4. Hypertension, essential [name] [name] will work on her weight loss therapy, continue her BB and ARB, and will continue to monitor at home.   5. Congestive heart failure, unspecified HF chronicity, unspecified heart failure type (HCC) [name] [name] will work on her weight loss therapy, low sodium diet, and continue her medications.   6. Obstructive sleep apnea On labs she has an elevated bicarb.  I suspect she may have a degree of hypoventilation.  [name] [name] was advised to discuss with her PCP about PAP therapy.   7. Vitamin D deficiency We will check labs today, and will replenish for a goal of 50-60.  - VITAMIN D 25 Hydroxy (Vit-D Deficiency, Fractures); Future  8. Depression screening [name] [name] had a positive depression screening. Depression is commonly associated with obesity and often results in emotional eating behaviors. We will monitor this closely and work on CBT to help improve the non-hunger eating patterns. Referral to Psychology may be required if no improvement is seen as she continues in our clinic.  9. Class 3 severe obesity without serious comorbidity with body mass index (BMI) of 45.0 to 49.9 in adult, unspecified obesity type (HCC) [name] [name] is currently in the action stage of change and her goal is to continue with weight loss efforts. I recommend [name] [name] begin the structured treatment plan as follows:  She has agreed to the Category 2 Plan.  Patient does not eat fruits or vegetables, but she will make an  effort.  Exercise goals: For substantial health benefits, adults should do at least 150 minutes (2 hours and 30 minutes) a week of moderate-intensity, or 75 minutes (1 hour and 15 minutes) a week of vigorous-intensity aerobic physical activity, or an equivalent combination of moderate- and vigorous-intensity aerobic activity. Aerobic activity should be performed in episodes of at least 10 minutes, and preferably, it should be spread throughout the week.   Behavioral modification strategies: increasing lean protein intake, decreasing simple carbohydrates, increasing water intake, no skipping meals, meal planning and cooking strategies, keeping healthy foods in the home, avoiding temptations, and planning for success.  She was informed of the importance of frequent follow-up visits to maximize her success with intensive lifestyle modifications for her multiple health conditions. She was informed we would discuss her lab results at her next visit unless there is a critical issue that needs to be addressed sooner. [name] [name] agreed to keep her next visit at the agreed upon time to discuss these results.  Objective:   Blood pressure 130/86, pulse 78, temperature 98.1 F (36.7 C), height '5\' 3"'$  (1.6 m), weight 269 lb 12.8 oz (122.4 kg), SpO2 95 %. Body mass index is 47.79 kg/m.  EKG: Normal sinus rhythm, rate 71 BPM.  Indirect Calorimeter completed today shows a VO2  of 216 and a REE of 1483.  Her calculated basal metabolic rate is 9476 thus her basal metabolic rate is worse than expected.  General: Cooperative, alert, well developed, in no acute distress. HEENT: Conjunctivae and lids unremarkable. Cardiovascular: Regular rhythm.  Lungs: Normal work of breathing. Neurologic: No focal deficits.   Lab Results  Component Value Date   CREATININE 0.72 04/04/2020   BUN 15 04/04/2020   NA 141 04/04/2020   K 3.6 04/04/2020   CL 103 04/04/2020   CO2 29 04/04/2020   Lab Results  Component Value Date    ALT 28 09/30/2016   AST 31 09/30/2016   ALKPHOS 74 09/30/2016   BILITOT 0.3 09/30/2016   Lab Results  Component Value Date   HGBA1C 6.2 (H) 09/25/2016   HGBA1C 6.1 06/06/2014   Lab Results  Component Value Date   INSULIN 9.1 09/02/2022   Lab Results  Component Value Date   TSH 1.23 08/07/2016   No results found for: "CHOL", "HDL", "LDLCALC", "LDLDIRECT", "TRIG", "CHOLHDL" Lab Results  Component Value Date   WBC 5.7 04/04/2020   HGB 12.2 04/04/2020   HCT 37.1 04/04/2020   MCV 90.5 04/04/2020   PLT 241 04/04/2020   No results found for: "IRON", "TIBC", "FERRITIN"  Attestation Statements:   Reviewed by clinician on day of visit: allergies, medications, problem list, medical history, surgical history, family history, social history, and previous encounter notes.  Time spent on visit including pre-visit chart review and post-visit charting and care was 40 minutes.   Wilhemena Durie, am acting as transcriptionist for Thomes Dinning, MD.  I have reviewed the above documentation for accuracy and completeness, and I agree with the above. -Thomes Dinning, MD

## 2022-09-16 ENCOUNTER — Encounter (INDEPENDENT_AMBULATORY_CARE_PROVIDER_SITE_OTHER): Payer: Self-pay | Admitting: Internal Medicine

## 2022-09-16 ENCOUNTER — Ambulatory Visit (INDEPENDENT_AMBULATORY_CARE_PROVIDER_SITE_OTHER): Payer: Medicare Other | Admitting: Internal Medicine

## 2022-09-16 VITALS — BP 147/83 | HR 75 | Temp 98.0°F | Ht 63.0 in | Wt 269.4 lb

## 2022-09-16 DIAGNOSIS — I10 Essential (primary) hypertension: Secondary | ICD-10-CM | POA: Diagnosis not present

## 2022-09-16 DIAGNOSIS — R5383 Other fatigue: Secondary | ICD-10-CM

## 2022-09-16 DIAGNOSIS — E669 Obesity, unspecified: Secondary | ICD-10-CM | POA: Diagnosis not present

## 2022-09-16 DIAGNOSIS — M069 Rheumatoid arthritis, unspecified: Secondary | ICD-10-CM | POA: Diagnosis not present

## 2022-09-16 DIAGNOSIS — R7303 Prediabetes: Secondary | ICD-10-CM

## 2022-09-16 DIAGNOSIS — Z6841 Body Mass Index (BMI) 40.0 and over, adult: Secondary | ICD-10-CM

## 2022-09-17 LAB — CBC WITH DIFFERENTIAL
Basophils Absolute: 0.1 10*3/uL (ref 0.0–0.2)
Basos: 1 %
EOS (ABSOLUTE): 0.1 10*3/uL (ref 0.0–0.4)
Eos: 2 %
Hematocrit: 37.1 % (ref 34.0–46.6)
Hemoglobin: 12.2 g/dL (ref 11.1–15.9)
Immature Grans (Abs): 0 10*3/uL (ref 0.0–0.1)
Immature Granulocytes: 1 %
Lymphocytes Absolute: 1.5 10*3/uL (ref 0.7–3.1)
Lymphs: 25 %
MCH: 28.8 pg (ref 26.6–33.0)
MCHC: 32.9 g/dL (ref 31.5–35.7)
MCV: 88 fL (ref 79–97)
Monocytes Absolute: 0.5 10*3/uL (ref 0.1–0.9)
Monocytes: 8 %
Neutrophils Absolute: 3.9 10*3/uL (ref 1.4–7.0)
Neutrophils: 63 %
RBC: 4.24 x10E6/uL (ref 3.77–5.28)
RDW: 13.1 % (ref 11.7–15.4)
WBC: 6.1 10*3/uL (ref 3.4–10.8)

## 2022-09-17 LAB — TSH: TSH: 1.51 u[IU]/mL (ref 0.450–4.500)

## 2022-09-17 LAB — VITAMIN D 25 HYDROXY (VIT D DEFICIENCY, FRACTURES): Vit D, 25-Hydroxy: 29.2 ng/mL — ABNORMAL LOW (ref 30.0–100.0)

## 2022-09-22 DIAGNOSIS — M25561 Pain in right knee: Secondary | ICD-10-CM | POA: Diagnosis not present

## 2022-09-22 DIAGNOSIS — M25562 Pain in left knee: Secondary | ICD-10-CM | POA: Diagnosis not present

## 2022-09-24 NOTE — Progress Notes (Signed)
Chief Complaint:   OBESITY [name] [name] is here to discuss her progress with her obesity treatment plan along with follow-up of her obesity related diagnoses. [name] [name] is on the Category 2 Plan and states she is following her eating plan approximately 50% of the time. [name] [name] states she is doing 0 minutes 0 times per week.  Today's visit was #: 2 Starting weight: 269 lbs Starting date: 09/02/2022 Today's weight: 269 lbs Today's date: 09/16/2022 Total lbs lost to date: 0 Total lbs lost since last in-office visit: 0  Interim History: [name] [name] has made improvements in snacking healthier.  She tried the meal plan for 2 days but because of a lapse and "all or none" mindset she stopped.  She has been reading food labels and she is more cognizant of calories and she is avoiding added sugars.  Her days right now are unpredictable due to her husband with medical needs, and her son and grandchildren moving in.  Subjective:   1. Other fatigue [name] [name] notes fatigue, and labs will be ordered today.   2. Prediabetes [name] [name]'s last A1c was 6.2 and recent insulin 9.1. [name] [name] has a diagnosis of prediabetes based on her elevated HgA1c and was informed this puts her at greater risk of developing diabetes. She continues to work on diet and exercise to decrease her risk of diabetes. She denies nausea or hypoglycemia. I discussed labs with the patient today.   3. Rheumatoid arthritis, involving unspecified site, unspecified whether rheumatoid factor present ([name] [name]) [name] [name] is symptomatic with morning stiffness and knee pain, and this is altering her mobility.  Assessment/Plan:   1. Other fatigue We will check labs today, and [name] [name] will continue with  her weight loss therapy.   2. Prediabetes [name] [name] will continue with her weight loss therapy.   3. Rheumatoid arthritis, involving unspecified site, unspecified whether rheumatoid factor present ([name] [name]) [name] [name] will continue biologics per her  Rheumatologist.  We discussed chair exercises for strengthening on YouTube.  4. Obesity with current BMI 47.7 [name] [name] is currently in the action stage of change. As such, her goal is to continue with weight loss efforts. She has agreed to the Category 2 Plan (gradual slow implementation).  She unfortunately has multiple caregiver roles which is affecting her ability to meal prep and implement meal plan.    I congratulated the patient on her efforts and changes that she has made.  We will check labs today.   - CBC With Differential - VITAMIN D 25 Hydroxy (Vit-D Deficiency, Fractures) - TSH  Exercise goals: Older adults should follow the adult guidelines. When older adults cannot meet the adult guidelines, they should be as physically active as their abilities and conditions will allow.  Chair exercises for 10-30 minutes 2-3 times per week.   Behavioral modification strategies: increasing water intake, no skipping meals, keeping healthy foods in the home, better snacking choices, emotional eating strategies, avoiding temptations, and planning for success.  [name] [name] has agreed to follow-up with our clinic in 2 weeks. She was informed of the importance of frequent follow-up visits to maximize her success with intensive lifestyle modifications for her multiple health conditions.   Objective:   Blood pressure (!) 147/83, pulse 75, temperature 98 F (36.7 C), height '5\' 3"'$  (1.6 m), weight 269 lb 6.4 oz (122.2 kg), SpO2 99 %. Body mass index is 47.72 kg/m.  General: Cooperative, alert, well developed, in no acute distress. HEENT: Conjunctivae and lids unremarkable. Cardiovascular: Regular rhythm.  Lungs: Normal work of breathing. Neurologic: No  focal deficits.   Lab Results  Component Value Date   CREATININE 0.72 04/04/2020   BUN 15 04/04/2020   NA 141 04/04/2020   K 3.6 04/04/2020   CL 103 04/04/2020   CO2 29 04/04/2020   Lab Results  Component Value Date   ALT 28 09/30/2016   AST  31 09/30/2016   ALKPHOS 74 09/30/2016   BILITOT 0.3 09/30/2016   Lab Results  Component Value Date   HGBA1C 6.2 (H) 09/25/2016   HGBA1C 6.1 06/06/2014   Lab Results  Component Value Date   INSULIN 9.1 09/02/2022   Lab Results  Component Value Date   TSH 1.510 09/16/2022   No results found for: "CHOL", "HDL", "LDLCALC", "LDLDIRECT", "TRIG", "CHOLHDL" Lab Results  Component Value Date   VD25OH 29.2 (L) 09/16/2022   VD25OH 17.1 (L) 09/25/2016   Lab Results  Component Value Date   WBC 6.1 09/16/2022   HGB 12.2 09/16/2022   HCT 37.1 09/16/2022   MCV 88 09/16/2022   PLT 241 04/04/2020   No results found for: "IRON", "TIBC", "FERRITIN"  Attestation Statements:   Reviewed by clinician on day of visit: allergies, medications, problem list, medical history, surgical history, family history, social history, and previous encounter notes.  Time spent on visit including pre-visit chart review and post-visit care and charting was 40 minutes.   Wilhemena Durie, am acting as transcriptionist for Thomes Dinning, MD.  I have reviewed the above documentation for accuracy and completeness, and I agree with the above. -Thomes Dinning, MD

## 2022-09-29 DIAGNOSIS — M170 Bilateral primary osteoarthritis of knee: Secondary | ICD-10-CM | POA: Diagnosis not present

## 2022-10-06 DIAGNOSIS — M170 Bilateral primary osteoarthritis of knee: Secondary | ICD-10-CM | POA: Diagnosis not present

## 2022-10-10 DIAGNOSIS — M79605 Pain in left leg: Secondary | ICD-10-CM | POA: Diagnosis not present

## 2022-10-13 DIAGNOSIS — M170 Bilateral primary osteoarthritis of knee: Secondary | ICD-10-CM | POA: Diagnosis not present

## 2022-10-24 DIAGNOSIS — I739 Peripheral vascular disease, unspecified: Secondary | ICD-10-CM | POA: Diagnosis not present

## 2022-11-06 ENCOUNTER — Ambulatory Visit: Payer: Medicare Other | Admitting: Podiatry

## 2022-11-11 DIAGNOSIS — I739 Peripheral vascular disease, unspecified: Secondary | ICD-10-CM | POA: Diagnosis not present

## 2022-11-20 ENCOUNTER — Ambulatory Visit (INDEPENDENT_AMBULATORY_CARE_PROVIDER_SITE_OTHER): Payer: Medicare Other | Admitting: Internal Medicine

## 2022-11-20 ENCOUNTER — Encounter (INDEPENDENT_AMBULATORY_CARE_PROVIDER_SITE_OTHER): Payer: Self-pay | Admitting: Internal Medicine

## 2022-11-20 VITALS — BP 149/83 | HR 102 | Temp 98.0°F | Ht 63.0 in | Wt 262.4 lb

## 2022-11-20 DIAGNOSIS — M170 Bilateral primary osteoarthritis of knee: Secondary | ICD-10-CM | POA: Diagnosis not present

## 2022-11-20 DIAGNOSIS — M069 Rheumatoid arthritis, unspecified: Secondary | ICD-10-CM | POA: Diagnosis not present

## 2022-11-20 DIAGNOSIS — E669 Obesity, unspecified: Secondary | ICD-10-CM

## 2022-11-20 DIAGNOSIS — Z6841 Body Mass Index (BMI) 40.0 and over, adult: Secondary | ICD-10-CM

## 2022-11-20 DIAGNOSIS — I10 Essential (primary) hypertension: Secondary | ICD-10-CM

## 2022-11-20 MED ORDER — CELECOXIB 200 MG PO CAPS: 200.0000 mg | ORAL_CAPSULE | Freq: Two times a day (BID) | ORAL | 0 refills | Status: DC

## 2022-11-20 NOTE — Progress Notes (Signed)
Chief Complaint:   OBESITY [name] [name] is here to discuss her progress with her obesity treatment plan along with follow-up of her obesity related diagnoses. [name] [name] is on the Category 2 Plan and states she is following her eating plan approximately 0% of the time. [name] [name] states she is doing 0 minutes 0 times per week.  Today's visit was #: 3 Starting weight: 269 lbs Starting date: 09/02/2022 Today's weight: 262 lbs Today's date: 11/20/2022 Total lbs lost to date: 7 Total lbs lost since last in-office visit: 7  Interim History: [name] [name] presents today accompanied by her husband for follow-up on weight management.  She is a 61 year old female with history of gastric sleeve surgery, OSA, diastolic heart failure, hypertension, untreated RA.  She has not implemented nutrition plan and has been eating mostly takeout over the last several weeks.  She is experiencing what she describes as 10 out of 10 pain involving her knees with impaired mobility.  As a result she is unable to walk long distances or stand for prolonged periods, therefore has not been able to cook.  She tells me that she was evaluated by [facility], had received injection therapy but despite this she did not get any pain relief.  She is using a cane and has not receiving physical therapy.  She also tells me she is in between primary care physicians and does not have a PCP appointment till March.  She is also not being treated for her RA, according to her she had a reaction to medication and has not returned to her rheumatologist since then.  She does endorse morning stiffness but denies any swelling or redness in her joints.   Subjective:   1. Primary osteoarthritis of knees, bilateral I reviewed records I do not see any recent imaging.  She tells me she has been ruled out for circulation and blood clots 3 weeks ago.  Based on physical exam she has some muscular deconditioning and changes consistent with  osteoarthritis of the knees.  There is no warmness or redness.  I conveyed to her that she needs to contact new PCP to get an appointment so they can help coordinate her orthopedic care.    2. Rheumatoid arthritis, involving unspecified site, unspecified whether rheumatoid factor present ([place]) Untreated at present, history of treatment complications in the past.    3. Hypertension, essential Blood pressure is uncontrolled, likely secondary to pain.   Assessment/Plan:   1. Primary osteoarthritis of knees, bilateral I provided her with a prescription for Celebrex 200 mg twice a day for 7 to 10 days with 0 refills.  She has an appointment scheduled at [facility] on January 9 but feels she may not be able to wait.  I advised that she may try [facility] walk-in clinic or [facility] walk-in clinic if she does not get pain relief with Celebrex and Tylenol and cannot get into see PCP.  - celecoxib (CELEBREX) 200 MG capsule; Take 1 capsule (200 mg total) by mouth 2 (two) times daily.  Dispense: 30 capsule; Refill: 0  2. Rheumatoid arthritis, involving unspecified site, unspecified whether rheumatoid factor present [facility]) She will work with the new primary care physician on establishing continuity with the rheumatologist.  We discussed the disabling nature of RA.  At present she does not have tenosynovitis and knee exam does not show any warmness or redness.  3. Hypertension, essential Patient advised to monitor blood pressure while taking NSAIDs.  Continue current blood pressure regimen.  4. Obesity with current BMI 46.5 Because of her impaired mobility and pain she feels she is unable to do meal preparation and therefore has been relying on takeout.  Her husband does not cook.  I provided her with eating out guide and we also discussed using protein drinks along with vegetables and fruit as a meal.  She has lost 7 pounds but likely due to a decrease in food intake because of cost of eating out.    [name] [name] is currently in the action stage of change. As such, her goal is to continue with weight loss efforts. She has agreed to the Category 2 Plan.   Exercise goals: No exercise has been prescribed at this time.  Behavioral modification strategies: decreasing simple carbohydrates, increasing vegetables, increasing water intake, decreasing eating out, meal planning and cooking strategies, and planning for success.  [name] [name] has agreed to follow-up with our clinic in 4 weeks. She was informed of the importance of frequent follow-up visits to maximize her success with intensive lifestyle modifications for her multiple health conditions.   Objective:   Blood pressure (!) 149/83, pulse (!) 102, temperature 98 F (36.7 C), height '5\' 3"'$  (1.6 m), weight 262 lb 6.4 oz (119 kg), SpO2 97 %. Body mass index is 46.48 kg/m.  General: Cooperative, alert, well developed, in no acute distress. HEENT: Conjunctivae and lids unremarkable. Cardiovascular: Regular rhythm.  Lungs: Normal work of breathing. Neurologic: No focal deficits.   Lab Results  Component Value Date   CREATININE 0.72 04/04/2020   BUN 15 04/04/2020   NA 141 04/04/2020   K 3.6 04/04/2020   CL 103 04/04/2020   CO2 29 04/04/2020   Lab Results  Component Value Date   ALT 28 09/30/2016   AST 31 09/30/2016   ALKPHOS 74 09/30/2016   BILITOT 0.3 09/30/2016   Lab Results  Component Value Date   HGBA1C 6.2 (H) 09/25/2016   HGBA1C 6.1 06/06/2014   Lab Results  Component Value Date   INSULIN 9.1 09/02/2022   Lab Results  Component Value Date   TSH 1.510 09/16/2022   No results found for: "CHOL", "HDL", "LDLCALC", "LDLDIRECT", "TRIG", "CHOLHDL" Lab Results  Component Value Date   VD25OH 29.2 (L) 09/16/2022   VD25OH 17.1 (L) 09/25/2016   Lab Results  Component Value Date   WBC 6.1 09/16/2022   HGB 12.2 09/16/2022   HCT 37.1 09/16/2022   MCV 88 09/16/2022   PLT 241 04/04/2020   No results found for: "IRON",  "TIBC", "FERRITIN"  Attestation Statements:   Reviewed by clinician on day of visit: allergies, medications, problem list, medical history, surgical history, family history, social history, and previous encounter notes.  Time spent on visit including pre-visit chart review and post-visit care and charting was 30 minutes.   Wilhemena Durie, am acting as transcriptionist for Thomes Dinning, MD.  I have reviewed the above documentation for accuracy and completeness, and I agree with the above. -Thomes Dinning, MD

## 2022-11-25 DIAGNOSIS — E66813 Obesity, class 3: Secondary | ICD-10-CM | POA: Insufficient documentation

## 2022-11-25 DIAGNOSIS — M170 Bilateral primary osteoarthritis of knee: Secondary | ICD-10-CM | POA: Insufficient documentation

## 2022-12-02 DIAGNOSIS — M170 Bilateral primary osteoarthritis of knee: Secondary | ICD-10-CM | POA: Diagnosis not present

## 2022-12-02 DIAGNOSIS — M25561 Pain in right knee: Secondary | ICD-10-CM | POA: Diagnosis not present

## 2022-12-02 DIAGNOSIS — M25562 Pain in left knee: Secondary | ICD-10-CM | POA: Diagnosis not present

## 2022-12-02 DIAGNOSIS — M545 Low back pain, unspecified: Secondary | ICD-10-CM | POA: Diagnosis not present

## 2022-12-05 DIAGNOSIS — R234 Changes in skin texture: Secondary | ICD-10-CM | POA: Diagnosis not present

## 2022-12-05 DIAGNOSIS — L089 Local infection of the skin and subcutaneous tissue, unspecified: Secondary | ICD-10-CM | POA: Diagnosis not present

## 2022-12-18 ENCOUNTER — Ambulatory Visit (INDEPENDENT_AMBULATORY_CARE_PROVIDER_SITE_OTHER): Payer: Medicare Other | Admitting: Internal Medicine

## 2023-01-26 DIAGNOSIS — M5136 Other intervertebral disc degeneration, lumbar region: Secondary | ICD-10-CM | POA: Diagnosis not present

## 2023-01-26 DIAGNOSIS — M5416 Radiculopathy, lumbar region: Secondary | ICD-10-CM | POA: Diagnosis not present

## 2023-02-03 DIAGNOSIS — R234 Changes in skin texture: Secondary | ICD-10-CM | POA: Diagnosis not present

## 2023-02-03 DIAGNOSIS — I10 Essential (primary) hypertension: Secondary | ICD-10-CM | POA: Diagnosis not present

## 2023-02-03 DIAGNOSIS — R7303 Prediabetes: Secondary | ICD-10-CM | POA: Diagnosis not present

## 2023-02-03 DIAGNOSIS — M1990 Unspecified osteoarthritis, unspecified site: Secondary | ICD-10-CM | POA: Diagnosis not present

## 2023-02-06 ENCOUNTER — Other Ambulatory Visit (HOSPITAL_COMMUNITY): Payer: Self-pay

## 2023-02-07 ENCOUNTER — Other Ambulatory Visit (HOSPITAL_COMMUNITY): Payer: Self-pay

## 2023-02-09 ENCOUNTER — Other Ambulatory Visit (HOSPITAL_COMMUNITY): Payer: Self-pay

## 2023-02-09 MED ORDER — MOUNJARO 2.5 MG/0.5ML ~~LOC~~ SOAJ
2.5000 mg | SUBCUTANEOUS | 0 refills | Status: DC
  Filled 2023-02-09: qty 2, 28d supply, fill #0

## 2023-02-09 MED ORDER — ALBUTEROL SULFATE (2.5 MG/3ML) 0.083% IN NEBU
3.0000 mL | INHALATION_SOLUTION | RESPIRATORY_TRACT | 2 refills | Status: AC | PRN
  Filled 2023-02-09: qty 180, 10d supply, fill #0

## 2023-02-09 MED ORDER — MELOXICAM 7.5 MG PO TABS
7.5000 mg | ORAL_TABLET | Freq: Every day | ORAL | 1 refills | Status: DC | PRN
  Filled 2023-02-09: qty 30, 30d supply, fill #0

## 2023-02-09 MED ORDER — MOUNJARO 7.5 MG/0.5ML ~~LOC~~ SOAJ
7.5000 mg | SUBCUTANEOUS | 0 refills | Status: DC
  Filled 2023-03-31: qty 2, 28d supply, fill #0

## 2023-02-09 MED ORDER — MOUNJARO 5 MG/0.5ML ~~LOC~~ SOAJ
5.0000 mg | SUBCUTANEOUS | 0 refills | Status: DC
  Filled 2023-02-09 – 2023-03-05 (×2): qty 2, 28d supply, fill #0

## 2023-02-09 MED ORDER — ALBUTEROL SULFATE HFA 108 (90 BASE) MCG/ACT IN AERS
2.0000 | INHALATION_SPRAY | RESPIRATORY_TRACT | 0 refills | Status: AC | PRN
  Filled 2023-02-09: qty 6.7, 17d supply, fill #0

## 2023-02-10 ENCOUNTER — Other Ambulatory Visit (HOSPITAL_COMMUNITY): Payer: Self-pay

## 2023-02-10 DIAGNOSIS — R7303 Prediabetes: Secondary | ICD-10-CM | POA: Diagnosis not present

## 2023-02-11 DIAGNOSIS — M5451 Vertebrogenic low back pain: Secondary | ICD-10-CM | POA: Diagnosis not present

## 2023-02-11 DIAGNOSIS — M5416 Radiculopathy, lumbar region: Secondary | ICD-10-CM | POA: Diagnosis not present

## 2023-02-23 DIAGNOSIS — M5451 Vertebrogenic low back pain: Secondary | ICD-10-CM | POA: Diagnosis not present

## 2023-02-23 DIAGNOSIS — M5416 Radiculopathy, lumbar region: Secondary | ICD-10-CM | POA: Diagnosis not present

## 2023-03-05 ENCOUNTER — Other Ambulatory Visit (HOSPITAL_COMMUNITY): Payer: Self-pay

## 2023-03-05 DIAGNOSIS — G4733 Obstructive sleep apnea (adult) (pediatric): Secondary | ICD-10-CM | POA: Diagnosis not present

## 2023-03-05 DIAGNOSIS — I5022 Chronic systolic (congestive) heart failure: Secondary | ICD-10-CM | POA: Diagnosis not present

## 2023-03-05 DIAGNOSIS — M069 Rheumatoid arthritis, unspecified: Secondary | ICD-10-CM | POA: Diagnosis not present

## 2023-03-31 ENCOUNTER — Other Ambulatory Visit (HOSPITAL_COMMUNITY): Payer: Self-pay

## 2023-04-01 ENCOUNTER — Other Ambulatory Visit (HOSPITAL_COMMUNITY): Payer: Self-pay

## 2023-04-23 ENCOUNTER — Other Ambulatory Visit: Payer: Self-pay | Admitting: Family Medicine

## 2023-04-23 DIAGNOSIS — Z1231 Encounter for screening mammogram for malignant neoplasm of breast: Secondary | ICD-10-CM

## 2023-04-28 ENCOUNTER — Other Ambulatory Visit (HOSPITAL_COMMUNITY): Payer: Self-pay

## 2023-04-28 DIAGNOSIS — N289 Disorder of kidney and ureter, unspecified: Secondary | ICD-10-CM | POA: Diagnosis not present

## 2023-04-28 DIAGNOSIS — E876 Hypokalemia: Secondary | ICD-10-CM | POA: Diagnosis not present

## 2023-04-28 DIAGNOSIS — I5022 Chronic systolic (congestive) heart failure: Secondary | ICD-10-CM | POA: Diagnosis not present

## 2023-04-28 DIAGNOSIS — I429 Cardiomyopathy, unspecified: Secondary | ICD-10-CM | POA: Diagnosis not present

## 2023-04-28 DIAGNOSIS — I10 Essential (primary) hypertension: Secondary | ICD-10-CM | POA: Diagnosis not present

## 2023-04-28 DIAGNOSIS — Z713 Dietary counseling and surveillance: Secondary | ICD-10-CM | POA: Diagnosis not present

## 2023-04-28 DIAGNOSIS — I110 Hypertensive heart disease with heart failure: Secondary | ICD-10-CM | POA: Diagnosis not present

## 2023-04-28 DIAGNOSIS — R7303 Prediabetes: Secondary | ICD-10-CM | POA: Diagnosis not present

## 2023-04-28 MED ORDER — MOUNJARO 12.5 MG/0.5ML ~~LOC~~ SOAJ
12.5000 mg | SUBCUTANEOUS | 0 refills | Status: DC
  Filled 2023-06-23: qty 2, 28d supply, fill #0

## 2023-04-28 MED ORDER — MOUNJARO 10 MG/0.5ML ~~LOC~~ SOAJ
10.0000 mg | SUBCUTANEOUS | 0 refills | Status: DC
  Filled 2023-05-26: qty 2, 28d supply, fill #0

## 2023-04-28 MED ORDER — MOUNJARO 7.5 MG/0.5ML ~~LOC~~ SOAJ
SUBCUTANEOUS | 0 refills | Status: DC
  Filled 2023-04-28: qty 2, 30d supply, fill #0

## 2023-05-05 DIAGNOSIS — M170 Bilateral primary osteoarthritis of knee: Secondary | ICD-10-CM | POA: Diagnosis not present

## 2023-05-21 DIAGNOSIS — N289 Disorder of kidney and ureter, unspecified: Secondary | ICD-10-CM | POA: Diagnosis not present

## 2023-05-26 ENCOUNTER — Other Ambulatory Visit (HOSPITAL_COMMUNITY): Payer: Self-pay

## 2023-06-05 ENCOUNTER — Ambulatory Visit
Admission: RE | Admit: 2023-06-05 | Discharge: 2023-06-05 | Disposition: A | Discharge status: Home / Self Care | Payer: Medicare Other | Source: Ambulatory Visit | Attending: Family Medicine | Admitting: Family Medicine

## 2023-06-05 DIAGNOSIS — Z1231 Encounter for screening mammogram for malignant neoplasm of breast: Secondary | ICD-10-CM

## 2023-06-09 DIAGNOSIS — M170 Bilateral primary osteoarthritis of knee: Secondary | ICD-10-CM | POA: Diagnosis not present

## 2023-06-16 DIAGNOSIS — M170 Bilateral primary osteoarthritis of knee: Secondary | ICD-10-CM | POA: Diagnosis not present

## 2023-06-23 ENCOUNTER — Other Ambulatory Visit (HOSPITAL_COMMUNITY): Payer: Self-pay

## 2023-06-23 DIAGNOSIS — M170 Bilateral primary osteoarthritis of knee: Secondary | ICD-10-CM | POA: Diagnosis not present

## 2023-07-08 ENCOUNTER — Other Ambulatory Visit (HOSPITAL_COMMUNITY): Payer: Self-pay

## 2023-07-08 MED ORDER — MOUNJARO 12.5 MG/0.5ML ~~LOC~~ SOAJ
12.5000 mg | SUBCUTANEOUS | 0 refills | Status: DC
  Filled 2023-07-08: qty 2, 28d supply, fill #0
  Filled 2023-08-14 – 2023-08-15 (×2): qty 2, 28d supply, fill #1

## 2023-07-09 ENCOUNTER — Other Ambulatory Visit (HOSPITAL_COMMUNITY): Payer: Self-pay

## 2023-07-14 ENCOUNTER — Other Ambulatory Visit (HOSPITAL_COMMUNITY): Payer: Self-pay

## 2023-08-10 DIAGNOSIS — M170 Bilateral primary osteoarthritis of knee: Secondary | ICD-10-CM | POA: Diagnosis not present

## 2023-08-15 ENCOUNTER — Other Ambulatory Visit (HOSPITAL_COMMUNITY): Payer: Self-pay

## 2023-08-17 ENCOUNTER — Encounter (HOSPITAL_COMMUNITY): Payer: Self-pay

## 2023-08-17 ENCOUNTER — Other Ambulatory Visit: Payer: Self-pay

## 2023-08-17 ENCOUNTER — Ambulatory Visit (HOSPITAL_COMMUNITY)
Admission: RE | Admit: 2023-08-17 | Discharge: 2023-08-17 | Disposition: A | Discharge status: Home / Self Care | Payer: Medicare Other | Source: Ambulatory Visit | Attending: Nurse Practitioner | Admitting: Nurse Practitioner

## 2023-08-17 VITALS — BP 106/76 | HR 99 | Temp 98.6°F | Resp 18

## 2023-08-17 DIAGNOSIS — M79672 Pain in left foot: Secondary | ICD-10-CM | POA: Insufficient documentation

## 2023-08-17 LAB — BASIC METABOLIC PANEL
Anion gap: 14 (ref 5–15)
BUN: 15 mg/dL (ref 8–23)
CO2: 27 mmol/L (ref 22–32)
Calcium: 9.6 mg/dL (ref 8.9–10.3)
Chloride: 99 mmol/L (ref 98–111)
Creatinine, Ser: 0.98 mg/dL (ref 0.44–1.00)
GFR, Estimated: >60 mL/min (ref >60)
Glucose, Bld: 99 mg/dL (ref 70–99)
Potassium: 2.8 mmol/L — ABNORMAL LOW (ref 3.5–5.1)
Sodium: 140 mmol/L (ref 135–145)

## 2023-08-17 LAB — URIC ACID: Uric Acid, Serum: 11.6 mg/dL — ABNORMAL HIGH (ref 2.5–7.1)

## 2023-08-17 MED ORDER — PREDNISONE 20 MG PO TABS: ORAL_TABLET | ORAL | 0 refills | Status: DC

## 2023-08-17 NOTE — ED Triage Notes (Signed)
Top of left foot is swollen, red and painful.  Has soaked foot, used Voltaren gel, ic, elevation, tylenol   No known injury.  Onset Thursday of pain .

## 2023-08-17 NOTE — ED Provider Notes (Signed)
[facility]    CSN: [id number] Arrival date & time: 08/17/23  1424      History   Chief Complaint Chief Complaint  Patient presents with   Appointment    2:30   Foot Pain    HPI [name] [name] is a 62 y.o. female.   Patient presents today with left dorsal foot pain that began when she woke up Thursday morning.  She denies any recent trauma, injury, fall, or other known accident involving the left foot.  Reports pain is worse with even light touch and when she is walking.  No numbness or tingling in the toes.  Has taken Tylenol, tried Epsom salt soaks, Voltaren gel, ice, and elevation without much improvement.  No fevers or nausea/vomiting since the pain began.  Denies history of gout or similar type of pain.  No recent dietary or medication changes.  Medical history significant for CHF, cardiomyopathy, prediabetes, rheumatoid arthritis.    Past Medical History:  Diagnosis Date   Asthma    CHF (congestive heart failure) (HCC)    Hypertension    Joint pain    Lumbar herniated disc    OA (osteoarthritis)    Obesity    Pre-diabetes    Rheumatoid arthritis (HCC)    Sleep apnea    CPAP   SOB (shortness of breath)     Patient Active Problem List   Diagnosis Date Noted   Primary osteoarthritis of knees, bilateral 11/25/2022   Class 3 severe obesity with serious comorbidity and body mass index (BMI) of 45.0 to 49.9 in adult (HCC) 11/25/2022   Other fatigue 09/02/2022   SOB (shortness of breath) 09/02/2022   Hypertension, essential 09/02/2022   Congestive heart failure (HCC) 09/02/2022   Vitamin D deficiency 09/02/2022   Depression screening 09/02/2022   Class 3 severe obesity without serious comorbidity with body mass index (BMI) of 45.0 to 49.9 in adult (HCC) 09/02/2022   Pilonidal cyst 12/07/2019   Chest pain 10/27/2018   Medication management 02/17/2018   Cardiomyopathy (HCC) 10/02/2017   Morbid obesity with BMI of 45.0-49.9, adult (HCC)  10/02/2017   Pulmonary hypertension, unspecified (HCC) 10/02/2017   Rheumatoid arthritis (HCC) 10/02/2017   Acute CHF (congestive heart failure) (HCC) 08/09/2017   Prediabetes 09/29/2016   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   Obstructive sleep apnea 09/18/2016   Arthritis associated with another disorder 05/10/2016   Itching-Bilateral dorsum foot 07/11/2015   Pain in joint, ankle and foot 07/11/2015   Foot swelling 07/11/2015   FUO (fever of unknown origin) 03/14/2012   Hypokalemia 03/14/2012   Hyponatremia 03/14/2012   Dehydration 03/14/2012   LBBB (left bundle branch block) 03/14/2012   Obesity, morbid (HCC) 03/07/2008   Essential hypertension 03/07/2008   CROHNS DISEASE 03/07/2008   ABSCESS, PERIRECTAL 03/07/2008    Past Surgical History:  Procedure Laterality Date   BREAST SURGERY     breast reduction    LAPAROSCOPIC GASTRIC SLEEVE RESECTION N/A 09/29/2016   Procedure: LAPAROSCOPIC GASTRIC SLEEVE RESECTION, UPPER ENDO;  Surgeon: Gaynelle Adu, MD;  Location: [facility];  Service: General;  Laterality: N/A;   REDUCTION MAMMAPLASTY Bilateral 2003   removed bone from left knee      UPPER GI ENDOSCOPY  09/29/2016   Procedure: UPPER GI ENDOSCOPY;  Surgeon: Gaynelle Adu, MD;  Location: [facility];  Service: General;;    OB History   No obstetric history on file.      Home Medications    Prior to Admission medications  Medication Sig Start Date End Date Taking? Authorizing Provider  predniSONE (DELTASONE) 20 MG tablet Take 3 tablets (60mg ) on days 1-2. Take 2 tablets (40mg ) on days 3-4. Take 1 tablet (20mg ) on days 5-6, then stop. 08/17/23  Yes Valentino Nose, NP  acetaminophen (TYLENOL) 500 MG tablet Take 500-1,000 mg by mouth every 6 (six) hours as needed for mild pain or headache.    [provider]  albuterol (PROVENTIL) (2.5 MG/3ML) 0.083% nebulizer solution Use 1 vial (3 mLs) by nebulization every 4 hours as needed for 10 days. 03/10/22     albuterol  (VENTOLIN HFA) 108 (90 Base) MCG/ACT inhaler Inhale 2 puffs into the lungs every 4 (four) hours as needed. 03/10/22     carvedilol (COREG) 12.5 MG tablet Take 1 tablet (12.5 mg total) by mouth 2 (two) times daily with a meal. 12/10/17   Rollene Rotunda, MD  celecoxib (CELEBREX) 200 MG capsule Take 1 capsule (200 mg total) by mouth 2 (two) times daily. Patient not taking: Reported on 08/17/2023 11/20/22   Worthy Rancher, MD  furosemide (LASIX) 40 MG tablet Take 1 tablet (40 mg total) by mouth daily. KEEP OV. 07/30/18   Rollene Rotunda, MD  meloxicam (MOBIC) 7.5 MG tablet Take 1 tablet (7.5 mg total) by mouth daily as needed. Patient not taking: Reported on 08/17/2023 02/03/23     montelukast (SINGULAIR) 10 MG tablet Take 10 mg by mouth daily. Patient not taking: Reported on 08/17/2023 03/27/20   [provider]  olmesartan (BENICAR) 20 MG tablet Take 1 tablet (20 mg total) by mouth daily. 12/06/18   Jetty Duhamel D, MD  tirzepatide [facility]) 10 MG/0.5ML Pen Inject 10 mg into the skin once a week. 04/28/23     tirzepatide (MOUNJARO) 12.5 MG/0.5ML Pen Inject 12.5 mg into the skin once a week. 07/07/23     tirzepatide (MOUNJARO) 2.5 MG/0.5ML Pen Inject 2.5 mg into the skin once a week. 02/03/23     tirzepatide (MOUNJARO) 5 MG/0.5ML Pen Inject 5 mg into the skin once a week. 02/03/23     tirzepatide (MOUNJARO) 7.5 MG/0.5ML Pen 7.5mg  Subcutaneous once a week 30 days 04/28/23     tiZANidine (ZANAFLEX) 2 MG tablet Take 1 tablet (2 mg total) by mouth every 8 (eight) hours as needed for muscle spasms. Patient not taking: Reported on 08/17/2023 05/06/22   Rhys Martini, PA-C  valACYclovir (VALTREX) 500 MG tablet TAKE 1 TABLET BY MOUTH EVERY DAY 06/14/15   Lenell Antu, DO    Family History Family History  Problem Relation Age of Onset   Ovarian cancer Mother 46       Not sure of this diagnosis   Obesity Mother    Diabetes Brother    Hypertension Brother    Breast cancer Neg Hx     Social  History Social History   Tobacco Use   Smoking status: Never   Smokeless tobacco: Never  Vaping Use   Vaping status: Never Used  Substance Use Topics   Alcohol use: No    Alcohol/week: 0.0 standard drinks of alcohol   Drug use: No     Allergies   Penicillins, Amoxicillin, Tetanus-diphth-acell pertussis, and Tdap [tetanus-diphth-acell pertussis]   Review of Systems Review of Systems Per HPI  Physical Exam Triage Vital Signs ED Triage Vitals  Encounter Vitals Group     BP 08/17/23 1515 106/76     Systolic BP Percentile --      Diastolic BP Percentile --  Pulse Rate 08/17/23 1515 99     Resp 08/17/23 1515 18     Temp 08/17/23 1515 98.6 F (37 C)     Temp Source 08/17/23 1515 Oral     SpO2 08/17/23 1515 96 %     Weight --      Height --      Head Circumference --      Peak Flow --      Pain Score 08/17/23 1513 10     Pain Loc --      Pain Education --      Exclude from Growth Chart --    No data found.  Updated Vital Signs BP 106/76 (BP Location: Left Arm) Comment (BP Location): large cuff  Pulse 99   Temp 98.6 F (37 C) (Oral)   Resp 18   SpO2 96%   Visual Acuity Right Eye Distance:   Left Eye Distance:   Bilateral Distance:    Right Eye Near:   Left Eye Near:    Bilateral Near:     Physical Exam Vitals and nursing note reviewed.  Constitutional:      General: She is not in acute distress.    Appearance: Normal appearance. She is not toxic-appearing.  HENT:     Mouth/Throat:     Mouth: Mucous membranes are moist.     Pharynx: Oropharynx is clear.  Pulmonary:     Effort: Pulmonary effort is normal. No respiratory distress.  Musculoskeletal:     Comments: Inspection: mild swelling to dorsal aspect of left foot in approximately area marked; no bruising, obvious deformity or redness Palpation: tender to light touch of left foot dorsally; no obvious deformities palpated ROM: Full ROM to left ankle and flexibility of foot although pain with  dorsiflexion Strength: 5/5 bilateral lower extremities Neurovascular: neurovascularly intact in bilateral lower extremities   Skin:    General: Skin is warm and dry.     Capillary Refill: Capillary refill takes less than 2 seconds.     Coloration: Skin is not jaundiced or pale.     Findings: No erythema.  Neurological:     Mental Status: She is alert and oriented to person, place, and time.  Psychiatric:        Behavior: Behavior is cooperative.      UC Treatments / Results  Labs (all labs ordered are listed, but only abnormal results are displayed) Labs Reviewed  URIC ACID  BASIC METABOLIC PANEL    EKG   Radiology No results found.  Procedures Procedures (including critical care time)  Medications Ordered in UC Medications - No data to display  Initial Impression / Assessment and Plan / UC Course  I have reviewed the triage vital signs and the nursing notes.  Pertinent labs & imaging results that were available during my care of the patient were reviewed by me and considered in my medical decision making (see chart for details).   Patient is well-appearing, normotensive, afebrile, not tachycardic, not tachypneic, oxygenating well on room air.    1. Left foot pain Without any trauma or injury to the left foot, x-ray imaging deferred No red flags, vitals are stable Suspect gout Uric acid, BMET pending Treat with prednisone, other supportive care discussed and low purine diet information given Patient has follow-up scheduled with PCP for annual physical in 1 week, recommended follow-up with PCP with no improvement insymptoms despite treatment  The patient was given the opportunity to ask questions.  All questions answered to their  satisfaction.  The patient is in agreement to this plan.    Final Clinical Impressions(s) / UC Diagnoses   Final diagnoses:  Left foot pain     Discharge Instructions      As we discussed, I think you have gout in your foot.  We  are checking blood work and will contact you tomorrow if the results are abnormal.  Start taking the prednisone as prescribed to treat pain and inflammation in your foot.  Seek care if the pain and swelling does not improve with this treatment.   ED Prescriptions     Medication Sig Dispense Auth. Provider   predniSONE (DELTASONE) 20 MG tablet Take 3 tablets (60mg ) on days 1-2. Take 2 tablets (40mg ) on days 3-4. Take 1 tablet (20mg ) on days 5-6, then stop. 12 tablet Valentino Nose, NP      PDMP not reviewed this encounter.   Valentino Nose, NP 08/17/23 [id number]

## 2023-08-17 NOTE — Discharge Instructions (Addendum)
As we discussed, I think you have gout in your foot.  We are checking blood work and will contact you tomorrow if the results are abnormal.  Start taking the prednisone as prescribed to treat pain and inflammation in your foot.  Seek care if the pain and swelling does not improve with this treatment.

## 2023-08-18 ENCOUNTER — Telehealth: Payer: Self-pay

## 2023-08-18 ENCOUNTER — Other Ambulatory Visit (HOSPITAL_COMMUNITY): Payer: Self-pay

## 2023-08-18 MED ORDER — POTASSIUM CHLORIDE CRYS ER 20 MEQ PO TBCR: 20.0000 meq | EXTENDED_RELEASE_TABLET | Freq: Every day | ORAL | 0 refills | Status: DC

## 2023-08-18 NOTE — Telephone Encounter (Signed)
Per Helaine Chess,  PA-C, "She definitely has gout, she should finish the prednisone Jessica prescribed for her. Her potassium is also very low. Please send her a prescription for Klor-Con 20 meq every day x 3 days and advise her to have her K rechecked in one week." Reviewed with patient, verified pharmacy, prescription sent.

## 2023-08-24 ENCOUNTER — Other Ambulatory Visit (HOSPITAL_COMMUNITY): Payer: Self-pay

## 2023-08-24 DIAGNOSIS — E785 Hyperlipidemia, unspecified: Secondary | ICD-10-CM | POA: Diagnosis not present

## 2023-08-24 DIAGNOSIS — I5022 Chronic systolic (congestive) heart failure: Secondary | ICD-10-CM | POA: Diagnosis not present

## 2023-08-24 DIAGNOSIS — I110 Hypertensive heart disease with heart failure: Secondary | ICD-10-CM | POA: Diagnosis not present

## 2023-08-24 DIAGNOSIS — I10 Essential (primary) hypertension: Secondary | ICD-10-CM | POA: Diagnosis not present

## 2023-08-24 DIAGNOSIS — Z0000 Encounter for general adult medical examination without abnormal findings: Secondary | ICD-10-CM | POA: Diagnosis not present

## 2023-08-24 DIAGNOSIS — M069 Rheumatoid arthritis, unspecified: Secondary | ICD-10-CM | POA: Diagnosis not present

## 2023-08-24 DIAGNOSIS — E790 Hyperuricemia without signs of inflammatory arthritis and tophaceous disease: Secondary | ICD-10-CM | POA: Diagnosis not present

## 2023-08-24 DIAGNOSIS — R7303 Prediabetes: Secondary | ICD-10-CM | POA: Diagnosis not present

## 2023-08-24 DIAGNOSIS — G4733 Obstructive sleep apnea (adult) (pediatric): Secondary | ICD-10-CM | POA: Diagnosis not present

## 2023-08-24 DIAGNOSIS — Z23 Encounter for immunization: Secondary | ICD-10-CM | POA: Diagnosis not present

## 2023-08-24 MED ORDER — MOUNJARO 10 MG/0.5ML ~~LOC~~ SOAJ
SUBCUTANEOUS | 0 refills | Status: DC
  Filled 2023-08-24: qty 2, 28d supply, fill #0
  Filled 2023-08-24: qty 6, 84d supply, fill #0
  Filled 2023-09-17: qty 2, 28d supply, fill #1
  Filled 2023-10-14: qty 2, 28d supply, fill #2

## 2023-08-24 MED ORDER — ALLOPURINOL 100 MG PO TABS
100.0000 mg | ORAL_TABLET | Freq: Every day | ORAL | 0 refills | Status: DC
  Filled 2023-08-24: qty 90, 90d supply, fill #0

## 2023-08-31 DIAGNOSIS — M25469 Effusion, unspecified knee: Secondary | ICD-10-CM | POA: Diagnosis not present

## 2023-08-31 DIAGNOSIS — I10 Essential (primary) hypertension: Secondary | ICD-10-CM | POA: Diagnosis not present

## 2023-08-31 DIAGNOSIS — E790 Hyperuricemia without signs of inflammatory arthritis and tophaceous disease: Secondary | ICD-10-CM | POA: Diagnosis not present

## 2023-08-31 DIAGNOSIS — M1990 Unspecified osteoarthritis, unspecified site: Secondary | ICD-10-CM | POA: Diagnosis not present

## 2023-08-31 DIAGNOSIS — K6389 Other specified diseases of intestine: Secondary | ICD-10-CM | POA: Diagnosis not present

## 2023-09-01 DIAGNOSIS — M1990 Unspecified osteoarthritis, unspecified site: Secondary | ICD-10-CM | POA: Diagnosis not present

## 2023-09-01 DIAGNOSIS — M79671 Pain in right foot: Secondary | ICD-10-CM | POA: Diagnosis not present

## 2023-09-01 DIAGNOSIS — M79672 Pain in left foot: Secondary | ICD-10-CM | POA: Diagnosis not present

## 2023-09-01 DIAGNOSIS — M25571 Pain in right ankle and joints of right foot: Secondary | ICD-10-CM | POA: Diagnosis not present

## 2023-09-01 DIAGNOSIS — M25572 Pain in left ankle and joints of left foot: Secondary | ICD-10-CM | POA: Diagnosis not present

## 2023-09-03 DIAGNOSIS — H2513 Age-related nuclear cataract, bilateral: Secondary | ICD-10-CM | POA: Diagnosis not present

## 2023-09-07 ENCOUNTER — Telehealth: Payer: Self-pay | Admitting: *Deleted

## 2023-09-07 DIAGNOSIS — M170 Bilateral primary osteoarthritis of knee: Secondary | ICD-10-CM | POA: Diagnosis not present

## 2023-09-07 NOTE — Telephone Encounter (Signed)
Primary Cardiologist:James Hochrein, MD  Chart reviewed as part of pre-operative protocol coverage. Because of [name] [name]'s past medical history and time since last visit, he/she will require a follow-up visit in order to better assess preoperative cardiovascular risk.  Pre-op covering staff: - Please schedule appointment and call patient to inform them (Has not been seen since 2019, will have to see physician).  - Please contact requesting surgeon's office via preferred method (i.e, phone, fax) to inform them of need for appointment prior to surgery.  Levi Aland, NP-C  09/07/2023, 4:30 PM [address] Office [phone] Fax [phone]

## 2023-09-07 NOTE — Telephone Encounter (Signed)
Pre-operative Risk Assessment    Patient Name: [name] [name]  DOB: Jun 09, 1961 MRN: [id number]  DATE OF LAST VISIT: 09/13/18 DR. HOCHREIN; PT WILL NEED NEW PT APPT DATE OF NEXT VISIT: NONE    Request for Surgical Clearance    Procedure:   LEFT TOTAL KNEE ARTHROPLASTY  Date of Surgery:  Clearance TBD                                 Surgeon:  DR. Margarita Rana Surgeon's Group or Practice Name:  [facility] Phone number:  [phone] EXT [phone] ATTN: [facility] Fax number:  [phone]   Type of Clearance Requested:   - Medical ; NO MEDICATIONS LISTED AS NEEDING TO BE HELD   Type of Anesthesia:  Spinal   Additional requests/questions:    Elpidio Anis   09/07/2023, 4:09 PM

## 2023-09-10 NOTE — Telephone Encounter (Signed)
I will update all parties the pt has new pt appt with Dr. Tresa Endo for pre op clearance.

## 2023-09-14 ENCOUNTER — Ambulatory Visit (HOSPITAL_BASED_OUTPATIENT_CLINIC_OR_DEPARTMENT_OTHER): Payer: Medicare Other

## 2023-09-14 ENCOUNTER — Encounter: Payer: Self-pay | Admitting: Cardiovascular Disease

## 2023-09-14 ENCOUNTER — Ambulatory Visit: Payer: Medicare Other | Attending: Cardiovascular Disease | Admitting: Cardiovascular Disease

## 2023-09-14 VITALS — BP 106/72 | HR 96 | Ht 62.5 in | Wt 212.8 lb

## 2023-09-14 DIAGNOSIS — I952 Hypotension due to drugs: Secondary | ICD-10-CM | POA: Diagnosis not present

## 2023-09-14 DIAGNOSIS — G4733 Obstructive sleep apnea (adult) (pediatric): Secondary | ICD-10-CM | POA: Diagnosis not present

## 2023-09-14 DIAGNOSIS — I447 Left bundle-branch block, unspecified: Secondary | ICD-10-CM

## 2023-09-14 DIAGNOSIS — Z01810 Encounter for preprocedural cardiovascular examination: Secondary | ICD-10-CM | POA: Diagnosis not present

## 2023-09-14 DIAGNOSIS — E7800 Pure hypercholesterolemia, unspecified: Secondary | ICD-10-CM | POA: Diagnosis not present

## 2023-09-14 DIAGNOSIS — Z8679 Personal history of other diseases of the circulatory system: Secondary | ICD-10-CM

## 2023-09-14 NOTE — Progress Notes (Addendum)
Cardiology Office Note    Date:  09/19/2023   ID:  [id number] [name] [name], DOB 1960-12-16, MRN [id number]  PCP:  Shirlean Mylar, MD  Cardiologist:  Nicki Guadalajara, MD   New cardiology consultation referred for preoperative clearance prior to undergoing left knee replacement surgery with Dr. Renaye Rakers.   History of Present Illness:  [name] [name] is a 62 y.o. female who in the past had seen Dr. Rollene Rotunda for cardiology.  Apparently in 2018 an echo Doppler study showed well-preserved ejection fraction with mild white regurgitation.  In September 2018 she was admitted with acute shortness of breath and an echo Doppler study demonstrated an EF of 40%.  At the time she was being treated with golimumab.  A subsequent follow-up echo Doppler study showed normalization of LV function.  She last saw Dr. Antoine Poche on October 27, 2018 for left upper chest discomfort not felt to be ischemic in etiology and most likely musculoskeletal in.  She has a history of obstructive sleep apnea and had been on's CPAP therapy following a sleep study in 2017 interpreted by Dr. Fannie Knee which showed moderate sleep apnea with an AHI of 16.0/h.  She had severe oxygen desaturation to a nadir of 62%.  Apparently, she had been on CPAP therapy but essentially quit CPAP 4 years ago around 2020.  She has a history of hypertension and had been on recent reduced doses of medications due to low blood pressure.  She is followed by Dr. Chari Manning at [facility] after her primary physician had retired.  Most recently, she is taking carvedilol at 6.25 mg twice a day, furosemide 20 mg, olmesartan reduced at just 5 mg, and she is taking Mounjaro.  She is in need for left knee replacement to be done by Dr. Eulah Pont.  She denies any chest pain or shortness of breath.  She rarely experiences a brief dizzy spell.  She is unaware of palpitations.  She is now scheduled for preoperative cardiology clearance.   Past Medical  History:  Diagnosis Date   Asthma    CHF (congestive heart failure) (HCC)    Hypertension    Joint pain    Lumbar herniated disc    OA (osteoarthritis)    Obesity    Pre-diabetes    Rheumatoid arthritis (HCC)    Sleep apnea    CPAP   SOB (shortness of breath)     Past Surgical History:  Procedure Laterality Date   BREAST SURGERY     breast reduction    LAPAROSCOPIC GASTRIC SLEEVE RESECTION N/A 09/29/2016   Procedure: LAPAROSCOPIC GASTRIC SLEEVE RESECTION, UPPER ENDO;  Surgeon: Gaynelle Adu, MD;  Location: [facility];  Service: General;  Laterality: N/A;   REDUCTION MAMMAPLASTY Bilateral 2003   removed bone from left knee      UPPER GI ENDOSCOPY  09/29/2016   Procedure: UPPER GI ENDOSCOPY;  Surgeon: Gaynelle Adu, MD;  Location: [facility];  Service: General;;    Current Medications: Outpatient Medications Prior to Visit  Medication Sig Dispense Refill   acetaminophen (TYLENOL) 500 MG tablet Take 500-1,000 mg by mouth every 6 (six) hours as needed for mild pain or headache.     albuterol (PROVENTIL) (2.5 MG/3ML) 0.083% nebulizer solution Use 1 vial (3 mLs) by nebulization every 4 hours as needed for 10 days. 180 mL 2   albuterol (VENTOLIN HFA) 108 (90 Base) MCG/ACT inhaler Inhale 2 puffs into the lungs every 4 (four) hours  as needed. 6.7 g 0   carvedilol (COREG) 6.25 MG tablet Take 6.25 mg by mouth 2 (two) times daily with a meal.     furosemide (LASIX) 20 MG tablet Take 20 mg by mouth daily.     potassium chloride (KLOR-CON) 10 MEQ tablet Take 10 mEq by mouth daily. Take one tablet daily     tirzepatide (MOUNJARO) 10 MG/0.5ML Pen Inject 10mg  under the skin once a week 6 mL 0   valACYclovir (VALTREX) 500 MG tablet TAKE 1 TABLET BY MOUTH EVERY DAY 30 tablet 0   carvedilol (COREG) 12.5 MG tablet Take 1 tablet (12.5 mg total) by mouth 2 (two) times daily with a meal. 180 tablet 3   furosemide (LASIX) 40 MG tablet Take 1 tablet (40 mg total) by mouth daily. KEEP OV. 30 tablet 2   olmesartan  (BENICAR) 20 MG tablet Take 1 tablet (20 mg total) by mouth daily. 30 tablet 5   olmesartan (BENICAR) 5 MG tablet Take 5 mg by mouth daily.     allopurinol (ZYLOPRIM) 100 MG tablet Take 1 tablet (100 mg total) by mouth daily. 90 tablet 0   celecoxib (CELEBREX) 200 MG capsule Take 1 capsule (200 mg total) by mouth 2 (two) times daily. (Patient not taking: Reported on 08/17/2023) 30 capsule 0   meloxicam (MOBIC) 7.5 MG tablet Take 1 tablet (7.5 mg total) by mouth daily as needed. (Patient not taking: Reported on 08/17/2023) 30 tablet 1   montelukast (SINGULAIR) 10 MG tablet Take 10 mg by mouth daily. (Patient not taking: Reported on 08/17/2023)     potassium chloride SA (KLOR-CON M) 20 MEQ tablet Take 1 tablet (20 mEq total) by mouth daily for 3 days. (Patient taking differently: Take 10 mEq by mouth daily.) 3 tablet 0   predniSONE (DELTASONE) 20 MG tablet Take 3 tablets (60mg ) on days 1-2. Take 2 tablets (40mg ) on days 3-4. Take 1 tablet (20mg ) on days 5-6, then stop. 12 tablet 0   tirzepatide (MOUNJARO) 12.5 MG/0.5ML Pen Inject 12.5 mg into the skin once a week. 9 mL 0   tirzepatide (MOUNJARO) 2.5 MG/0.5ML Pen Inject 2.5 mg into the skin once a week. 2 mL 0   tirzepatide (MOUNJARO) 5 MG/0.5ML Pen Inject 5 mg into the skin once a week. 2 mL 0   tirzepatide (MOUNJARO) 7.5 MG/0.5ML Pen 7.5mg  Subcutaneous once a week 30 days 2 mL 0   tiZANidine (ZANAFLEX) 2 MG tablet Take 1 tablet (2 mg total) by mouth every 8 (eight) hours as needed for muscle spasms. (Patient not taking: Reported on 08/17/2023) 21 tablet 0   No facility-administered medications prior to visit.     Allergies:   Penicillins, Amoxicillin, Tetanus-diphth-acell pertussis, and Tdap [tetanus-diphth-acell pertussis]   Social History   Socioeconomic History   Marital status: Married    Spouse name: Not on file   Number of children: Not on file   Years of education: Not on file   Highest education level: Not on file  Occupational History    Occupation: Customer Service  Tobacco Use   Smoking status: Never   Smokeless tobacco: Never  Vaping Use   Vaping status: Never Used  Substance and Sexual Activity   Alcohol use: No    Alcohol/week: 0.0 standard drinks of alcohol   Drug use: No   Sexual activity: Yes    Birth control/protection: None  Other Topics Concern   Not on file  Social History Narrative   Not on file  Social Determinants of Health   Financial Resource Strain: Not on file  Food Insecurity: No Food Insecurity (10/06/2017)   Hunger Vital Sign    Worried About Running Out of Food in the Last Year: Never true    Ran Out of Food in the Last Year: Never true  Transportation Needs: Not on file  Physical Activity: Not on file  Stress: Not on file  Social Connections: Unknown (04/08/2022)   Received from [facility]   Social Network    Social Network: Not on file    Social history is notable that she is married for 20 years.  She has 1 child, 4 grandchildren.  She is retired.  She completed 16 years of education.  There is no tobacco use or alcohol use.  She does not routinely exercise.  Family History:  The patient's family history includes Diabetes in her brother; Hypertension in her brother; Obesity in her mother; Ovarian cancer (age of onset: 42) in her mother.  Both parents are deceased.  She has a brother age 63 who is healthy.  Her child is age 62 and healthy.  ROS General: Negative; No fevers, chills, or night sweats; obesity HEENT: Negative; No changes in vision or hearing, sinus congestion, difficulty swallowing Pulmonary: Negative; No cough, wheezing, shortness of breath, hemoptysis Cardiovascular: Negative; No chest pain, presyncope, syncope, palpitations GI: Negative; No nausea, vomiting, diarrhea, or abdominal pain GU: Negative; No dysuria, hematuria, or difficulty voiding Musculoskeletal: in need  for left knee replacement Hematologic/Oncology: Negative; no easy bruising,  bleeding Endocrine: Negative; no heat/cold intolerance; no diabetes Neuro: Negative; no changes in balance, headaches Skin: Negative; No rashes or skin lesions Psychiatric: Negative; No behavioral problems, depression Sleep: Negative; No snoring, daytime sleepiness, hypersomnolence, bruxism, restless legs, hypnogognic hallucinations, no cataplexy Other comprehensive 14 point system review is negative.   PHYSICAL EXAM:   VS:  BP 106/72 (BP Location: Left Arm, Patient Position: Sitting, Cuff Size: Normal)   Pulse 96   Ht 5' 2.5" (1.588 m)   Wt 212 lb 12.8 oz (96.5 kg)   SpO2 98%   BMI 38.30 kg/m     Repeat blood pressure by me was 94/68  Wt Readings from Last 3 Encounters:  09/14/23 212 lb 12.8 oz (96.5 kg)  11/20/22 262 lb 6.4 oz (119 kg)  09/16/22 269 lb 6.4 oz (122.2 kg)    General: Alert, oriented, no distress.  Moderately obese Skin: normal turgor, no rashes, warm and dry HEENT: Normocephalic, atraumatic. Pupils equal round and reactive to light; sclera anicteric; extraocular muscles intact;  Nose without nasal septal hypertrophy Mouth/Parynx benign; Mallinpatti scale 3 Neck: No JVD, no carotid bruits; normal carotid upstroke Lungs: clear to ausculatation and percussion; no wheezing or rales Chest wall: without tenderness to palpitation Heart: PMI not displaced, RRR, s1 s2 normal, 1/6 systolic murmur, no diastolic murmur, no rubs, gallops, thrills, or heaves Abdomen: soft, nontender; no hepatosplenomehaly, BS+; abdominal aorta nontender and not dilated by palpation. Back: no CVA tenderness Pulses 2+ Musculoskeletal: full range of motion, normal strength, no joint deformities Extremities: no clubbing cyanosis or edema, Homan's sign negative  Neurologic: grossly nonfocal; Cranial nerves grossly wnl Psychologic: Normal mood and affect   Studies/Labs Reviewed:   EKG Interpretation Date/Time:  Monday September 14 2023 10:42:30 EDT Ventricular Rate:  92 PR  Interval:  162 QRS Duration:  124 QT Interval:  388 QTC Calculation: 479 R Axis:   8  Text Interpretation: Normal sinus rhythm Left bundle branch block When  compared with ECG of 09-Aug-2017 03:01, PREVIOUS ECG IS PRESENT Confirmed by Nicki Guadalajara ([id number]) on 09/14/2023 11:21:39 AM    Recent Labs:    Latest Ref Rng & Units 08/17/2023    7:06 PM 04/04/2020   11:32 AM 03/03/2018    8:50 AM  BMP  Glucose 70 - 99 mg/dL 99  99  94   BUN 8 - 23 mg/dL 15  15  17    Creatinine 0.44 - 1.00 mg/dL 6.04  5.40  9.81   BUN/Creat Ratio 9 - 23   29   Sodium 135 - 145 mmol/L 140  141  142   Potassium 3.5 - 5.1 mmol/L 2.8  3.6  4.6   Chloride 98 - 111 mmol/L 99  103  102   CO2 22 - 32 mmol/L 27  29  29    Calcium 8.9 - 10.3 mg/dL 9.6  9.4  9.5         Latest Ref Rng & Units 09/30/2016    4:44 AM 09/25/2016    9:05 AM 06/06/2014    8:50 AM  Hepatic Function  Total Protein 6.5 - 8.1 g/dL 7.5  7.8  7.5   Albumin 3.5 - 5.0 g/dL 3.7  3.9  3.8   AST 15 - 41 U/L 31  24  19    ALT 14 - 54 U/L 28  23  24    Alk Phosphatase 38 - 126 U/L 74  81  106   Total Bilirubin 0.3 - 1.2 mg/dL 0.3  0.3  0.4        Latest Ref Rng & Units 09/16/2022   11:09 AM 04/04/2020   11:32 AM 08/09/2017    4:23 AM  CBC  WBC 3.4 - 10.8 x10E3/uL 6.1  5.7  7.1   Hemoglobin 11.1 - 15.9 g/dL 19.1  47.8  29.5   Hematocrit 34.0 - 46.6 % 37.1  37.1  38.2   Platelets 150 - 400 K/uL  241  246    Lab Results  Component Value Date   MCV 88 09/16/2022   MCV 90.5 04/04/2020   MCV 87.0 08/09/2017   Lab Results  Component Value Date   TSH 1.510 09/16/2022   Lab Results  Component Value Date   HGBA1C 6.2 (H) 09/25/2016     BNP    Component Value Date/Time   BNP 414.5 (H) 08/09/2017 0954    ProBNP No results found for: "PROBNP"   Lipid Panel  No results found for: "CHOL", "TRIG", "HDL", "CHOLHDL", "VLDL", "LDLCALC", "LDLDIRECT", "LABVLDL"   RADIOLOGY:   Additional studies/ records that were reviewed today include:   I reviewed the prior evaluation of Dr. Antoine Poche.  ER evaluation was reviewed.  Previous sleep study from October 2017 was reviewed.    ASSESSMENT:    1. Preop cardiovascular exam   2. History of hypertension   3. Hypotension due to drugs   4. LBBB (left bundle branch block)   5. Pure hypercholesterolemia: Possible familial hyperlipidemia   6. OSA (obstructive sleep apnea): Stopped CPAP in 2020     PLAN:  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  Subsequent echo Doppler study on February 08, 2018 showed normalization of LV function with EF at 55 to 60%.  Over the years, she has been treated with multiple drug regimen and had been on carvedilol 12.5 mg twice a day,  furosemide 40 mg daily, and olmesartan 20 mg.  Most recently, her medications have been reduced due to low blood pressure and presently she is taking carvedilol 6.25 mg twice a day, furosemide 20 mg, and olmesartan at just 5 mg daily.  Her blood pressure today when taken initially was 106/72 but when rechecked by me was 94/68.  She has not had any chest pain or shortness of breath.  She has rarely exhibited faint dizzy spell but denies any significant presyncope or syncope.  Presently I am recommending she discontinue her olmesartan.  I am scheduling her for 2D echo Doppler study which will be helpful for reassessment of LV systolic and diastolic function.  Her ECG demonstrates left bundle branch block which may be associated with wall motion dyssynchrony.  She now sees Dr. Chari Manning at [place] since Dr. Hyman Hopes is no longer in practice there.  She had recently undergone laboratory which I did not have when I saw her.  Lipid studies were significantly elevated  with total cholesterol 277 LDL 193 triglycerides 126 suggestive of possible familial hyperlipidemia.  She is not on lipid-lowering therapy.  I  have recommended she undergo a 2D echo Doppler study.  If her echo Doppler study shows continued normalization of LV function and valvular architecture she  will be given clearance for her upcoming elective surgery.  I am scheduling her 2-week follow-up evaluation to be seen with our APP for reassessment.  At that time, she should be started on aggressive lipid-lowering therapy if she has not yet been started on this by her primary physician.  I would recommend future coronary calcium score in light of her significant hyperlipidemia.  She has previously documented moderate sleep apnea but stopped CPAP therapy in 2021 currently believes she is sleeping well.   Medication Adjustments/Labs and Tests Ordered: Current medicines are reviewed at length with the patient today.  Concerns regarding medicines are outlined above.  Medication changes, Labs and Tests ordered today are listed in the Patient Instructions below. Patient Instructions  Medication Instructions:  STOP THE OLMESARTAN 5MG  *If you need a refill on your cardiac medications before your next appointment, please call your pharmacy*   Lab Work: NONE If you have labs (blood work) drawn today and your tests are completely normal, you will receive your results only by: MyChart Message (if you have MyChart) OR A paper copy in the mail If you have any lab test that is abnormal or we need to change your treatment, we will call you to review the results.   Testing/Procedures: Your physician has requested that you have an echocardiogram in one week. Echocardiography is a painless test that uses sound waves to create images of your heart. It provides your doctor with information about the size and shape of your heart and how well your heart's chambers and valves are working. This procedure takes approximately one hour. There are no restrictions for this procedure. Please do NOT wear cologne, perfume, aftershave, or lotions (deodorant is  allowed).  Please arrive 15 minutes prior to your appointment time.    Follow-Up: At [facility], you and your health needs are our priority.  As part of our continuing mission to provide you with exceptional heart care, we have created designated Provider Care Teams.  These Care Teams include your primary Cardiologist (physician) and Advanced Practice Providers (APPs -  Physician Assistants and Nurse Practitioners) who all work together to provide you with the care you need, when you need it.  We recommend signing up for the patient portal called "MyChart".  Sign up information is provided on this After Visit Summary.  MyChart is used to connect with patients for Virtual Visits (Telemedicine).  Patients are able to view lab/test results, encounter notes, upcoming appointments, etc.  Non-urgent messages can be sent to your provider as well.   To learn more about what you can do with MyChart, go to [email or web address].    Your next appointment:   2 week(s)   Provider:   Azalee Course, PA-C           Signed, Nicki Guadalajara, MD  09/19/2023 10:35 AM    [facility] [address], [place], [place]  [address] Phone: [phone]

## 2023-09-14 NOTE — Patient Instructions (Addendum)
Medication Instructions:  STOP THE OLMESARTAN 5MG  *If you need a refill on your cardiac medications before your next appointment, please call your pharmacy*   Lab Work: NONE If you have labs (blood work) drawn today and your tests are completely normal, you will receive your results only by: MyChart Message (if you have MyChart) OR A paper copy in the mail If you have any lab test that is abnormal or we need to change your treatment, we will call you to review the results.   Testing/Procedures: Your physician has requested that you have an echocardiogram in one week. Echocardiography is a painless test that uses sound waves to create images of your heart. It provides your doctor with information about the size and shape of your heart and how well your heart's chambers and valves are working. This procedure takes approximately one hour. There are no restrictions for this procedure. Please do NOT wear cologne, perfume, aftershave, or lotions (deodorant is allowed).  Please arrive 15 minutes prior to your appointment time.    Follow-Up: At [facility], you and your health needs are our priority.  As part of our continuing mission to provide you with exceptional heart care, we have created designated Provider Care Teams.  These Care Teams include your primary Cardiologist (physician) and Advanced Practice Providers (APPs -  Physician Assistants and Nurse Practitioners) who all work together to provide you with the care you need, when you need it.  We recommend signing up for the patient portal called "MyChart".  Sign up information is provided on this After Visit Summary.  MyChart is used to connect with patients for Virtual Visits (Telemedicine).  Patients are able to view lab/test results, encounter notes, upcoming appointments, etc.  Non-urgent messages can be sent to your provider as well.   To learn more about what you can do with MyChart, go to [email or web address].    Your  next appointment:   2 week(s)   Provider:   Azalee Course, PA-C

## 2023-09-16 LAB — ECHOCARDIOGRAM COMPLETE
Area-P 1/2: 3.74 cm2
Calc EF: 66 %
Height: 62.5 in
S' Lateral: 2.68 cm
Single Plane A2C EF: 68.5 %
Single Plane A4C EF: 64.3 %
Weight: 3404.8 [oz_av]

## 2023-09-17 ENCOUNTER — Other Ambulatory Visit (HOSPITAL_COMMUNITY): Payer: Self-pay

## 2023-09-19 ENCOUNTER — Encounter: Payer: Self-pay | Admitting: Cardiovascular Disease

## 2023-09-22 ENCOUNTER — Other Ambulatory Visit (HOSPITAL_COMMUNITY): Payer: Self-pay

## 2023-09-22 DIAGNOSIS — M109 Gout, unspecified: Secondary | ICD-10-CM | POA: Diagnosis not present

## 2023-09-22 DIAGNOSIS — K6389 Other specified diseases of intestine: Secondary | ICD-10-CM | POA: Diagnosis not present

## 2023-09-22 DIAGNOSIS — M1990 Unspecified osteoarthritis, unspecified site: Secondary | ICD-10-CM | POA: Diagnosis not present

## 2023-09-22 DIAGNOSIS — I10 Essential (primary) hypertension: Secondary | ICD-10-CM | POA: Diagnosis not present

## 2023-09-22 DIAGNOSIS — R768 Other specified abnormal immunological findings in serum: Secondary | ICD-10-CM | POA: Diagnosis not present

## 2023-09-22 DIAGNOSIS — E790 Hyperuricemia without signs of inflammatory arthritis and tophaceous disease: Secondary | ICD-10-CM | POA: Diagnosis not present

## 2023-09-22 MED ORDER — ALLOPURINOL 100 MG PO TABS
100.0000 mg | ORAL_TABLET | Freq: Every day | ORAL | 1 refills | Status: DC
  Filled 2023-09-22: qty 30, 30d supply, fill #0

## 2023-09-22 MED ORDER — PREDNISONE 10 MG PO TABS
10.0000 mg | ORAL_TABLET | Freq: Every day | ORAL | 1 refills | Status: DC
  Filled 2023-09-22: qty 30, 30d supply, fill #0
  Filled 2023-11-19: qty 30, 30d supply, fill #1

## 2023-09-23 ENCOUNTER — Other Ambulatory Visit (HOSPITAL_COMMUNITY): Payer: Self-pay

## 2023-09-29 ENCOUNTER — Ambulatory Visit: Payer: Medicare Other | Attending: Physician Assistant | Admitting: Physician Assistant

## 2023-09-29 ENCOUNTER — Other Ambulatory Visit (HOSPITAL_COMMUNITY): Payer: Self-pay

## 2023-09-29 VITALS — BP 116/72 | HR 82 | Ht 62.5 in | Wt 212.0 lb

## 2023-09-29 DIAGNOSIS — Z79899 Other long term (current) drug therapy: Secondary | ICD-10-CM

## 2023-09-29 DIAGNOSIS — Z8679 Personal history of other diseases of the circulatory system: Secondary | ICD-10-CM | POA: Diagnosis not present

## 2023-09-29 DIAGNOSIS — Z01818 Encounter for other preprocedural examination: Secondary | ICD-10-CM

## 2023-09-29 DIAGNOSIS — E7800 Pure hypercholesterolemia, unspecified: Secondary | ICD-10-CM

## 2023-09-29 DIAGNOSIS — I420 Dilated cardiomyopathy: Secondary | ICD-10-CM

## 2023-09-29 MED ORDER — ATORVASTATIN CALCIUM 40 MG PO TABS
40.0000 mg | ORAL_TABLET | Freq: Every day | ORAL | 3 refills | Status: AC
  Filled 2023-09-29: qty 90, 90d supply, fill #0

## 2023-09-29 MED ORDER — METOPROLOL TARTRATE 100 MG PO TABS
100.0000 mg | ORAL_TABLET | Freq: Once | ORAL | 0 refills | Status: DC
  Filled 2023-09-29: qty 1, 1d supply, fill #0

## 2023-09-29 NOTE — Patient Instructions (Addendum)
Medication Instructions:  START ATORVASTATIN 40 MG DAILY.  *If you need a refill on your cardiac medications before your next appointment, please call your pharmacy*   Lab Work: BMET TODAY.  FASTING LIPIDS AND LIVER FUNCTION IN 6-8 WEEKS.  If you have labs (blood work) drawn today and your tests are completely normal, you will receive your results only by: MyChart Message (if you have MyChart) OR A paper copy in the mail If you have any lab test that is abnormal or we need to change your treatment, we will call you to review the results.   Testing/Procedures: Your physician has requested that you have cardiac CT. Cardiac computed tomography (CT) is a painless test that uses an x-ray machine to take clear, detailed pictures of your heart. For further information please visit [email or web address] Please follow instruction sheet as given.     Follow-Up: At [facility], you and your health needs are our priority.  As part of our continuing mission to provide you with exceptional heart care, we have created designated Provider Care Teams.  These Care Teams include your primary Cardiologist (physician) and Advanced Practice Providers (APPs -  Physician Assistants and Nurse Practitioners) who all work together to provide you with the care you need, when you need it.    Your next appointment:   3-4 month(s) AFTER PROCEDURE  Provider:   Rollene Rotunda, MD   Other Instructions   Your cardiac CT will be scheduled at one of the below locations:   [facility] [address] [place], [place] [address] [phone]  If scheduled at [facility], please arrive at the [facility] and Children's Entrance (Entrance C2) of [facility] 30 minutes prior to test start time. You can use the FREE valet parking offered at entrance C (encouraged to control the heart rate for the test)  Proceed to the [facility] Department (first floor) to check-in and test  prep.  All radiology patients and guests should use entrance C2 at [facility], accessed from [facility], even though the hospital's physical address listed is [address].     Please follow these instructions carefully (unless otherwise directed):  An IV will be required for this test and Nitroglycerin will be given.   On the Night Before the Test: Be sure to Drink plenty of water. Do not consume any caffeinated/decaffeinated beverages or chocolate 12 hours prior to your test. Do not take any antihistamines 12 hours prior to your test.  On the Day of the Test: Drink plenty of water until 1 hour prior to the test. Do not eat any food 1 hour prior to test. You may take your regular medications prior to the test.  Take one time dose of metoprolol (Lopressor) 100 mg two hours prior to test. If you take Furosemide/Hydrochlorothiazide/Spironolactone,please HOLD on the morning of the test. Morning of test hold Carvedilol. FEMALES- please wear underwire-free bra if available, avoid dresses & tight clothing      After the Test: Drink plenty of water. After receiving IV contrast, you may experience a mild flushed feeling. This is normal. On occasion, you may experience a mild rash up to 24 hours after the test. This is not dangerous. If this occurs, you can take Benadryl 25 mg and increase your fluid intake. If you experience trouble breathing, this can be serious. If it is severe call 911 IMMEDIATELY. If it is mild, please call our office. If you take any  of these medications: Glipizide/Metformin, Avandament, Glucavance, please do not take 48 hours after completing test unless otherwise instructed.  We will call to schedule your test 2-4 weeks out understanding that some insurance companies will need an authorization prior to the service being performed.   For more information and frequently asked questions, please visit our website :  [email or web address]  For non-scheduling related questions, please contact the cardiac imaging nurse navigator should you have any questions/concerns: Cardiac Imaging Nurse Navigators Direct Office Dial: [phone]   For scheduling needs, including cancellations and rescheduling, please call [place], [phone].

## 2023-09-29 NOTE — Progress Notes (Unsigned)
Cardiology Office Note:  .   Date:  09/29/2023  ID:  [name] [name], DOB [birth year], MRN [id number] PCP: Shirlean Mylar, MD  [facility] Providers Cardiologist:  Rollene Rotunda, MD { Click to update primary MD,subspecialty MD or APP then REFRESH:1}   History of Present Illness: .   [name] [name] is a 62 y.o. female with PMH of LBBB, cardiomyopathy, OSA, hypertension, and prediabetes.  Echocardiogram in 2013 showed normal EF was mild MR.  Sleep study in 2017 showed moderate obstructive sleep apnea with severe oxygen desaturation to a nadir of 62%.  She was previously on CPAP therapy, and quit in 2020.  She was admitted in September 2018 with acute dyspnea.  Echocardiogram at the time demonstrated EF of 40%.  She was being treated with Simponi Aria at the time.  Left and right heart cath was recommended, however due to cost, she chose not to have it.  Follow-up echocardiogram did show that EF improved to 55 to 60%.  She was last seen by Dr. Antoine Poche in December 2019 for atypical chest pain that was felt to be more musculoskeletal in nature.  No further workup was recommended.  More recently, patient was established with Dr. Tresa Endo on 09/14/2023 for preoperative cardiac clearance prior to left knee replacement surgery by Dr. Margarita Rana.  Based on the office note, her medication has been cut back due to low blood pressure.  Because of low blood pressure during the office visit, olmesartan was discontinued.  Dr. Tresa Endo recommended echocardiogram, if normal, she can be cleared for the upcoming elective surgery.  Dr. Tresa Endo also recommended aggressive lipid-lowering therapy as well.  Echocardiogram completed on 09/14/2023 shows her EF 45 to 50%, hypokinesis of the LV basal mid septal wall, dyskinesis of the LV entire inferior wall, trivial MR, dilated aorta measuring at 38 mm.  Patient presents today for follow-up.  She denies any significant shortness of breath or recent  chest discomfort.  We discussed the echocardiogram finding and the dilated cardiomyopathy.  We recommend a coronary CT to further assess, if she does not have severe coronary artery disease, she will be cleared for the upcoming surgery.  She does have significant knee pain.  We also reviewed the recent cholesterol blood work, her cholesterol is very much uncontrolled.  LDL was 193 and a total cholesterol was 277 on 08/24/2023.  I recommend the addition of atorvastatin 40 mg daily.  She will need fasting lipid panel and LFT in 6 to 8 weeks.  We will give her a single dose of metoprolol tartrate 100 mg in the morning of the coronary CT taken 2 hours prior to the study.  She has been instructed to hold carvedilol in the same morning to avoid significant drop in the blood pressure.  As last coronary CT is reassuring, she can follow-up with Dr. Antoine Poche in 3 to 69-month, earlier if the coronary CT came back abnormal.  ROS: ***  Studies Reviewed: .        *** Risk Assessment/Calculations:   {Does this patient have ATRIAL FIBRILLATION?:[phone]}         Physical Exam:   VS:  BP 116/72 (BP Location: Left Arm, Patient Position: Sitting, Cuff Size: Large)   Pulse 82   Ht 5' 2.5" (1.588 m)   Wt 212 lb (96.2 kg)   SpO2 99%   BMI 38.16 kg/m    Wt Readings from Last 3 Encounters:  09/29/23 212 lb (96.2 kg)  09/14/23 212 lb  12.8 oz (96.5 kg)  11/20/22 262 lb 6.4 oz (119 kg)    GEN: Well nourished, well developed in no acute distress NECK: No JVD; No carotid bruits CARDIAC: ***RRR, no murmurs, rubs, gallops RESPIRATORY:  Clear to auscultation without rales, wheezing or rhonchi  ABDOMEN: Soft, non-tender, non-distended EXTREMITIES:  No edema; No deformity   ASSESSMENT AND PLAN: .   ***    {Are you ordering a CV Procedure (e.g. stress test, cath, DCCV, TEE, etc)?   Press F2        :[id number]}  Dispo: ***  Signed, Azalee Course, PA

## 2023-09-30 LAB — BASIC METABOLIC PANEL
BUN/Creatinine Ratio: 12 (ref 12–28)
BUN: 10 mg/dL (ref 8–27)
CO2: 26 mmol/L (ref 20–29)
Calcium: 9.8 mg/dL (ref 8.7–10.3)
Chloride: 101 mmol/L (ref 96–106)
Creatinine, Ser: 0.86 mg/dL (ref 0.57–1.00)
Glucose: 91 mg/dL (ref 70–99)
Potassium: 4.3 mmol/L (ref 3.5–5.2)
Sodium: 141 mmol/L (ref 134–144)
eGFR: 75 mL/min/{1.73_m2} (ref >59)

## 2023-10-12 ENCOUNTER — Encounter (HOSPITAL_COMMUNITY): Payer: Self-pay

## 2023-10-12 DIAGNOSIS — I10 Essential (primary) hypertension: Secondary | ICD-10-CM | POA: Diagnosis not present

## 2023-10-12 DIAGNOSIS — E790 Hyperuricemia without signs of inflammatory arthritis and tophaceous disease: Secondary | ICD-10-CM | POA: Diagnosis not present

## 2023-10-12 DIAGNOSIS — R768 Other specified abnormal immunological findings in serum: Secondary | ICD-10-CM | POA: Diagnosis not present

## 2023-10-12 DIAGNOSIS — M109 Gout, unspecified: Secondary | ICD-10-CM | POA: Diagnosis not present

## 2023-10-12 DIAGNOSIS — K6389 Other specified diseases of intestine: Secondary | ICD-10-CM | POA: Diagnosis not present

## 2023-10-12 DIAGNOSIS — M1990 Unspecified osteoarthritis, unspecified site: Secondary | ICD-10-CM | POA: Diagnosis not present

## 2023-10-14 ENCOUNTER — Other Ambulatory Visit (HOSPITAL_COMMUNITY): Payer: Self-pay

## 2023-10-14 ENCOUNTER — Ambulatory Visit (HOSPITAL_COMMUNITY)
Admission: RE | Admit: 2023-10-14 | Discharge: 2023-10-14 | Disposition: A | Discharge status: Home / Self Care | Payer: Medicare Other | Source: Ambulatory Visit | Attending: Physician Assistant | Admitting: Physician Assistant

## 2023-10-14 DIAGNOSIS — I420 Dilated cardiomyopathy: Secondary | ICD-10-CM | POA: Insufficient documentation

## 2023-10-14 MED ORDER — METOPROLOL TARTRATE 5 MG/5ML IV SOLN
5.0000 mg | Freq: Once | INTRAVENOUS | Status: AC
  Administered 2023-10-14: 5 mg via INTRAVENOUS

## 2023-10-14 MED ORDER — ALLOPURINOL 100 MG PO TABS
50.0000 mg | ORAL_TABLET | Freq: Every day | ORAL | 3 refills | Status: AC
  Filled 2023-10-14 – 2023-11-19 (×2): qty 15, 30d supply, fill #0

## 2023-10-14 MED ORDER — METOPROLOL TARTRATE 5 MG/5ML IV SOLN
INTRAVENOUS | Status: AC
  Filled 2023-10-14: qty 5

## 2023-10-14 MED ORDER — PREDNISONE 10 MG PO TABS
10.0000 mg | ORAL_TABLET | Freq: Every day | ORAL | 3 refills | Status: DC
  Filled 2023-10-14 (×2): qty 30, 30d supply, fill #0

## 2023-10-14 MED ORDER — NITROGLYCERIN 0.4 MG SL SUBL: 0.8000 mg | SUBLINGUAL_TABLET | Freq: Once | SUBLINGUAL | Status: DC

## 2023-10-14 MED ORDER — DILTIAZEM HCL 25 MG/5ML IV SOLN
5.0000 mg | Freq: Once | INTRAVENOUS | Status: AC
  Administered 2023-10-14: 5 mg via INTRAVENOUS

## 2023-10-14 MED ORDER — NITROGLYCERIN 0.4 MG SL SUBL
SUBLINGUAL_TABLET | SUBLINGUAL | Status: AC
Start: 2023-10-14 — End: ?
  Filled 2023-10-14: qty 2

## 2023-10-14 MED ORDER — DILTIAZEM HCL 25 MG/5ML IV SOLN
INTRAVENOUS | Status: AC
  Filled 2023-10-14: qty 5

## 2023-10-15 ENCOUNTER — Other Ambulatory Visit (HOSPITAL_COMMUNITY): Payer: Self-pay

## 2023-10-19 ENCOUNTER — Telehealth: Payer: Self-pay | Admitting: Cardiology

## 2023-10-19 ENCOUNTER — Other Ambulatory Visit (HOSPITAL_COMMUNITY): Payer: Self-pay

## 2023-10-19 MED ORDER — METOPROLOL TARTRATE 100 MG PO TABS
100.0000 mg | ORAL_TABLET | Freq: Once | ORAL | 0 refills | Status: DC
  Filled 2023-10-19: qty 1, 1d supply, fill #0

## 2023-10-19 NOTE — Telephone Encounter (Signed)
Called and spoke to patient. She is requesting a refill for her Metoprolol. Patient was scheduled for a Coronary CT and took medication prior to procedure. Patient states she arrived for the procedure and they could not get her heart rate low enough to do procedure. Patient is rescheduled for 12/10. Medication sent to pharmacy as requested.

## 2023-10-19 NOTE — Telephone Encounter (Signed)
Pt c/o medication issue:  1. Name of Medication: metoprolol tartrate (LOPRESSOR) 100 MG tablet (Expired)   2. How are you currently taking this medication (dosage and times per day)?   3. Are you having a reaction (difficulty breathing--STAT)?   4. What is your medication issue? Patient states she needs this called in again, due to having to reschedule from last test.

## 2023-10-30 ENCOUNTER — Other Ambulatory Visit: Payer: Self-pay

## 2023-10-30 ENCOUNTER — Encounter (HOSPITAL_COMMUNITY): Payer: Self-pay

## 2023-10-30 ENCOUNTER — Emergency Department (HOSPITAL_COMMUNITY)
Admission: EM | Admit: 2023-10-30 | Discharge: 2023-10-30 | Disposition: A | Discharge status: Home / Self Care | Payer: Medicare Other | Attending: Emergency Medicine | Admitting: Emergency Medicine

## 2023-10-30 ENCOUNTER — Ambulatory Visit (HOSPITAL_COMMUNITY)
Admission: EM | Admit: 2023-10-30 | Discharge: 2023-10-30 | Disposition: A | Discharge status: Home / Self Care | Payer: Medicare Other | Source: Ambulatory Visit | Attending: Family Medicine | Admitting: Family Medicine

## 2023-10-30 VITALS — BP 126/78 | HR 101 | Temp 98.4°F | Resp 18

## 2023-10-30 DIAGNOSIS — R109 Unspecified abdominal pain: Secondary | ICD-10-CM | POA: Diagnosis not present

## 2023-10-30 DIAGNOSIS — K5903 Drug induced constipation: Secondary | ICD-10-CM

## 2023-10-30 DIAGNOSIS — K5909 Other constipation: Secondary | ICD-10-CM | POA: Diagnosis not present

## 2023-10-30 DIAGNOSIS — R1084 Generalized abdominal pain: Secondary | ICD-10-CM | POA: Diagnosis present

## 2023-10-30 MED ORDER — LIDOCAINE-EPINEPHRINE-TETRACAINE (LET) TOPICAL GEL
3.0000 mL | Freq: Once | TOPICAL | Status: AC
  Administered 2023-10-30: 3 mL via TOPICAL
  Filled 2023-10-30: qty 3

## 2023-10-30 MED ORDER — MINERAL OIL RE ENEM
1.0000 | ENEMA | Freq: Once | RECTAL | Status: AC
  Administered 2023-10-30: 1 via RECTAL
  Filled 2023-10-30: qty 1

## 2023-10-30 NOTE — ED Provider Notes (Signed)
[place] EMERGENCY DEPARTMENT AT [facility] Provider Note   CSN: [id number] Arrival date & time: 10/30/23  1111     History  Chief Complaint  Patient presents with   Constipation    [name] [name] is a 62 y.o. female who presents with 2-weeks of constipation. Endorses generalized abdominal pain and discomfort.  She has been using docusate and MiraLAX twice a day for the past week.  Yesterday she additionally added on milk of mag without any significant improvement.  Notes she has had bright red blood occasionally when wiping.  Last colonoscopy was 5 years ago, without any abnormalities.  No history of cancer.   Constipation      Home Medications Prior to Admission medications   Medication Sig Start Date End Date Taking? Authorizing Provider  acetaminophen (TYLENOL) 500 MG tablet Take 500-1,000 mg by mouth every 6 (six) hours as needed for mild pain or headache.    [provider]  albuterol (PROVENTIL) (2.5 MG/3ML) 0.083% nebulizer solution Use 1 vial (3 mLs) by nebulization every 4 hours as needed for 10 days. 03/10/22     albuterol (VENTOLIN HFA) 108 (90 Base) MCG/ACT inhaler Inhale 2 puffs into the lungs every 4 (four) hours as needed. 03/10/22     allopurinol (ZYLOPRIM) 100 MG tablet Take 1 tablet (100 mg total) by mouth daily. 09/22/23     allopurinol (ZYLOPRIM) 100 MG tablet Take 0.5 tablets (50 mg total) by mouth daily. 10/14/23     atorvastatin (LIPITOR) 40 MG tablet Take 1 tablet (40 mg total) by mouth daily. 09/29/23   Azalee Course, PA  carvedilol (COREG) 6.25 MG tablet Take 6.25 mg by mouth 2 (two) times daily with a meal.    [provider]  furosemide (LASIX) 20 MG tablet Take 20 mg by mouth daily.    [provider]  metoprolol tartrate (LOPRESSOR) 100 MG tablet Take 1 tablet (100 mg total) by mouth once for 1 dose. TAKE 2 HOURS PRIOR TO PROCEDURE 10/19/23 10/20/23  Azalee Course, PA  olmesartan (BENICAR) 20 MG tablet      [provider]  potassium chloride (KLOR-CON M) 10 MEQ tablet Take 10 mEq by mouth daily. 08/18/23   [provider]  potassium chloride (KLOR-CON) 10 MEQ tablet Take 10 mEq by mouth daily. Take one tablet daily    [provider]  predniSONE (DELTASONE) 10 MG tablet Take 1 tablet (10 mg total) by mouth daily. 09/22/23     predniSONE (DELTASONE) 10 MG tablet Take 1 tablet (10 mg total) by mouth daily. 10/14/23     tirzepatide [facility]) 10 MG/0.5ML Pen Inject 10mg  under the skin once a week 08/24/23     valACYclovir (VALTREX) 500 MG tablet TAKE 1 TABLET BY MOUTH EVERY DAY 06/14/15   Le, Thao P, DO      Allergies    Penicillins, Amoxicillin, Tetanus-diphth-acell pertussis, Celecoxib, and Tdap [tetanus-diphth-acell pertussis]    Review of Systems   Review of Systems  Gastrointestinal:  Positive for constipation.    Physical Exam Updated Vital Signs BP 134/88   Pulse 75   Temp 98.2 F (36.8 C) (Oral)   Resp 18   Ht 5\' 3"  (1.6 m)   Wt 93.4 kg   SpO2 99%   BMI 36.49 kg/m  Physical Exam Vitals and nursing note reviewed. Exam conducted with a chaperone present.  Constitutional:      General: She is not in acute distress.    Appearance: She is  well-developed.  HENT:     Head: Normocephalic and atraumatic.  Eyes:     Conjunctiva/sclera: Conjunctivae normal.  Cardiovascular:     Rate and Rhythm: Normal rate and regular rhythm.     Heart sounds: No murmur heard. Pulmonary:     Effort: Pulmonary effort is normal. No respiratory distress.     Breath sounds: Normal breath sounds.  Abdominal:     General: There is distension.     Palpations: Abdomen is soft.     Comments: Generalized mild tenderness, nonfocal  Genitourinary:    Comments: Multiple relatively large anal fissures, the largest measuring approximately 1 cm in length Musculoskeletal:        General: No swelling.     Cervical back: Neck supple.  Skin:    General: Skin is warm and dry.      Capillary Refill: Capillary refill takes less than 2 seconds.  Neurological:     Mental Status: She is alert.  Psychiatric:        Mood and Affect: Mood normal.     ED Results / Procedures / Treatments   Labs (all labs ordered are listed, but only abnormal results are displayed) Labs Reviewed - No data to display  EKG None  Radiology No results found.  Procedures Procedures    Medications Ordered in ED Medications  lidocaine-EPINEPHrine-tetracaine (LET) topical gel (3 mLs Topical Given 10/30/23 1225)  mineral oil enema 1 enema (1 enema Rectal Given 10/30/23 1340)    ED Course/ Medical Decision Making/ A&P                                 Medical Decision Making Risk OTC drugs.   This patient presents to the ED with chief complaint(s) of constipation.  The complaint involves an extensive differential diagnosis and also carries with it a high risk of complications and morbidity.   pertinent past medical history as listed in HPI  The differential diagnosis includes  SBO, functional constipation, malignancy The initial plan is to  Will perform DRE and give enema Additional history obtained: No additional historians Records reviewed Care Everywhere/External Records  Initial Assessment:   Patient is hemodynamically stable, afebrile.  Nonfocal abdominal exam.  Constipation x 2 weeks with failed outpatient treatment for the past week.  DRE does demonstrate notable anal fissures, there is some stool palpable during DRE, however not notably hard or suggestive of impaction.  Independent ECG interpretation:  None  Independent labs interpretation:  The following labs were independently interpreted:  None  Independent visualization and interpretation of imaging: None  Treatment and Reassessment: Enema given following first assessment and DRE  2:50 PM patient has had multiple successful bowel movements.  Abdominal exam nontender.  She feels that she is ready for  discharge.  Consultations obtained:   None  Disposition:   Patient will be discharged home with continued regiment of MiraLAX twice a day, also educated patient to increase fluid and fiber intake.  Recommended daily Metamucil.  Encourage close PCP follow-up in the next 3 to 5 days.  The patient has been appropriately medically screened and/or stabilized in the ED. I have low suspicion for any other emergent medical condition which would require further screening, evaluation or treatment in the ED or require inpatient management. At time of discharge the patient is hemodynamically stable and in no acute distress. I have discussed work-up results and diagnosis with patient and answered all questions.  Patient is agreeable with discharge plan. We discussed strict return precautions for returning to the emergency department and they verbalized understanding.     Social Determinants of Health:   None  This note was dictated with voice recognition software.  Despite best efforts at proofreading, errors may have occurred which can change the documentation meaning.          Final Clinical Impression(s) / ED Diagnoses Final diagnoses:  Other constipation    Rx / DC Orders ED Discharge Orders     None         Halford Decamp, PA-C 10/30/23 1507    Derwood Kaplan, MD 11/01/23 [phone]

## 2023-10-30 NOTE — ED Provider Notes (Signed)
[facility]    CSN: [id number] Arrival date & time: 10/30/23  1019      History   Chief Complaint Chief Complaint  Patient presents with   Constipation    Have not had BM in 2 weeks - Entered by patient    HPI [name] [name] is a 62 y.o. female.    Constipation Here for constipation and abdominal pain.  Last bowel movement was 2 weeks ago.  She is now having abdominal pain.  She has not had any results from taking MiraLAX for 3 days.  She is also taken milk of magnesia and stool softeners.  No fever  She began [facility] in April of this year and has been constipated since, but it got worse 2 weeks ago.  Past Medical History:  Diagnosis Date   Asthma    CHF (congestive heart failure) (HCC)    Hypertension    Joint pain    Lumbar herniated disc    OA (osteoarthritis)    Obesity    Pre-diabetes    Rheumatoid arthritis (HCC)    Sleep apnea    CPAP   SOB (shortness of breath)     Patient Active Problem List   Diagnosis Date Noted   Primary osteoarthritis of knees, bilateral 11/25/2022   Class 3 severe obesity with serious comorbidity and body mass index (BMI) of 45.0 to 49.9 in adult (HCC) 11/25/2022   Other fatigue 09/02/2022   SOB (shortness of breath) 09/02/2022   Hypertension, essential 09/02/2022   Congestive heart failure (HCC) 09/02/2022   Vitamin D deficiency 09/02/2022   Depression screening 09/02/2022   Class 3 severe obesity without serious comorbidity with body mass index (BMI) of 45.0 to 49.9 in adult (HCC) 09/02/2022   Pilonidal cyst 12/07/2019   Chest pain 10/27/2018   Medication management 02/17/2018   Cardiomyopathy (HCC) 10/02/2017   Morbid obesity with BMI of 45.0-49.9, adult (HCC) 10/02/2017   Pulmonary hypertension, unspecified (HCC) 10/02/2017   Rheumatoid arthritis (HCC) 10/02/2017   Acute CHF (congestive heart failure) (HCC) 08/09/2017   Prediabetes 09/29/2016   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    Obstructive sleep apnea 09/18/2016   Arthritis associated with another disorder 05/10/2016   Itching-Bilateral dorsum foot 07/11/2015   Pain in joint, ankle and foot 07/11/2015   Foot swelling 07/11/2015   FUO (fever of unknown origin) 03/14/2012   Hypokalemia 03/14/2012   Hyponatremia 03/14/2012   Dehydration 03/14/2012   LBBB (left bundle branch block) 03/14/2012   Obesity, morbid (HCC) 03/07/2008   Essential hypertension 03/07/2008   Regional enteritis (HCC) 03/07/2008   ABSCESS, PERIRECTAL 03/07/2008    Past Surgical History:  Procedure Laterality Date   BREAST SURGERY     breast reduction    LAPAROSCOPIC GASTRIC SLEEVE RESECTION N/A 09/29/2016   Procedure: LAPAROSCOPIC GASTRIC SLEEVE RESECTION, UPPER ENDO;  Surgeon: Gaynelle Adu, MD;  Location: [facility];  Service: General;  Laterality: N/A;   REDUCTION MAMMAPLASTY Bilateral 2003   removed bone from left knee      UPPER GI ENDOSCOPY  09/29/2016   Procedure: UPPER GI ENDOSCOPY;  Surgeon: Gaynelle Adu, MD;  Location: [facility];  Service: General;;    OB History   No obstetric history on file.      Home Medications    Prior to Admission medications   Medication Sig Start Date End Date Taking? Authorizing Provider  allopurinol (ZYLOPRIM) 100 MG tablet Take 1 tablet (100 mg total) by mouth daily. 09/22/23  Yes  allopurinol (ZYLOPRIM) 100 MG tablet Take 0.5 tablets (50 mg total) by mouth daily. 10/14/23  Yes   atorvastatin (LIPITOR) 40 MG tablet Take 1 tablet (40 mg total) by mouth daily. 09/29/23  Yes Azalee Course, PA  carvedilol (COREG) 6.25 MG tablet Take 6.25 mg by mouth 2 (two) times daily with a meal.   Yes [provider]  furosemide (LASIX) 20 MG tablet Take 20 mg by mouth daily.   Yes [provider]  olmesartan (BENICAR) 20 MG tablet    Yes [provider]  potassium chloride (KLOR-CON M) 10 MEQ tablet Take 10 mEq by mouth daily. 08/18/23  Yes [provider]  predniSONE (DELTASONE) 10 MG  tablet Take 1 tablet (10 mg total) by mouth daily. 09/22/23  Yes   predniSONE (DELTASONE) 10 MG tablet Take 1 tablet (10 mg total) by mouth daily. 10/14/23  Yes   tirzepatide (MOUNJARO) 10 MG/0.5ML Pen Inject 10mg  under the skin once a week 08/24/23  Yes   acetaminophen (TYLENOL) 500 MG tablet Take 500-1,000 mg by mouth every 6 (six) hours as needed for mild pain or headache.    [provider]  albuterol (PROVENTIL) (2.5 MG/3ML) 0.083% nebulizer solution Use 1 vial (3 mLs) by nebulization every 4 hours as needed for 10 days. 03/10/22     albuterol (VENTOLIN HFA) 108 (90 Base) MCG/ACT inhaler Inhale 2 puffs into the lungs every 4 (four) hours as needed. 03/10/22     metoprolol tartrate (LOPRESSOR) 100 MG tablet Take 1 tablet (100 mg total) by mouth once for 1 dose. TAKE 2 HOURS PRIOR TO PROCEDURE 10/19/23 10/20/23  Azalee Course, PA  potassium chloride (KLOR-CON) 10 MEQ tablet Take 10 mEq by mouth daily. Take one tablet daily    [provider]  valACYclovir (VALTREX) 500 MG tablet TAKE 1 TABLET BY MOUTH EVERY DAY 06/14/15   Le, Rachelle Hora, DO    Family History Family History  Problem Relation Age of Onset   Ovarian cancer Mother 65       Not sure of this diagnosis   Obesity Mother    Diabetes Brother    Hypertension Brother    Breast cancer Neg Hx     Social History Social History   Tobacco Use   Smoking status: Never   Smokeless tobacco: Never  Vaping Use   Vaping status: Never Used  Substance Use Topics   Alcohol use: No    Alcohol/week: 0.0 standard drinks of alcohol   Drug use: No     Allergies   Penicillins, Amoxicillin, Tetanus-diphth-acell pertussis, Celecoxib, and Tdap [tetanus-diphth-acell pertussis]   Review of Systems Review of Systems  Gastrointestinal:  Positive for constipation.     Physical Exam Triage Vital Signs ED Triage Vitals  Encounter Vitals Group     BP 10/30/23 1038 126/78     Systolic BP Percentile --      Diastolic BP Percentile --       Pulse Rate 10/30/23 1038 (!) 101     Resp 10/30/23 1038 18     Temp 10/30/23 1038 98.4 F (36.9 C)     Temp Source 10/30/23 1038 Oral     SpO2 10/30/23 1038 99 %     Weight --      Height --      Head Circumference --      Peak Flow --      Pain Score 10/30/23 1035 10     Pain Loc --  Pain Education --      Exclude from Growth Chart --    No data found.  Updated Vital Signs BP 126/78 (BP Location: Left Arm)   Pulse (!) 101   Temp 98.4 F (36.9 C) (Oral)   Resp 18   SpO2 99%   Visual Acuity Right Eye Distance:   Left Eye Distance:   Bilateral Distance:    Right Eye Near:   Left Eye Near:    Bilateral Near:     Physical Exam Vitals reviewed.  Constitutional:      Appearance: She is not ill-appearing, toxic-appearing or diaphoretic.     Comments: In some obvious discomfort, leaning over   Abdominal:     Palpations: Abdomen is soft.     Tenderness: There is no abdominal tenderness.  Skin:    Coloration: Skin is not pale.  Neurological:     Mental Status: She is alert and oriented to person, place, and time.  Psychiatric:        Behavior: Behavior normal.      UC Treatments / Results  Labs (all labs ordered are listed, but only abnormal results are displayed) Labs Reviewed - No data to display  EKG   Radiology No results found.  Procedures Procedures (including critical care time)  Medications Ordered in UC Medications - No data to display  Initial Impression / Assessment and Plan / UC Course  I have reviewed the triage vital signs and the nursing notes.  Pertinent labs & imaging results that were available during my care of the patient were reviewed by me and considered in my medical decision making (see chart for details).     I have discussed with the patient that we do not have what she needs here to perform enemas.  She is going to proceed to the emergency room for further evaluation and treatment that we cannot provide here in the  urgent care clinic setting.  She will receive a car  Final Clinical Impressions(s) / UC Diagnoses   Final diagnoses:  Drug-induced constipation  Abdominal pain, unspecified abdominal location     Discharge Instructions      She will go to the ER for further evaluation     ED Prescriptions   None    I have reviewed the PDMP during this encounter.   Zenia Resides, MD 10/30/23 1057

## 2023-10-30 NOTE — ED Triage Notes (Signed)
Pt states she is constipated and hasn't had a BM in 2 weeks. She has tried miralax X 3 days, MOM, and stool softeners. She states she has a hard stool at her rectum that will not come out and she came in clinic to have someone get the stool out today. She is having rectal pain.   She is on Monjouro which she is aware can cause constipation.

## 2023-10-30 NOTE — Discharge Instructions (Addendum)
She will go to the ER for further evaluation

## 2023-10-30 NOTE — Discharge Instructions (Addendum)
It was a pleasure taking care of you today.  You were evaluated in the emergency room for constipation.  A digital rectal exam was performed which did show some anal fissures.  I would recommend increasing your daily fiber, increasing fluid intake and performing sitz bath's for symptomatic relief.  You were given an enema as well Please follow-up with your PCP within the next 3 to 5 days for reevaluation.  Please continue to use to docusate and MiraLAX twice a day.  If you experience any new or worsening symptoms such as worsening abdominal pain, persistent constipation, vomiting or fevers or chills please return to the emergency room.

## 2023-10-30 NOTE — ED Triage Notes (Signed)
C/o constation, LBM:2 weeks ago.  Miralax, MOM, stool softeners, and dulcolax w/o relief.  Pt reports hard stool occasionally with rectal pain and lower abd pain.  Passing flatus

## 2023-10-30 NOTE — ED Notes (Signed)
Patient is being discharged from the Urgent Care and sent to the Emergency Department via POV . Per Loreta Ave, MD, patient is in need of higher level of care due to constipation, rectal pain. Patient is aware and verbalizes understanding of plan of care.  Vitals:   10/30/23 1038  BP: 126/78  Pulse: (!) 101  Resp: 18  Temp: 98.4 F (36.9 C)  SpO2: 99%

## 2023-11-02 ENCOUNTER — Other Ambulatory Visit (HOSPITAL_COMMUNITY): Payer: Self-pay | Admitting: *Deleted

## 2023-11-02 ENCOUNTER — Other Ambulatory Visit (HOSPITAL_COMMUNITY): Payer: Self-pay

## 2023-11-02 ENCOUNTER — Telehealth (HOSPITAL_COMMUNITY): Payer: Self-pay | Admitting: *Deleted

## 2023-11-02 MED ORDER — IVABRADINE HCL 5 MG PO TABS
ORAL_TABLET | ORAL | 0 refills | Status: DC
  Filled 2023-11-02: qty 2, 1d supply, fill #0

## 2023-11-02 NOTE — Telephone Encounter (Signed)
Reaching out to patient to offer assistance regarding upcoming cardiac imaging study; pt verbalizes understanding of appt date/time, parking situation and where to check in, pre-test NPO status and medications ordered, and verified current allergies; name and call back number provided for further questions should they arise  Gordy Clement RN Navigator Cardiac Imaging [facility] [phone] office [phone] cell  Patient to take '100mg'$  metoprolol tartrate and '10mg'$  ivabradine two hours prior to her cardiac CT scan.

## 2023-11-03 ENCOUNTER — Other Ambulatory Visit (HOSPITAL_COMMUNITY): Payer: Medicare Other

## 2023-11-03 ENCOUNTER — Ambulatory Visit (HOSPITAL_COMMUNITY)
Admission: RE | Admit: 2023-11-03 | Discharge: 2023-11-03 | Disposition: A | Discharge status: Home / Self Care | Payer: Medicare Other | Source: Ambulatory Visit | Attending: Physician Assistant | Admitting: Physician Assistant

## 2023-11-03 DIAGNOSIS — I420 Dilated cardiomyopathy: Secondary | ICD-10-CM | POA: Insufficient documentation

## 2023-11-03 MED ORDER — IOHEXOL 350 MG/ML SOLN
95.0000 mL | Freq: Once | INTRAVENOUS | Status: AC | PRN
  Administered 2023-11-03: 95 mL via INTRAVENOUS

## 2023-11-03 MED ORDER — NITROGLYCERIN 0.4 MG SL SUBL
SUBLINGUAL_TABLET | SUBLINGUAL | Status: AC
  Filled 2023-11-03: qty 2

## 2023-11-03 MED ORDER — NITROGLYCERIN 0.4 MG SL SUBL
0.8000 mg | SUBLINGUAL_TABLET | Freq: Once | SUBLINGUAL | Status: AC
  Administered 2023-11-03: 0.8 mg via SUBLINGUAL

## 2023-11-04 NOTE — Telephone Encounter (Signed)
   Patient Name: [name] [name]  DOB: [birth year] MRN: [id number]  Primary Cardiologist: Rollene Rotunda, MD  Chart reviewed as part of pre-operative protocol coverage. Given past medical history and time since last visit, based on ACC/AHA guidelines, [name] [name] is at acceptable risk for the planned procedure without further cardiovascular testing.   Recent coronary CT showed normal coronary arteries.   I will route this recommendation to the requesting party via Epic fax function and remove from pre-op pool.  Please call with questions.  [place], [place] 11/04/2023, 7:02 PM

## 2023-11-19 ENCOUNTER — Other Ambulatory Visit: Payer: Self-pay

## 2023-11-19 ENCOUNTER — Other Ambulatory Visit (HOSPITAL_COMMUNITY): Payer: Self-pay

## 2023-11-23 ENCOUNTER — Other Ambulatory Visit (HOSPITAL_COMMUNITY): Payer: Self-pay

## 2023-11-23 DIAGNOSIS — R7303 Prediabetes: Secondary | ICD-10-CM | POA: Diagnosis not present

## 2023-11-23 DIAGNOSIS — I5022 Chronic systolic (congestive) heart failure: Secondary | ICD-10-CM | POA: Diagnosis not present

## 2023-11-23 DIAGNOSIS — I10 Essential (primary) hypertension: Secondary | ICD-10-CM | POA: Diagnosis not present

## 2023-11-23 DIAGNOSIS — E785 Hyperlipidemia, unspecified: Secondary | ICD-10-CM | POA: Diagnosis not present

## 2023-11-23 DIAGNOSIS — M1990 Unspecified osteoarthritis, unspecified site: Secondary | ICD-10-CM | POA: Diagnosis not present

## 2023-11-23 MED ORDER — MOUNJARO 10 MG/0.5ML ~~LOC~~ SOAJ
10.0000 mg | SUBCUTANEOUS | 3 refills | Status: AC
  Filled 2023-11-23: qty 6, 84d supply, fill #0
  Filled 2023-11-26: qty 2, 28d supply, fill #0

## 2023-11-26 ENCOUNTER — Other Ambulatory Visit (HOSPITAL_COMMUNITY): Payer: Self-pay

## 2023-11-27 ENCOUNTER — Other Ambulatory Visit (HOSPITAL_COMMUNITY): Payer: Self-pay

## 2023-11-28 ENCOUNTER — Other Ambulatory Visit (HOSPITAL_COMMUNITY): Payer: Self-pay

## 2023-11-30 ENCOUNTER — Other Ambulatory Visit (HOSPITAL_COMMUNITY): Payer: Self-pay

## 2023-12-01 ENCOUNTER — Other Ambulatory Visit (HOSPITAL_BASED_OUTPATIENT_CLINIC_OR_DEPARTMENT_OTHER): Payer: Self-pay

## 2023-12-01 MED ORDER — WEGOVY 0.25 MG/0.5ML ~~LOC~~ SOAJ
0.2500 mg | SUBCUTANEOUS | 0 refills | Status: DC
  Filled 2023-12-01: qty 2, 28d supply, fill #0

## 2023-12-03 ENCOUNTER — Other Ambulatory Visit: Payer: Self-pay

## 2023-12-03 ENCOUNTER — Other Ambulatory Visit (HOSPITAL_BASED_OUTPATIENT_CLINIC_OR_DEPARTMENT_OTHER): Payer: Self-pay

## 2023-12-03 ENCOUNTER — Other Ambulatory Visit (HOSPITAL_COMMUNITY): Payer: Self-pay

## 2023-12-03 MED ORDER — ZEPBOUND 7.5 MG/0.5ML ~~LOC~~ SOAJ
7.5000 mg | SUBCUTANEOUS | 0 refills | Status: DC
  Filled 2023-12-03 – 2023-12-17 (×2): qty 2, 28d supply, fill #0

## 2023-12-03 MED ORDER — ZEPBOUND 10 MG/0.5ML ~~LOC~~ SOAJ
10.0000 mg | SUBCUTANEOUS | 0 refills | Status: AC
  Filled 2024-04-20: qty 2, 28d supply, fill #0
  Filled 2024-05-13: qty 2, 28d supply, fill #1
  Filled 2024-11-15: qty 2, 28d supply, fill #2

## 2023-12-04 ENCOUNTER — Other Ambulatory Visit (HOSPITAL_COMMUNITY): Payer: Self-pay

## 2023-12-09 DIAGNOSIS — M1712 Unilateral primary osteoarthritis, left knee: Secondary | ICD-10-CM | POA: Diagnosis not present

## 2023-12-10 NOTE — Progress Notes (Signed)
Sent message, via epic in basket, requesting orders in epic from surgeon.  

## 2023-12-11 ENCOUNTER — Other Ambulatory Visit (HOSPITAL_COMMUNITY): Payer: Self-pay

## 2023-12-11 DIAGNOSIS — L0592 Pilonidal sinus without abscess: Secondary | ICD-10-CM | POA: Diagnosis not present

## 2023-12-11 DIAGNOSIS — M069 Rheumatoid arthritis, unspecified: Secondary | ICD-10-CM | POA: Diagnosis not present

## 2023-12-11 DIAGNOSIS — Z713 Dietary counseling and surveillance: Secondary | ICD-10-CM | POA: Diagnosis not present

## 2023-12-11 DIAGNOSIS — I5022 Chronic systolic (congestive) heart failure: Secondary | ICD-10-CM | POA: Diagnosis not present

## 2023-12-15 ENCOUNTER — Other Ambulatory Visit (HOSPITAL_COMMUNITY): Payer: Self-pay

## 2023-12-16 NOTE — Patient Instructions (Signed)
SURGICAL WAITING ROOM VISITATION  Patients having surgery or a procedure may have no more than 2 support people in the waiting area - these visitors may rotate.    Children under the age of 49 must have an adult with them who is not the patient.  Due to an increase in RSV and influenza rates and associated hospitalizations, children ages 63 and under may not visit patients in [facility] hospitals.  Visitors with respiratory illnesses are discouraged from visiting and should remain at home.  If the patient needs to stay at the hospital during part of their recovery, the visitor guidelines for inpatient rooms apply. Pre-op nurse will coordinate an appropriate time for 1 support person to accompany patient in pre-op.  This support person may not rotate.    Please refer to the [facility] website for the visitor guidelines for Inpatients (after your surgery is over and you are in a regular room).    Your procedure is scheduled on: 12/29/23   Report to [facility] Main Entrance    Report to admitting at 5:15 AM   Call this number if you have problems the morning of surgery [phone]   Do not eat food :After Midnight.   After Midnight you may have the following liquids until 4:30 AM DAY OF SURGERY  Water Non-Citrus Juices (without pulp, NO RED-Apple, White grape, White cranberry) Black Coffee (NO MILK/CREAM OR CREAMERS, sugar ok)  Clear Tea (NO MILK/CREAM OR CREAMERS, sugar ok) regular and decaf                             Plain Jell-O (NO RED)                                           Fruit ices (not with fruit pulp, NO RED)                                     Popsicles (NO RED)                                                               Sports drinks like Gatorade (NO RED)     The day of surgery:  Drink ONE (1) Pre-Surgery G2 at 4:30 AM the morning of surgery. Drink in one sitting. Do not sip.  This drink was given to you during your hospital  pre-op appointment  visit. Nothing else to drink after completing the  Pre-Surgery G2.          If you have questions, please contact your surgeon's office.   FOLLOW BOWEL PREP AND ANY ADDITIONAL PRE OP INSTRUCTIONS YOU RECEIVED FROM YOUR SURGEON'S OFFICE!!!     Oral Hygiene is also important to reduce your risk of infection.                                    Remember - BRUSH YOUR TEETH THE MORNING OF SURGERY WITH YOUR REGULAR TOOTHPASTE  DENTURES WILL  BE REMOVED PRIOR TO SURGERY PLEASE DO NOT APPLY "Poly grip" OR ADHESIVES!!!   Stop all vitamins and herbal supplements 7 days before surgery.   Take these medicines the morning of surgery with A SIP OF WATER: Tylenol, Inhalers, Allopurinol, Carvedilol   Hold Mounjaro 7 days prior. Last dose 12/22/23 before 7:30 AM. DO NOT take 12/29/23                              You may not have any metal on your body including hair pins, jewelry, and body piercing             Do not wear make-up, lotions, powders, perfumes, or deodorant  Do not wear nail polish including gel and S&S, artificial/acrylic nails, or any other type of covering on natural nails including finger and toenails. If you have artificial nails, gel coating, etc. that needs to be removed by a nail salon please have this removed prior to surgery or surgery may need to be canceled/ delayed if the surgeon/ anesthesia feels like they are unable to be safely monitored.   Do not shave  48 hours prior to surgery.    Do not bring valuables to the hospital. [facility] IS NOT             RESPONSIBLE   FOR VALUABLES.   Contacts, glasses, dentures or bridgework may not be worn into surgery.   Bring small overnight bag day of surgery.   DO NOT BRING YOUR HOME MEDICATIONS TO THE HOSPITAL. PHARMACY WILL DISPENSE MEDICATIONS LISTED ON YOUR MEDICATION LIST TO YOU DURING YOUR ADMISSION IN THE HOSPITAL!    Patients discharged on the day of surgery will not be allowed to drive home.  Someone NEEDS to stay with you  for the first 24 hours after anesthesia.              Please read over the following fact sheets you were given: IF YOU HAVE QUESTIONS ABOUT YOUR PRE-OP INSTRUCTIONS PLEASE CALL [phone] Contras    If you received a COVID test during your pre-op visit  it is requested that you wear a mask when out in public, stay away from anyone that may not be feeling well and notify your surgeon if you develop symptoms. If you test positive for Covid or have been in contact with anyone that has tested positive in the last 10 days please notify you surgeon.      Pre-operative 5 CHG Bath Instructions   You can play a key role in reducing the risk of infection after surgery. Your skin needs to be as free of germs as possible. You can reduce the number of germs on your skin by washing with CHG (chlorhexidine gluconate) soap before surgery. CHG is an antiseptic soap that kills germs and continues to kill germs even after washing.   DO NOT use if you have an allergy to chlorhexidine/CHG or antibacterial soaps. If your skin becomes reddened or irritated, stop using the CHG and notify one of our RNs at [phone].   Please shower with the CHG soap starting 4 days before surgery using the following schedule:     Please keep in mind the following:  DO NOT shave, including legs and underarms, starting the day of your first shower.   You may shave your face at any point before/day of surgery.  Place clean sheets on your bed the day you start using CHG soap.  Use a clean washcloth (not used since being washed) for each shower. DO NOT sleep with pets once you start using the CHG.   CHG Shower Instructions:  If you choose to wash your hair and private area, wash first with your normal shampoo/soap.  After you use shampoo/soap, rinse your hair and body thoroughly to remove shampoo/soap residue.  Turn the water OFF and apply about 3 tablespoons (45 ml) of CHG soap to a CLEAN washcloth.  Apply CHG soap ONLY FROM  YOUR NECK DOWN TO YOUR TOES (washing for 3-5 minutes)  DO NOT use CHG soap on face, private areas, open wounds, or sores.  Pay special attention to the area where your surgery is being performed.  If you are having back surgery, having someone wash your back for you may be helpful. Wait 2 minutes after CHG soap is applied, then you may rinse off the CHG soap.  Pat dry with a clean towel  Put on clean clothes/pajamas   If you choose to wear lotion, please use ONLY the CHG-compatible lotions on the back of this paper.     Additional instructions for the day of surgery: DO NOT APPLY any lotions, deodorants, cologne, or perfumes.   Put on clean/comfortable clothes.  Brush your teeth.  Ask your nurse before applying any prescription medications to the skin.      CHG Compatible Lotions   Aveeno Moisturizing lotion  Cetaphil Moisturizing Cream  Cetaphil Moisturizing Lotion  Clairol Herbal Essence Moisturizing Lotion, Dry Skin  Clairol Herbal Essence Moisturizing Lotion, Extra Dry Skin  Clairol Herbal Essence Moisturizing Lotion, Normal Skin  Curel Age Defying Therapeutic Moisturizing Lotion with Alpha Hydroxy  Curel Extreme Care Body Lotion  Curel Soothing Hands Moisturizing Hand Lotion  Curel Therapeutic Moisturizing Cream, Fragrance-Free  Curel Therapeutic Moisturizing Lotion, Fragrance-Free  Curel Therapeutic Moisturizing Lotion, Original Formula  Eucerin Daily Replenishing Lotion  Eucerin Dry Skin Therapy Plus Alpha Hydroxy Crme  Eucerin Dry Skin Therapy Plus Alpha Hydroxy Lotion  Eucerin Original Crme  Eucerin Original Lotion  Eucerin Plus Crme Eucerin Plus Lotion  Eucerin TriLipid Replenishing Lotion  Keri Anti-Bacterial Hand Lotion  Keri Deep Conditioning Original Lotion Dry Skin Formula Softly Scented  Keri Deep Conditioning Original Lotion, Fragrance Free Sensitive Skin Formula  Keri Lotion Fast Absorbing Fragrance Free Sensitive Skin Formula  Keri Lotion Fast  Absorbing Softly Scented Dry Skin Formula  Keri Original Lotion  Keri Skin Renewal Lotion Keri Silky Smooth Lotion  Keri Silky Smooth Sensitive Skin Lotion  Nivea Body Creamy Conditioning Oil  Nivea Body Extra Enriched Lotion  Nivea Body Original Lotion  Nivea Body Sheer Moisturizing Lotion Nivea Crme  Nivea Skin Firming Lotion  NutraDerm 30 Skin Lotion  NutraDerm Skin Lotion  NutraDerm Therapeutic Skin Cream  NutraDerm Therapeutic Skin Lotion  ProShield Protective Hand Cream  Provon moisturizing lotion   Incentive Spirometer  An incentive spirometer is a tool that can help keep your lungs clear and active. This tool measures how well you are filling your lungs with each breath. Taking long deep breaths may help reverse or decrease the chance of developing breathing (pulmonary) problems (especially infection) following: A long period of time when you are unable to move or be active. BEFORE THE PROCEDURE  If the spirometer includes an indicator to show your best effort, your nurse or respiratory therapist will set it to a desired goal. If possible, sit up straight or lean slightly forward. Try not to slouch. Hold the incentive spirometer in an upright  position. INSTRUCTIONS FOR USE  Sit on the edge of your bed if possible, or sit up as far as you can in bed or on a chair. Hold the incentive spirometer in an upright position. Breathe out normally. Place the mouthpiece in your mouth and seal your lips tightly around it. Breathe in slowly and as deeply as possible, raising the piston or the ball toward the top of the column. Hold your breath for 3-5 seconds or for as long as possible. Allow the piston or ball to fall to the bottom of the column. Remove the mouthpiece from your mouth and breathe out normally. Rest for a few seconds and repeat Steps 1 through 7 at least 10 times every 1-2 hours when you are awake. Take your time and take a few normal breaths between deep breaths. The  spirometer may include an indicator to show your best effort. Use the indicator as a goal to work toward during each repetition. After each set of 10 deep breaths, practice coughing to be sure your lungs are clear. If you have an incision (the cut made at the time of surgery), support your incision when coughing by placing a pillow or rolled up towels firmly against it. Once you are able to get out of bed, walk around indoors and cough well. You may stop using the incentive spirometer when instructed by your caregiver.  RISKS AND COMPLICATIONS Take your time so you do not get dizzy or light-headed. If you are in pain, you may need to take or ask for pain medication before doing incentive spirometry. It is harder to take a deep breath if you are having pain. AFTER USE Rest and breathe slowly and easily. It can be helpful to keep track of a log of your progress. Your caregiver can provide you with a simple table to help with this. If you are using the spirometer at home, follow these instructions: SEEK MEDICAL CARE IF:  You are having difficultly using the spirometer. You have trouble using the spirometer as often as instructed. Your pain medication is not giving enough relief while using the spirometer. You develop fever of 100.5 F (38.1 C) or higher. SEEK IMMEDIATE MEDICAL CARE IF:  You cough up bloody sputum that had not been present before. You develop fever of 102 F (38.9 C) or greater. You develop worsening pain at or near the incision site. MAKE SURE YOU:  Understand these instructions. Will watch your condition. Will get help right away if you are not doing well or get worse. Document Released: 03/23/2007 Document Revised: 02/02/2012 Document Reviewed: 05/24/2007 ExitCare Patient Information 2014 [place] [employer].   ________________________________________________________________________View Pre-Surgery Education  Videos:  [email or web address]

## 2023-12-16 NOTE — Progress Notes (Signed)
COVID Vaccine Completed:  Date of COVID positive in last 90 days:  PCP - Jorge Ny, FNP Cardiologist - Rollene Rotunda, MD  Medical clearance and office notes in media tab Cardiac clearance by Azalee Course, PA 09/29/23 in Epic   CT- 11/03/23 Epic Chest x-ray -  EKG - 09/14/23 Epic Stress Test -  ECHO - 09/14/23 Epic Cardiac Cath -  Pacemaker/ICD device last checked: Spinal Cord Stimulator:  Bowel Prep -   Sleep Study -  CPAP -   Fasting Blood Sugar - preDM  Checks Blood Sugar _____ times a day  Last dose of GLP1 agonist-   GLP1 instructions:  Hold 7 days before surgery    Last dose of SGLT-2 inhibitors-  N/A SGLT-2 instructions:  Hold 3 days before surgery    Blood Thinner Instructions:  Time Aspirin Instructions: Last Dose:  Activity level:  Can go up a flight of stairs and perform activities of daily living without stopping and without symptoms of chest pain or shortness of breath.  Able to exercise without symptoms  Unable to go up a flight of stairs without symptoms of     Anesthesia review: HTN, LBBB, CHF, cardiomyopathy, OSA,   Patient denies shortness of breath, fever, cough and chest pain at PAT appointment  Patient verbalized understanding of instructions that were given to them at the PAT appointment. Patient was also instructed that they will need to review over the PAT instructions again at home before surgery.

## 2023-12-17 ENCOUNTER — Encounter (HOSPITAL_COMMUNITY)
Admission: RE | Admit: 2023-12-17 | Discharge: 2023-12-17 | Disposition: A | Discharge status: Home / Self Care | Payer: Medicare Other | Source: Ambulatory Visit | Attending: Orthopedic Surgery | Admitting: Orthopedic Surgery

## 2023-12-17 ENCOUNTER — Other Ambulatory Visit (HOSPITAL_COMMUNITY): Payer: Self-pay

## 2023-12-17 ENCOUNTER — Encounter (HOSPITAL_COMMUNITY): Payer: Self-pay

## 2023-12-17 ENCOUNTER — Other Ambulatory Visit: Payer: Self-pay

## 2023-12-17 VITALS — BP 122/90 | HR 98 | Temp 97.8°F | Resp 18 | Ht 63.0 in | Wt 190.0 lb

## 2023-12-17 DIAGNOSIS — G4730 Sleep apnea, unspecified: Secondary | ICD-10-CM | POA: Insufficient documentation

## 2023-12-17 DIAGNOSIS — Z01818 Encounter for other preprocedural examination: Secondary | ICD-10-CM

## 2023-12-17 DIAGNOSIS — M1712 Unilateral primary osteoarthritis, left knee: Secondary | ICD-10-CM | POA: Insufficient documentation

## 2023-12-17 DIAGNOSIS — I447 Left bundle-branch block, unspecified: Secondary | ICD-10-CM | POA: Insufficient documentation

## 2023-12-17 DIAGNOSIS — I110 Hypertensive heart disease with heart failure: Secondary | ICD-10-CM | POA: Insufficient documentation

## 2023-12-17 DIAGNOSIS — Z01812 Encounter for preprocedural laboratory examination: Secondary | ICD-10-CM | POA: Diagnosis not present

## 2023-12-17 DIAGNOSIS — I10 Essential (primary) hypertension: Secondary | ICD-10-CM

## 2023-12-17 DIAGNOSIS — I509 Heart failure, unspecified: Secondary | ICD-10-CM | POA: Diagnosis not present

## 2023-12-17 LAB — CBC
HCT: 39.3 % (ref 36.0–46.0)
Hemoglobin: 12.7 g/dL (ref 12.0–15.0)
MCH: 28.9 pg (ref 26.0–34.0)
MCHC: 32.3 g/dL (ref 30.0–36.0)
MCV: 89.5 fL (ref 80.0–100.0)
Platelets: 248 10*3/uL (ref 150–400)
RBC: 4.39 MIL/uL (ref 3.87–5.11)
RDW: 13.9 % (ref 11.5–15.5)
WBC: 4.6 10*3/uL (ref 4.0–10.5)
nRBC: 0 % (ref 0.0–0.2)

## 2023-12-17 LAB — SURGICAL PCR SCREEN
MRSA, PCR: NEGATIVE
Staphylococcus aureus: NEGATIVE

## 2023-12-17 LAB — BASIC METABOLIC PANEL
Anion gap: 13 (ref 5–15)
BUN: 15 mg/dL (ref 8–23)
CO2: 25 mmol/L (ref 22–32)
Calcium: 9.7 mg/dL (ref 8.9–10.3)
Chloride: 101 mmol/L (ref 98–111)
Creatinine, Ser: 0.8 mg/dL (ref 0.44–1.00)
GFR, Estimated: >60 mL/min (ref >60)
Glucose, Bld: 97 mg/dL (ref 70–99)
Potassium: 3.5 mmol/L (ref 3.5–5.1)
Sodium: 139 mmol/L (ref 135–145)

## 2023-12-18 NOTE — Progress Notes (Signed)
Anesthesia Chart Review   Case: [id number] Date/Time: 12/29/23 0715   Procedure: TOTAL KNEE ARTHROPLASTY (Left: Knee)   Anesthesia type: Spinal   Pre-op diagnosis: OA LEFT KNEE   Location: [facility] / [facility]   Surgeons: Sheral Apley, MD       DISCUSSION:63 y.o. never smoker with h/o HTN, sleep apnea with CPAP, LBBB, CHF, left knee OA scheduled for above procedure 12/29/2023 with Dr. Margarita Rana.   Pt last seen by cardiology 09/29/2023. Per OV note coronary CT ordered for preoperative risk assessment.   CT Coronary 11/03/2023 IMPRESSION: 1. No evidence of CAD, 0% stenosis, CADRADS 0.   2. Total plaque volume 0 mm3.   3. Coronary calcium score of 0.   4. Normal coronary origins with right dominance.   RECOMMENDATIONS: CAD-RADS 0. No evidence of CAD (0%). Consider non-atherosclerotic causes of chest pain.  Per cardiology notes 11/04/2023, "Chart reviewed as part of pre-operative protocol coverage. Given past medical history and time since last visit, based on ACC/AHA guidelines, [name] [name] is at acceptable risk for the planned procedure without further cardiovascular testing.    Recent coronary CT showed normal coronary arteries."  VS: BP (!) 122/90   Pulse 98   Temp 36.6 C (Oral)   Resp 18   Ht 5\' 3"  (1.6 m)   Wt 86.2 kg   SpO2 100%   BMI 33.66 kg/m   PROVIDERS: Camie Patience, FNP is PCP    LABS: Labs reviewed: Acceptable for surgery. (all labs ordered are listed, but only abnormal results are displayed)  Labs Reviewed  SURGICAL PCR SCREEN  BASIC METABOLIC PANEL  CBC     IMAGES:   EKG:   CV: Echo 09/14/2023 1. Left ventricular ejection fraction, by estimation, is 45 to 50%. The  left ventricle has mildly decreased function. The left ventricle  demonstrates regional wall motion abnormalities (see scoring  diagram/findings for description). Left ventricular  diastolic parameters are indeterminate. There is hypokinesis of  the left  ventricular, basal-mid septal wall. There is dyskinesis of the left  ventricular, entire inferior wall. The average left ventricular global  longitudinal strain is -13.9 %. The global   longitudinal strain is abnormal.   2. Right ventricular systolic function is normal. The right ventricular  size is normal. There is normal pulmonary artery systolic pressure.   3. The mitral valve is normal in structure. Trivial mitral valve  regurgitation. No evidence of mitral stenosis.   4. The aortic valve is tricuspid. There is mild calcification of the  aortic valve. There is mild thickening of the aortic valve. Aortic valve  regurgitation is not visualized. Aortic valve sclerosis is present, with  no evidence of aortic valve stenosis.   5. Aortic dilatation noted. There is borderline dilatation of the  ascending aorta, measuring 38 mm.   6. The inferior vena cava is normal in size with greater than 50%  respiratory variability, suggesting right atrial pressure of 3 mmHg.  Past Medical History:  Diagnosis Date   Asthma    CHF (congestive heart failure) (HCC)    Hypertension    Joint pain    Lumbar herniated disc    OA (osteoarthritis)    Obesity    Pre-diabetes    Rheumatoid arthritis (HCC)    Sleep apnea    CPAP   SOB (shortness of breath)     Past Surgical History:  Procedure Laterality Date   BREAST SURGERY     breast reduction  LAPAROSCOPIC GASTRIC SLEEVE RESECTION N/A 09/29/2016   Procedure: LAPAROSCOPIC GASTRIC SLEEVE RESECTION, UPPER ENDO;  Surgeon: Gaynelle Adu, MD;  Location: [facility];  Service: General;  Laterality: N/A;   REDUCTION MAMMAPLASTY Bilateral 2003   removed bone from left knee      UPPER GI ENDOSCOPY  09/29/2016   Procedure: UPPER GI ENDOSCOPY;  Surgeon: Gaynelle Adu, MD;  Location: [facility];  Service: General;;    MEDICATIONS:  acetaminophen (TYLENOL) 650 MG CR tablet   albuterol (PROVENTIL) (2.5 MG/3ML) 0.083% nebulizer solution   albuterol (VENTOLIN  HFA) 108 (90 Base) MCG/ACT inhaler   allopurinol (ZYLOPRIM) 100 MG tablet   atorvastatin (LIPITOR) 40 MG tablet   carvedilol (COREG) 6.25 MG tablet   Cholecalciferol (VITAMIN D3) 1000 units CAPS   furosemide (LASIX) 20 MG tablet   potassium chloride (KLOR-CON M) 10 MEQ tablet   predniSONE (DELTASONE) 10 MG tablet   Semaglutide-Weight Management (WEGOVY) 0.25 MG/0.5ML SOAJ   tirzepatide (MOUNJARO) 10 MG/0.5ML Pen   tirzepatide (ZEPBOUND) 10 MG/0.5ML Pen   tirzepatide (ZEPBOUND) 7.5 MG/0.5ML Pen   valACYclovir (VALTREX) 500 MG tablet   No current facility-administered medications for this encounter.     Jodell Cipro Ward, PA-C [facility] Pre-Surgical Testing [phone]

## 2023-12-18 NOTE — Anesthesia Preprocedure Evaluation (Addendum)
Anesthesia Evaluation  Patient identified by MRN, date of birth, ID band Patient awake    Reviewed: Allergy & Precautions, NPO status , Patient's Chart, lab work & pertinent test results, reviewed documented beta blocker date and time   Airway Mallampati: I  TM Distance: >3 FB Neck ROM: Full    Dental no notable dental hx. (+) Dental Advisory Given, Teeth Intact   Pulmonary asthma , sleep apnea    Pulmonary exam normal breath sounds clear to auscultation       Cardiovascular hypertension, Pt. on medications and Pt. on home beta blockers +CHF  Normal cardiovascular exam+ dysrhythmias  Rhythm:Regular Rate:Normal  Echo 08/2023  1. Left ventricular ejection fraction, by estimation, is 45 to 50%. The left ventricle has mildly decreased function. The left ventricle demonstrates regional wall motion abnormalities (see scoring diagram/findings for description). Left ventricular diastolic parameters are indeterminate. There is hypokinesis of the left ventricular, basal-mid septal wall. There is dyskinesis of the left ventricular, entire inferior wall. The average left ventricular global longitudinal strain is -13.9 %. The global longitudinal strain is abnormal.   2. Right ventricular systolic function is normal. The right ventricular size is normal. There is normal pulmonary artery systolic pressure.   3. The mitral valve is normal in structure. Trivial mitral valve regurgitation. No evidence of mitral stenosis.   4. The aortic valve is tricuspid. There is mild calcification of the aortic valve. There is mild thickening of the aortic valve. Aortic valve regurgitation is not visualized. Aortic valve sclerosis is present, with no evidence of aortic valve stenosis.   5. Aortic dilatation noted. There is borderline dilatation of the ascending aorta, measuring 38 mm.   6. The inferior vena cava is normal in size with greater than 50% respiratory  variability, suggesting right atrial pressure of 3 mmHg.   Comparison(s): Changes from prior study are noted. Significant LV dyssynchrony, gives appearance of wall motion abnormalities, but all segments thicken normally except for basal-mid septal wall. Inferior wall appears dyskinetic due to dyssynchrony.      Neuro/Psych negative neurological ROS  negative psych ROS   GI/Hepatic negative GI ROS, Neg liver ROS,,,  Endo/Other    Class 3 obesity  Renal/GU negative Renal ROS     Musculoskeletal  (+) Arthritis ,    Abdominal  (+) + obese  Peds  Hematology negative hematology ROS (+)   Anesthesia Other Findings   Reproductive/Obstetrics negative OB ROS                              Anesthesia Physical Anesthesia Plan  ASA: 3  Anesthesia Plan:    Post-op Pain Management: Regional block* and Tylenol PO (pre-op)*   Induction: Intravenous  PONV Risk Score and Plan: 3 and Ondansetron, Treatment may vary due to age or medical condition, Dexamethasone, Propofol infusion and TIVA  Airway Management Planned:   Additional Equipment:   Intra-op Plan:   Post-operative Plan:   Informed Consent: I have reviewed the patients History and Physical, chart, labs and discussed the procedure including the risks, benefits and alternatives for the proposed anesthesia with the patient or authorized representative who has indicated his/her understanding and acceptance.     Dental advisory given  Plan Discussed with: CRNA  Anesthesia Plan Comments: (See PAT note 12/17/2023   Risks of anesthesia explained at length. This includes, but is not limited to, sore throat, damage to teeth, lips gums, tongue and vocal cords, nausea  and vomiting, reactions to medications, stroke, heart attack, and death. All patient questions were answered and the patient wishes to proceed. Risks of spinal and peripheral nerve block explained at length. This includes, but is not limited  to, bleeding, infection, reactions to the medications, seizures, damage to surrounding structures, damage to nerves, permanent weakness, numbness, tingling and pain. All patient questions were answered and patient wishes to proceed with spinal and nerve block. )         Anesthesia Quick Evaluation

## 2023-12-21 ENCOUNTER — Other Ambulatory Visit (HOSPITAL_COMMUNITY): Payer: Self-pay

## 2023-12-21 MED ORDER — MUPIROCIN 2 % EX OINT
TOPICAL_OINTMENT | CUTANEOUS | 0 refills | Status: AC
  Filled 2023-12-21: qty 66, 30d supply, fill #0

## 2023-12-22 NOTE — H&P (Signed)
KNEE ARTHROPLASTY ADMISSION H&P  Patient ID: [name] [name] MRN: [id number] DOB/AGE: 08-10-1961 63 y.o.  Chief Complaint: left knee pain.  Planned Procedure Date: 12/29/23 Medical Clearance by Otho Perl NP   Cardiac Clearance by Azalee Course PA-C   HPI: [name] [name] is a 63 y.o. female who presents for evaluation of OA LEFT KNEE. The patient has a history of pain and functional disability in the left knee due to arthritis and has failed non-surgical conservative treatments for greater than 12 weeks to include NSAID's and/or analgesics, corticosteriod injections, viscosupplementation injections, use of assistive devices, and activity modification.  Onset of symptoms was gradual, starting 2 years ago with gradually worsening course since that time. The patient noted prior procedures on the knee to include  arthroscopy and menisectomy on the left knee.  Patient currently rates pain at 9 out of 10 with activity. Patient has night pain, worsening of pain with activity and weight bearing, and pain that interferes with activities of daily living.  Patient has evidence of subchondral sclerosis, periarticular osteophytes, and joint space narrowing by imaging studies.  There is no active infection.  Past Medical History:  Diagnosis Date   Asthma    CHF (congestive heart failure) (HCC)    Hypertension    Joint pain    Lumbar herniated disc    OA (osteoarthritis)    Obesity    Pre-diabetes    Rheumatoid arthritis (HCC)    Sleep apnea    CPAP   SOB (shortness of breath)    Past Surgical History:  Procedure Laterality Date   BREAST SURGERY     breast reduction    LAPAROSCOPIC GASTRIC SLEEVE RESECTION N/A 09/29/2016   Procedure: LAPAROSCOPIC GASTRIC SLEEVE RESECTION, UPPER ENDO;  Surgeon: Gaynelle Adu, MD;  Location: [facility];  Service: General;  Laterality: N/A;   REDUCTION MAMMAPLASTY Bilateral 2003   removed bone from left knee      UPPER GI ENDOSCOPY  09/29/2016    Procedure: UPPER GI ENDOSCOPY;  Surgeon: Gaynelle Adu, MD;  Location: [facility];  Service: General;;   Allergies  Allergen Reactions   Penicillins Anaphylaxis, Hives, Shortness Of Breath, Nausea And Vomiting, Itching and Other (See Comments)    Has patient had a PCN reaction causing immediate rash, facial/tongue/throat swelling, SOB or lightheadedness with hypotension: yes  Has patient had a PCN reaction causing severe rash involving mucus membranes or skin necrosis: yes  Has patient had a PCN reaction that required hospitalization no  Has patient had a PCN reaction occurring within the last 10 years: yes  If all of the above answers are "NO", then may proceed with Cephalosporin use.  Upset stomach  Has patient had a PCN reaction causing immediate rash, facial/tongue/throat swelling, SOB or lightheadedness with hypotension: yes  Has patient had a PCN reaction causing severe rash involving mucus membranes or skin necrosis: yes  Has patient had a PCN reaction that required hospitalization no  Has patient had a PCN reaction occurring within the last 10 years: yes  If all of the above answers are "NO", then may proceed with Cephalosporin use.  Other reaction(s): Shortness Of Breath  Has patient had a PCN reaction causing immediate rash, facial/tongue/throat swelling, SOB or lightheadedness with hypotension: yes  Has patient had a PCN reaction causing severe rash involving mucus membranes or skin necrosis: yes  Has patient had a PCN reaction that required hospitalization no  Has patient had a PCN reaction occurring within the last 10 years:  yes  If all of the above answers are "NO", then may proceed with Cephalosporin use.  Upset stomach  Other reaction(s): Shortness Of Breath, Has patient had a PCN reaction causing immediate rash, facial/tongue/throat swelling, SOB or lightheadedness with hypotension: yes, Has patient had a PCN reaction causing severe rash involving mucus membranes or skin  necrosis: yes, Has patient had a PCN reaction that required hospitalization no, Has patient had a PCN reaction occurring within the last 10 years: yes, If all of the above answers are "NO", then may proceed with Cephalosporin use.  Upset stomach   Amoxicillin Other (See Comments)    Upset stomach   Tetanus-Diphth-Acell Pertussis Rash and Other (See Comments)    Headache /dehydration/rash/itching   Celecoxib Other (See Comments)   Tdap [Tetanus-Diphth-Acell Pertussis] Other (See Comments)    Headache /dehydration   Prior to Admission medications   Medication Sig Start Date End Date Taking? Authorizing Provider  acetaminophen (TYLENOL) 650 MG CR tablet Take 650 mg by mouth every 8 (eight) hours as needed (arthritis pain).   Yes [provider]  albuterol (PROVENTIL) (2.5 MG/3ML) 0.083% nebulizer solution Use 1 vial (3 mLs) by nebulization every 4 hours as needed for 10 days. 03/10/22  Yes   albuterol (VENTOLIN HFA) 108 (90 Base) MCG/ACT inhaler Inhale 2 puffs into the lungs every 4 (four) hours as needed. 03/10/22  Yes   carvedilol (COREG) 6.25 MG tablet Take 6.25 mg by mouth 2 (two) times daily with a meal.   Yes [provider]  Cholecalciferol (VITAMIN D3) 1000 units CAPS Take 1,000 Units by mouth once.   Yes [provider]  furosemide (LASIX) 20 MG tablet Take 20 mg by mouth daily.   Yes [provider]  potassium chloride (KLOR-CON M) 10 MEQ tablet Take 10 mEq by mouth daily. 08/18/23  Yes [provider]  predniSONE (DELTASONE) 10 MG tablet Take 1 tablet (10 mg total) by mouth daily. Patient taking differently: Take 10 mg by mouth daily as needed (Arthritis). 10/14/23  Yes   tirzepatide (MOUNJARO) 10 MG/0.5ML Pen Inject 10 mg into the skin once a week. 11/23/23  Yes   valACYclovir (VALTREX) 500 MG tablet TAKE 1 TABLET BY MOUTH EVERY DAY Patient taking differently: Take 500 mg by mouth daily as needed (Cold sores). 06/14/15  Yes Le, Thao P, DO   allopurinol (ZYLOPRIM) 100 MG tablet Take 0.5 tablets (50 mg total) by mouth daily. 10/14/23     atorvastatin (LIPITOR) 40 MG tablet Take 1 tablet (40 mg total) by mouth daily. Patient not taking: Reported on 12/11/2023 09/29/23   Azalee Course, PA  mupirocin ointment (BACTROBAN) 2 % Apply to affected area 2 times a day as needed for 7 days 12/21/23     Semaglutide-Weight Management (WEGOVY) 0.25 MG/0.5ML SOAJ Inject 0.25 mg into the skin once a week. Patient not taking: Reported on 12/11/2023 11/30/23     tirzepatide (ZEPBOUND) 10 MG/0.5ML Pen Inject 10 mg into the skin once a week. 12/03/23     tirzepatide (ZEPBOUND) 7.5 MG/0.5ML Pen Inject 7.5 mg into the skin once a week. 12/03/23      Social History   Socioeconomic History   Marital status: Married    Spouse name: Not on file   Number of children: Not on file   Years of education: Not on file   Highest education level: Not on file  Occupational History   Occupation: Customer Service  Tobacco Use   Smoking status: Never   Smokeless  tobacco: Never  Vaping Use   Vaping status: Never Used  Substance and Sexual Activity   Alcohol use: No    Alcohol/week: 0.0 standard drinks of alcohol   Drug use: No   Sexual activity: Yes    Birth control/protection: None  Other Topics Concern   Not on file  Social History Narrative   Not on file   Social Drivers of Health   Financial Resource Strain: Not on file  Food Insecurity: No Food Insecurity (10/06/2017)   Hunger Vital Sign    Worried About Running Out of Food in the Last Year: Never true    Ran Out of Food in the Last Year: Never true  Transportation Needs: Not on file  Physical Activity: Not on file  Stress: Not on file  Social Connections: Unknown (04/08/2022)   Received from [facility]   Social Network    Social Network: Not on file   Family History  Problem Relation Age of Onset   Ovarian cancer Mother 21       Not sure of this diagnosis   Obesity Mother     Diabetes Brother    Hypertension Brother    Breast cancer Neg Hx     ROS: Currently denies lightheadedness, dizziness, Fever, chills, CP, SOB.   No personal history of DVT, PE, MI, or CVA. No loose teeth or dentures All other systems have been reviewed and were otherwise currently negative with the exception of those mentioned in the HPI and as above.  Objective: Vitals: Ht: 5'3" Wt: 193.4 lbs Temp: 98.6 BP: 126/84 Pulse: 110 O2 99% on room air.   Physical Exam: General: Alert, NAD.  Antalgic Gait  HEENT: EOMI, Good Neck Extension  Pulm: No increased work of breathing.  Clear B/L A/P w/o crackle or wheeze.  CV: Tachycardic rate with RR, No m/g/r appreciated  GI: soft, NT, ND. BS x 4 quadrants Neuro: CN II-XII grossly intact without focal deficit.  Sensation intact distally Skin: No lesions in the area of chief complaint MSK/Surgical Site:  + JLT. ROM slow and limited to 20-110 degrees.  Decreased strength in extension and flexion.  +EHL/FHL.  NVI.  Pain and instability with varus and valgus stress. Effusion present.   Imaging Review Plain radiographs demonstrate severe degenerative joint disease of the bilateral knees.   The overall alignment issignificant varus. The bone quality appears to be fair for age and reported activity level.  Preoperative templating of the joint replacement has been completed, documented, and submitted to the Operating Room personnel in order to optimize intra-operative equipment management.  Assessment: OA LEFT KNEE Active Problems:   * No active hospital problems. *   Plan: Plan for Procedure(s): TOTAL KNEE ARTHROPLASTY  The patient history, physical exam, clinical judgement of the provider and imaging are consistent with end stage degenerative joint disease and total joint arthroplasty is deemed medically necessary. The treatment options including medical management, injection therapy, and arthroplasty were discussed at length. The risks and  benefits of Procedure(s): TOTAL KNEE ARTHROPLASTY were presented and reviewed.  The risks of nonoperative treatment, versus surgical intervention including but not limited to continued pain, aseptic loosening, stiffness, dislocation/subluxation, infection, bleeding, nerve injury, blood clots, cardiopulmonary complications, morbidity, mortality, among others were discussed. The patient verbalizes understanding and wishes to proceed with the plan.  Patient is being admitted for inpatient treatment for surgery, pain control, PT, prophylactic antibiotics, VTE prophylaxis, progressive ambulation, ADL's and discharge planning.   Dental prophylaxis discussed and recommended  for 2 years postoperatively.  The patient does meet the criteria for TXA which will be used perioperatively.   ASA 81 mg BID will be used postoperatively for DVT prophylaxis in addition to SCDs, and early ambulation. Plan for Tylenol, Diclofenac, Oxycodone for pain.   Robaxin for muscle spasms.   Zofran for nausea and vomiting. Colace is for constipation prevention. Pharmacy- [facility]  The patient is planning to be discharged home with OPPT and into the care of her husband Chrissie Noa who can be reached at [phone] Follow up appt 01/13/24 at 4:15pm    Marzetta Board Office [phone] 12/22/2023 10:10 AM

## 2023-12-24 NOTE — Care Plan (Signed)
Ortho Bundle Case Management Note  Patient Details  Name: [name] [name] [name] [name] MRN: [id number] Date of Birth: [birth year]   spoke with patient . will discharge to home with family to assist. has rolling walker. CPM ordered. [facility] up [facility] will have at least 4 visits at home with our therapist prior to starting in the clinic.  discharge instructions discussed and questions answered. Patient and MD in agreement with plan. Choice offered.    DME Arranged:  CPM DME Agency:  [employer]  HH Arranged:    HH Agency:     Additional Comments: Please contact me with any questions of if this plan should need to change.  Shauna Hugh,  RN,BSN,MHA,CCM  [facility]  [phone] 12/24/2023, 8:42 AM

## 2023-12-29 ENCOUNTER — Ambulatory Visit (HOSPITAL_COMMUNITY): Payer: Medicare Other

## 2023-12-29 ENCOUNTER — Encounter (HOSPITAL_COMMUNITY): Payer: Self-pay | Admitting: Orthopedic Surgery

## 2023-12-29 ENCOUNTER — Ambulatory Visit (HOSPITAL_COMMUNITY)
Admission: RE | Admit: 2023-12-29 | Discharge: 2023-12-29 | Disposition: A | Discharge status: Home / Self Care | Payer: Medicare Other | Attending: Orthopedic Surgery | Admitting: Orthopedic Surgery

## 2023-12-29 ENCOUNTER — Encounter (HOSPITAL_COMMUNITY)
Admission: RE | Disposition: A | Discharge status: Home / Self Care | Payer: Self-pay | Source: Home / Self Care | Attending: Orthopedic Surgery

## 2023-12-29 ENCOUNTER — Ambulatory Visit (HOSPITAL_COMMUNITY): Payer: Medicare Other | Admitting: Physician Assistant

## 2023-12-29 ENCOUNTER — Ambulatory Visit (HOSPITAL_BASED_OUTPATIENT_CLINIC_OR_DEPARTMENT_OTHER): Payer: Medicare Other | Admitting: Certified Registered Nurse Anesthetist

## 2023-12-29 ENCOUNTER — Other Ambulatory Visit (HOSPITAL_COMMUNITY): Payer: Self-pay

## 2023-12-29 DIAGNOSIS — I509 Heart failure, unspecified: Secondary | ICD-10-CM | POA: Insufficient documentation

## 2023-12-29 DIAGNOSIS — M1712 Unilateral primary osteoarthritis, left knee: Secondary | ICD-10-CM | POA: Diagnosis not present

## 2023-12-29 DIAGNOSIS — G4730 Sleep apnea, unspecified: Secondary | ICD-10-CM | POA: Insufficient documentation

## 2023-12-29 DIAGNOSIS — Z9884 Bariatric surgery status: Secondary | ICD-10-CM | POA: Insufficient documentation

## 2023-12-29 DIAGNOSIS — Z471 Aftercare following joint replacement surgery: Secondary | ICD-10-CM | POA: Diagnosis not present

## 2023-12-29 DIAGNOSIS — E66813 Obesity, class 3: Secondary | ICD-10-CM | POA: Insufficient documentation

## 2023-12-29 DIAGNOSIS — G8918 Other acute postprocedural pain: Secondary | ICD-10-CM | POA: Diagnosis not present

## 2023-12-29 DIAGNOSIS — Z7952 Long term (current) use of systemic steroids: Secondary | ICD-10-CM | POA: Insufficient documentation

## 2023-12-29 DIAGNOSIS — J45909 Unspecified asthma, uncomplicated: Secondary | ICD-10-CM | POA: Insufficient documentation

## 2023-12-29 DIAGNOSIS — I110 Hypertensive heart disease with heart failure: Secondary | ICD-10-CM | POA: Insufficient documentation

## 2023-12-29 DIAGNOSIS — Z7985 Long-term (current) use of injectable non-insulin antidiabetic drugs: Secondary | ICD-10-CM | POA: Diagnosis not present

## 2023-12-29 DIAGNOSIS — Z96652 Presence of left artificial knee joint: Secondary | ICD-10-CM

## 2023-12-29 DIAGNOSIS — Z6833 Body mass index (BMI) 33.0-33.9, adult: Secondary | ICD-10-CM | POA: Diagnosis not present

## 2023-12-29 DIAGNOSIS — R609 Edema, unspecified: Secondary | ICD-10-CM | POA: Diagnosis not present

## 2023-12-29 DIAGNOSIS — M069 Rheumatoid arthritis, unspecified: Secondary | ICD-10-CM | POA: Insufficient documentation

## 2023-12-29 HISTORY — PX: TOTAL KNEE ARTHROPLASTY: SHX125

## 2023-12-29 SURGERY — ARTHROPLASTY, KNEE, TOTAL
Anesthesia: Spinal | Site: Knee | Laterality: Left

## 2023-12-29 MED ORDER — CLONIDINE HCL (ANALGESIA) 100 MCG/ML EP SOLN
EPIDURAL | Status: DC | PRN
  Administered 2023-12-29: 80 ug

## 2023-12-29 MED ORDER — DROPERIDOL 2.5 MG/ML IJ SOLN: 0.6250 mg | Freq: Once | INTRAMUSCULAR | Status: DC | PRN

## 2023-12-29 MED ORDER — OXYCODONE HCL 5 MG PO TABS: 5.0000 mg | ORAL_TABLET | ORAL | Status: DC | PRN

## 2023-12-29 MED ORDER — DICLOFENAC SODIUM 75 MG PO TBEC
75.0000 mg | DELAYED_RELEASE_TABLET | Freq: Two times a day (BID) | ORAL | 2 refills | Status: DC | PRN
  Filled 2023-12-29: qty 60, 30d supply, fill #0

## 2023-12-29 MED ORDER — ONDANSETRON 4 MG PO TBDP
4.0000 mg | ORAL_TABLET | Freq: Three times a day (TID) | ORAL | 0 refills | Status: AC | PRN
  Filled 2023-12-29: qty 15, 5d supply, fill #0

## 2023-12-29 MED ORDER — PROPOFOL 500 MG/50ML IV EMUL
INTRAVENOUS | Status: DC | PRN
  Administered 2023-12-29: 75 ug/kg/min via INTRAVENOUS

## 2023-12-29 MED ORDER — DEXAMETHASONE SODIUM PHOSPHATE 10 MG/ML IJ SOLN
8.0000 mg | Freq: Once | INTRAMUSCULAR | Status: AC
  Administered 2023-12-29: 8 mg via INTRAVENOUS

## 2023-12-29 MED ORDER — POVIDONE-IODINE 10 % EX SWAB: 2.0000 | Freq: Once | CUTANEOUS | Status: DC

## 2023-12-29 MED ORDER — BUPIVACAINE LIPOSOME 1.3 % IJ SUSP: 20.0000 mL | Freq: Once | INTRAMUSCULAR | Status: DC

## 2023-12-29 MED ORDER — PHENYLEPHRINE 80 MCG/ML (10ML) SYRINGE FOR IV PUSH (FOR BLOOD PRESSURE SUPPORT)
PREFILLED_SYRINGE | INTRAVENOUS | Status: DC | PRN
  Administered 2023-12-29 (×2): 120 ug via INTRAVENOUS

## 2023-12-29 MED ORDER — DEXMEDETOMIDINE HCL IN NACL 80 MCG/20ML IV SOLN
INTRAVENOUS | Status: AC
Start: 2023-12-29 — End: ?
  Filled 2023-12-29: qty 20

## 2023-12-29 MED ORDER — OXYCODONE HCL 5 MG PO TABS
ORAL_TABLET | ORAL | Status: AC
  Administered 2023-12-29: 5 mg via ORAL
  Filled 2023-12-29: qty 2

## 2023-12-29 MED ORDER — CHLORHEXIDINE GLUCONATE 0.12 % MT SOLN
15.0000 mL | Freq: Once | OROMUCOSAL | Status: AC
  Administered 2023-12-29: 15 mL via OROMUCOSAL

## 2023-12-29 MED ORDER — METHOCARBAMOL 500 MG PO TABS: 500.0000 mg | ORAL_TABLET | Freq: Four times a day (QID) | ORAL | Status: DC | PRN

## 2023-12-29 MED ORDER — PROPOFOL 10 MG/ML IV BOLUS
INTRAVENOUS | Status: DC | PRN
  Administered 2023-12-29: 150 mg via INTRAVENOUS
  Administered 2023-12-29: 30 mg via INTRAVENOUS
  Administered 2023-12-29: 20 mg via INTRAVENOUS

## 2023-12-29 MED ORDER — DEXAMETHASONE SODIUM PHOSPHATE 4 MG/ML IJ SOLN
INTRAMUSCULAR | Status: DC | PRN
  Administered 2023-12-29: 5 mg via PERINEURAL

## 2023-12-29 MED ORDER — BUPIVACAINE IN DEXTROSE 0.75-8.25 % IT SOLN
INTRATHECAL | Status: DC | PRN
  Administered 2023-12-29: 1.6 mL via INTRATHECAL

## 2023-12-29 MED ORDER — HYDROMORPHONE HCL 1 MG/ML IJ SOLN
0.2500 mg | INTRAMUSCULAR | Status: DC | PRN
  Administered 2023-12-29: 0.5 mg via INTRAVENOUS

## 2023-12-29 MED ORDER — LACTATED RINGERS IV BOLUS
250.0000 mL | Freq: Once | INTRAVENOUS | Status: DC
Start: 2023-12-29 — End: 2023-12-29

## 2023-12-29 MED ORDER — SODIUM CHLORIDE 0.9 % IR SOLN
Status: DC | PRN
  Administered 2023-12-29: 1000 mL

## 2023-12-29 MED ORDER — METHOCARBAMOL 1000 MG/10ML IJ SOLN: 500.0000 mg | Freq: Four times a day (QID) | INTRAMUSCULAR | Status: DC | PRN

## 2023-12-29 MED ORDER — OXYCODONE HCL 5 MG PO TABS
10.0000 mg | ORAL_TABLET | ORAL | Status: DC | PRN
  Administered 2023-12-29: 5 mg via ORAL

## 2023-12-29 MED ORDER — HYDROMORPHONE HCL 1 MG/ML IJ SOLN
INTRAMUSCULAR | Status: AC
  Administered 2023-12-29: 0.5 mg via INTRAVENOUS
  Filled 2023-12-29: qty 1

## 2023-12-29 MED ORDER — SODIUM CHLORIDE (PF) 0.9 % IJ SOLN
INTRAMUSCULAR | Status: AC
  Filled 2023-12-29: qty 30

## 2023-12-29 MED ORDER — WATER FOR IRRIGATION, STERILE IR SOLN
Status: DC | PRN
  Administered 2023-12-29: 1000 mL

## 2023-12-29 MED ORDER — PROPOFOL 1000 MG/100ML IV EMUL
INTRAVENOUS | Status: AC
  Filled 2023-12-29: qty 100

## 2023-12-29 MED ORDER — SODIUM CHLORIDE (PF) 0.9 % IJ SOLN: INTRAMUSCULAR | Status: DC | PRN

## 2023-12-29 MED ORDER — PHENYLEPHRINE HCL-NACL 20-0.9 MG/250ML-% IV SOLN
INTRAVENOUS | Status: DC | PRN
  Administered 2023-12-29: 30 ug/min via INTRAVENOUS

## 2023-12-29 MED ORDER — ONDANSETRON HCL 4 MG/2ML IJ SOLN
INTRAMUSCULAR | Status: AC
  Filled 2023-12-29: qty 2

## 2023-12-29 MED ORDER — LACTATED RINGERS IV SOLN
INTRAVENOUS | Status: DC
Start: 2023-12-29 — End: 2023-12-29

## 2023-12-29 MED ORDER — BUPIVACAINE-EPINEPHRINE 0.25% -1:200000 IJ SOLN
INTRAMUSCULAR | Status: AC
  Filled 2023-12-29: qty 1

## 2023-12-29 MED ORDER — ACETAMINOPHEN 500 MG PO TABS
1000.0000 mg | ORAL_TABLET | Freq: Once | ORAL | Status: AC
  Administered 2023-12-29: 1000 mg via ORAL

## 2023-12-29 MED ORDER — OXYCODONE HCL 5 MG PO TABS
ORAL_TABLET | ORAL | Status: AC
  Filled 2023-12-29: qty 1

## 2023-12-29 MED ORDER — ONDANSETRON HCL 4 MG/2ML IJ SOLN
INTRAMUSCULAR | Status: DC | PRN
  Administered 2023-12-29: 4 mg via INTRAVENOUS

## 2023-12-29 MED ORDER — METHOCARBAMOL 500 MG PO TABS
ORAL_TABLET | ORAL | Status: AC
  Administered 2023-12-29: 500 mg via ORAL
  Filled 2023-12-29: qty 1

## 2023-12-29 MED ORDER — ASPIRIN 81 MG PO TBEC
81.0000 mg | DELAYED_RELEASE_TABLET | Freq: Two times a day (BID) | ORAL | 0 refills | Status: AC
  Filled 2023-12-29: qty 60, 30d supply, fill #0

## 2023-12-29 MED ORDER — POVIDONE-IODINE 10 % EX SWAB
2.0000 | Freq: Once | CUTANEOUS | Status: AC
  Administered 2023-12-29: 2 via TOPICAL

## 2023-12-29 MED ORDER — OXYCODONE HCL 5 MG PO TABS
5.0000 mg | ORAL_TABLET | ORAL | 0 refills | Status: DC | PRN
  Filled 2023-12-29: qty 30, 5d supply, fill #0

## 2023-12-29 MED ORDER — LACTATED RINGERS IV BOLUS: 500.0000 mL | Freq: Once | INTRAVENOUS | Status: DC

## 2023-12-29 MED ORDER — FENTANYL CITRATE (PF) 100 MCG/2ML IJ SOLN
INTRAMUSCULAR | Status: AC
  Filled 2023-12-29: qty 2

## 2023-12-29 MED ORDER — HYDROMORPHONE HCL 1 MG/ML IJ SOLN
INTRAMUSCULAR | Status: AC
  Filled 2023-12-29: qty 1

## 2023-12-29 MED ORDER — FENTANYL CITRATE (PF) 100 MCG/2ML IJ SOLN
INTRAMUSCULAR | Status: DC | PRN
  Administered 2023-12-29: 50 ug via INTRAVENOUS
  Administered 2023-12-29: 100 ug via INTRAVENOUS
  Administered 2023-12-29: 50 ug via INTRAVENOUS

## 2023-12-29 MED ORDER — ROPIVACAINE HCL 5 MG/ML IJ SOLN
INTRAMUSCULAR | Status: DC | PRN
  Administered 2023-12-29: 30 mL via PERINEURAL

## 2023-12-29 MED ORDER — DEXAMETHASONE SODIUM PHOSPHATE 10 MG/ML IJ SOLN
INTRAMUSCULAR | Status: AC
Start: 2023-12-29 — End: ?
  Filled 2023-12-29: qty 1

## 2023-12-29 MED ORDER — ORAL CARE MOUTH RINSE: 15.0000 mL | Freq: Once | OROMUCOSAL | Status: AC

## 2023-12-29 MED ORDER — TRANEXAMIC ACID-NACL 1000-0.7 MG/100ML-% IV SOLN
1000.0000 mg | INTRAVENOUS | Status: AC
  Administered 2023-12-29: 1000 mg via INTRAVENOUS
  Filled 2023-12-29: qty 100

## 2023-12-29 MED ORDER — 0.9 % SODIUM CHLORIDE (POUR BTL) OPTIME
TOPICAL | Status: DC | PRN
  Administered 2023-12-29: 1000 mL

## 2023-12-29 MED ORDER — BUPIVACAINE LIPOSOME 1.3 % IJ SUSP
INTRAMUSCULAR | Status: AC
Start: 2023-12-29 — End: ?
  Filled 2023-12-29: qty 20

## 2023-12-29 MED ORDER — CEFAZOLIN SODIUM-DEXTROSE 2-4 GM/100ML-% IV SOLN
2.0000 g | INTRAVENOUS | Status: AC
  Administered 2023-12-29: 2 g via INTRAVENOUS
  Filled 2023-12-29: qty 100

## 2023-12-29 MED ORDER — ACETAMINOPHEN 500 MG PO TABS
1000.0000 mg | ORAL_TABLET | Freq: Once | ORAL | Status: DC
  Filled 2023-12-29: qty 2

## 2023-12-29 MED ORDER — METHOCARBAMOL 750 MG PO TABS
750.0000 mg | ORAL_TABLET | Freq: Three times a day (TID) | ORAL | 0 refills | Status: AC | PRN
  Filled 2023-12-29: qty 20, 7d supply, fill #0

## 2023-12-29 MED ORDER — ACETAMINOPHEN 500 MG PO TABS
1000.0000 mg | ORAL_TABLET | Freq: Four times a day (QID) | ORAL | 0 refills | Status: AC | PRN
  Filled 2023-12-29: qty 60, 8d supply, fill #0

## 2023-12-29 MED ORDER — DOCUSATE SODIUM 100 MG PO CAPS
100.0000 mg | ORAL_CAPSULE | Freq: Two times a day (BID) | ORAL | 0 refills | Status: AC | PRN
  Filled 2023-12-29: qty 20, 10d supply, fill #0

## 2023-12-29 SURGICAL SUPPLY — 45 items
BAG COUNTER SPONGE SURGICOUNT (BAG) IMPLANT
BLADE SAG 18X100X1.27 (BLADE) ×1 IMPLANT
BLADE SAGITTAL 25.0X1.37X90 (BLADE) ×1 IMPLANT
BLADE SURG 15 STRL LF DISP TIS (BLADE) ×1 IMPLANT
BNDG ELASTIC 6X10 VLCR STRL LF (GAUZE/BANDAGES/DRESSINGS) ×1 IMPLANT
BOWL SMART MIX CTS (DISPOSABLE) IMPLANT
CLSR STERI-STRIP ANTIMIC 1/2X4 (GAUZE/BANDAGES/DRESSINGS) ×2 IMPLANT
COMP FEM SZ5 CRUC LEFT RETAIN (Orthopedic Implant) ×1 IMPLANT
COMPONENT FEM SZ5 CRU LT RETN (Orthopedic Implant) IMPLANT
COVER SURGICAL LIGHT HANDLE (MISCELLANEOUS) ×1 IMPLANT
CUFF TRNQT CYL 34X4.125X (TOURNIQUET CUFF) ×1 IMPLANT
DRAPE U-SHAPE 47X51 STRL (DRAPES) ×1 IMPLANT
DRSG MEPILEX POST OP 4X12 (GAUZE/BANDAGES/DRESSINGS) ×1 IMPLANT
DURAPREP 26ML APPLICATOR (WOUND CARE) ×2 IMPLANT
ELECT REM PT RETURN 15FT ADLT (MISCELLANEOUS) ×1 IMPLANT
GLOVE BIO SURGEON STRL SZ7.5 (GLOVE) ×2 IMPLANT
GLOVE BIOGEL PI IND STRL 7.5 (GLOVE) ×1 IMPLANT
GLOVE BIOGEL PI IND STRL 8 (GLOVE) ×1 IMPLANT
GLOVE SURG SYN 7.0 (GLOVE) ×1
GLOVE SURG SYN 7.0 PF PI (GLOVE) ×1 IMPLANT
GOWN STRL REUS W/ TWL LRG LVL3 (GOWN DISPOSABLE) ×1 IMPLANT
GOWN STRL REUS W/ TWL XL LVL3 (GOWN DISPOSABLE) ×1 IMPLANT
HOLDER FOLEY CATH W/STRAP (MISCELLANEOUS) IMPLANT
IMMOBILIZER KNEE 20 (SOFTGOODS) ×1
IMMOBILIZER KNEE 20 THIGH 36 (SOFTGOODS) IMPLANT
IMMOBILIZER KNEE 22 UNIV (SOFTGOODS) IMPLANT
INSERT TIB BEARING X3 SZ4 11 (Joint) IMPLANT
KIT TURNOVER KIT A (KITS) IMPLANT
KNEE PATELLA ASYMMETRIC 10X32 (Knees) IMPLANT
KNEE TIBIAL COMP TRI SZ4 (Knees) IMPLANT
MANIFOLD NEPTUNE II (INSTRUMENTS) ×1 IMPLANT
NS IRRIG 1000ML POUR BTL (IV SOLUTION) ×1 IMPLANT
PACK TOTAL KNEE CUSTOM (KITS) ×1 IMPLANT
PIN FLUTED HEDLESS FIX 3.5X1/8 (PIN) IMPLANT
PROTECTOR NERVE ULNAR (MISCELLANEOUS) ×1 IMPLANT
SET HNDPC FAN SPRY TIP SCT (DISPOSABLE) ×1 IMPLANT
SPIKE FLUID TRANSFER (MISCELLANEOUS) ×1 IMPLANT
SUT MNCRL AB 3-0 PS2 18 (SUTURE) ×1 IMPLANT
SUT VIC AB 0 CT1 36 (SUTURE) ×1 IMPLANT
SUT VIC AB 1 CT1 36 (SUTURE) ×1 IMPLANT
SUT VIC AB 2-0 CT1 TAPERPNT 27 (SUTURE) ×1 IMPLANT
TOWEL GREEN STERILE FF (TOWEL DISPOSABLE) ×1 IMPLANT
TRAY FOLEY MTR SLVR 16FR STAT (SET/KITS/TRAYS/PACK) IMPLANT
TUBE SUCTION HIGH CAP CLEAR NV (SUCTIONS) ×1 IMPLANT
WRAP KNEE MAXI GEL POST OP (GAUZE/BANDAGES/DRESSINGS) ×1 IMPLANT

## 2023-12-29 NOTE — Anesthesia Procedure Notes (Signed)
Anesthesia Regional Block: Adductor canal block   Pre-Anesthetic Checklist: , timeout performed,  Correct Patient, Correct Site, Correct Laterality,  Correct Procedure, Correct Position, site marked,  Risks and benefits discussed,  Surgical consent,  Pre-op evaluation,  At surgeon's request and post-op pain management  Laterality: Lower and Left  Prep: chloraprep       Needles:  Injection technique: Single-shot  Needle Type: Stimiplex     Needle Length: 9cm  Needle Gauge: 21     Additional Needles:   Procedures:,,,, ultrasound used (permanent image in chart),,    Narrative:  Start time: 12/29/2023 6:51 AM End time: 12/29/2023 7:11 AM Injection made incrementally with aspirations every 5 mL.  Performed by: Personally  Anesthesiologist: Lewie Loron, MD  Additional Notes: BP cuff, EKG monitors applied. Sedation begun. Artery and nerve location verified with ultrasound. Anesthetic injected incrementally (5ml), slowly, and after negative aspirations under direct u/s guidance. Good fascial/perineural spread. Tolerated well.

## 2023-12-29 NOTE — Progress Notes (Signed)
 Orthopedic Tech Progress Note Patient Details:  [name] [name] 1961/11/24 [id number]  Ortho Devices Type of Ortho Device: Bone foam zero knee Ortho Device/Splint Location: left Ortho Device/Splint Interventions: Ordered, Application, Adjustment   Post Interventions Patient Tolerated: Well Instructions Provided: Adjustment of device, Care of device  [name] [name] 12/29/2023, 9:57 AM

## 2023-12-29 NOTE — Anesthesia Procedure Notes (Signed)
 Procedure Name: LMA Insertion Date/Time: 12/29/2023 7:50 AM  Performed by: Judythe Tanda Aran, CRNAPre-anesthesia Checklist: Emergency Drugs available, Patient identified, Suction available and Patient being monitored Patient Re-evaluated:Patient Re-evaluated prior to induction Oxygen  Delivery Method: Circle system utilized Preoxygenation: Pre-oxygenation with 100% oxygen  Induction Type: IV induction Ventilation: Mask ventilation without difficulty LMA: LMA inserted LMA Size: 4.0 Number of attempts: 1 Placement Confirmation: positive ETCO2 and breath sounds checked- equal and bilateral Tube secured with: Tape Dental Injury: Teeth and Oropharynx as per pre-operative assessment

## 2023-12-29 NOTE — Op Note (Signed)
 DATE OF SURGERY:  12/29/2023 TIME: 8:41 AM  PATIENT NAME:  Leverne Moscita Gwenn Barrio   AGE: 63 y.o.    PRE-OPERATIVE DIAGNOSIS:  OA LEFT KNEE  POST-OPERATIVE DIAGNOSIS:  Same  PROCEDURE:  Procedure(s): LEFT TOTAL KNEE ARTHROPLASTY   SURGEON:  [name], MD   ASSISTANT:  Gerard Large, PA-C, she was present and scrubbed throughout the case, critical for completion in a timely fashion, and for retraction, instrumentation, and closure.    OPERATIVE IMPLANTS: St[employer]ker Triathlon CR. Press fit knee  Femur size 5, Tibia size 4, Patella size 32 3-peg oval button, with a 11 mm polyethylene insert.   PREOPERATIVE INDICATIONS:  Latrisha Moscita Magon Croson is a 63 y.o. year old female with end stage bone on bone degenerative arthritis of the knee who failed conservative treatment, including injections, antiinflammatories, activity modification, and assistive devices, and had significant impairment of their activities of daily living, and elected for Total Knee Arthroplasty.   The risks, benefits, and alternatives were discussed at length including but not limited to the risks of infection, bleeding, nerve injury, stiffness, blood clots, the need for revision surgery, cardiopulmonary complications, among others, and they were willing to proceed.   OPERATIVE DESCRIPTION:  The patient was brought to the operative room and placed in a supine position.  General anesthesia was administered.  IV antibiotics were given.  The lower extremity was prepped and draped in the usual sterile fashion.  Time out was performed.  The leg was elevated and exsanguinated and the tourniquet was inflated.  Anterior approach was performed.  The patella was everted and osteophytes were removed.  The anterior horn of the medial and lateral meniscus was removed.   The distal femur was opened with the drill and the intramedullary distal femoral cutting jig was utilized, set at 5 degrees resecting 8 mm off the  distal femur.  Care was taken to protect the collateral ligaments.  The distal femoral sizing jig was applied, taking care to avoid notching.  Then the 4-in-1 cutting jig was applied and the anterior and posterior femur was cut, along with the chamfer cuts.  All posterior osteophytes were removed.  The flexion gap was then measured and was symmetric with the extension gap.  Then the extramedullary tibial cutting jig was utilized making the appropriate cut using the anterior tibial crest as a reference building in appropriate posterior slope.  Care was taken during the cut to protect the medial and collateral ligaments.  The proximal tibia was removed along with the posterior horns of the menisci.    I completed the distal femoral preparation using the appropriate jig to prepare the box.  The patella was then measured, and cut with the saw.    The proximal tibia sized and prepared accordingly with the reamer and the punch, and then all components were trialed with the above sized poly insert.  The knee was found to have excellent balance and full motion.    The above named components were then impacted into place and Poly tibial piece and patella were inserted.  I was very happy with his stability and ROM  I performed a periarticular injection with Exparel   The knee was easily taken through a range of motion and the patella tracked well and the knee irrigated copiously and the parapatellar and subcutaneous tissue closed with vicryl, and monocryl with steri strips for the skin.  The incision was dressed with sterile gauze and the tourniquet released and the patient was awakened and  returned to the PACU in stable and satisfactory condition.  There were no complications.  Total tourniquet time was roughly 60 minutes.   POSTOPERATIVE PLAN: post op Abx, DVT px: SCD's, TED's, Early ambulation and chemical px

## 2023-12-29 NOTE — Transfer of Care (Signed)
 Immediate Anesthesia Transfer of Care Note  Patient: [name] [name]  Procedure(s) Performed: LEFT TOTAL KNEE ARTHROPLASTY (Left: Knee)  Patient Location: PACU  Anesthesia Type:GA combined with regional for post-op pain  Level of Consciousness: awake  Airway & Oxygen  Therapy: Patient Spontanous Breathing and Patient connected to face mask  Post-op Assessment: Report given to RN and Post -op Vital signs reviewed and stable  Post vital signs: Reviewed and stable  Last Vitals:  Vitals Value Taken Time  BP 91/67 12/29/23 0922  Temp    Pulse 82 12/29/23 0923  Resp 15 12/29/23 0923  SpO2 99 % 12/29/23 0923  Vitals shown include unfiled device data.  Last Pain:  Vitals:   12/29/23 0620  TempSrc: Oral  PainSc:          Complications: No notable events documented.

## 2023-12-29 NOTE — Anesthesia Postprocedure Evaluation (Signed)
 Anesthesia Post Note  Patient: [name] [name]  Procedure(s) Performed: LEFT TOTAL KNEE ARTHROPLASTY (Left: Knee)     Patient location during evaluation: PACU Anesthesia Type: General Level of consciousness: sedated and patient cooperative Pain management: pain level controlled Vital Signs Assessment: post-procedure vital signs reviewed and stable Respiratory status: spontaneous breathing Cardiovascular status: stable Anesthetic complications: no   No notable events documented.  Last Vitals:  Vitals:   12/29/23 1030 12/29/23 1045  BP: 99/73 106/69  Pulse: 68 64  Resp: 14 14  Temp:    SpO2: 95% 94%    Last Pain:  Vitals:   12/29/23 1030  TempSrc:   PainSc: 5                  Norleen Pope

## 2023-12-29 NOTE — Discharge Instructions (Signed)

## 2023-12-29 NOTE — Interval H&P Note (Signed)
 History and Physical Interval Note:  12/29/2023 6:31 AM  [name] [name]  has presented today for surgery, with the diagnosis of OA LEFT KNEE.  The various methods of treatment have been discussed with the patient and family. After consideration of risks, benefits and other options for treatment, the patient has consented to  Procedure(s): TOTAL KNEE ARTHROPLASTY (Left) as a surgical intervention.  The patient's history has been reviewed, patient examined, no change in status, stable for surgery.  I have reviewed the patient's chart and labs.  Questions were answered to the patient's satisfaction.     [name] [name]

## 2023-12-29 NOTE — Anesthesia Procedure Notes (Signed)
 Procedure Name: MAC Date/Time: 12/29/2023 7:28 AM  Performed by: Judythe Tanda Aran, CRNAPre-anesthesia Checklist: Patient identified, Emergency Drugs available, Suction available and Patient being monitored Patient Re-evaluated:Patient Re-evaluated prior to induction Oxygen  Delivery Method: Simple face mask

## 2023-12-29 NOTE — Anesthesia Procedure Notes (Addendum)
 Spinal  Patient location during procedure: OR Start time: 12/29/2023 7:23 AM End time: 12/29/2023 7:28 AM Reason for block: surgical anesthesia Staffing Performed: anesthesiologist  Anesthesiologist: Darlyn Rush, MD Performed by: Darlyn Rush, MD Authorized by: Darlyn Rush, MD   Preanesthetic Checklist Completed: patient identified, IV checked, site marked, risks and benefits discussed, surgical consent, monitors and equipment checked, pre-op evaluation and timeout performed Spinal Block Patient position: sitting Prep: DuraPrep and site prepped and draped Patient monitoring: heart rate, continuous pulse ox and blood pressure Approach: right paramedian Location: L3-4 Injection technique: single-shot Needle Needle type: Spinocan  Needle gauge: 25 G Needle length: 9 cm Additional Notes Expiration date of kit checked and confirmed. Patient tolerated procedure well, without complications.

## 2023-12-29 NOTE — Evaluation (Signed)
 Physical Therapy Evaluation Patient Details Name: [name] [name] MRN: [id number] DOB: Mar 17, 1961 Today's Date: 12/29/2023  History of Present Illness  63 yo female presents to therapy s/p L TKA on 12/29/2023 due to failure of conservative measures. Pt PMH includes but is not limited to: asthma, CHD, HTN, lumbar herniated disc, OA, RA, OSA on CPAP, SOB amd gastric sleeve.  Clinical Impression   [name] [name] is a 63 y.o. female POD 0 s/p L TKA. Patient reports IND with mobility at baseline. Patient is now limited by functional impairments (see PT problem list below) and requires CGA and cues for transfers and gait with RW. Patient was able to ambulate 40 feet with RW and CGA and cues for safe walker management. Patient educated on safe sequencing for stair mobility, fall risk prevention, pain management and goal, use of CP/ice, recommendations for CMP and car transfers pt and son verbalized safe guarding position for people assisting with mobility. Patient instructed in exercises to facilitate ROM and circulation reviewed and HO provided. Patient will benefit from continued skilled PT interventions to address impairments and progress towards PLOF. Patient has met mobility goals at adequate level for discharge home with family support and OPPT services scheduled for 2/6; will continue to follow if pt continues acute stay to progress towards Mod I goals.       If plan is discharge home, recommend the following: A little help with walking and/or transfers;A little help with bathing/dressing/bathroom;Assistance with cooking/housework;Assist for transportation;Help with stairs or ramp for entrance   Can travel by private vehicle        Equipment Recommendations None recommended by PT  Recommendations for Other Services       Functional Status Assessment Patient has had a recent decline in their functional status and demonstrates the ability to make significant  improvements in function in a reasonable and predictable amount of time.     Precautions / Restrictions Precautions Precautions: Knee;Fall Restrictions Weight Bearing Restrictions Per Provider Order: No      Mobility  Bed Mobility Overal bed mobility: Needs Assistance Bed Mobility: Supine to Sit     Supine to sit: Supervision, HOB elevated     General bed mobility comments: min cues and increased time for L LE to EOB    Transfers Overall transfer level: Needs assistance Equipment used: Rolling walker (2 wheels) Transfers: Sit to/from Stand Sit to Stand: Contact guard assist           General transfer comment: min cues    Ambulation/Gait Ambulation/Gait assistance: Contact guard assist Gait Distance (Feet): 40 Feet Assistive device: Rolling walker (2 wheels) Gait Pattern/deviations: Step-to pattern, Antalgic, Trunk flexed Gait velocity: decreased     General Gait Details: slight trunk flexion and B UE support at RW to offload L LE in stance phase, cues for RW management and safety  Stairs Stairs: Yes Stairs assistance: Contact guard assist Stair Management: Two rails Number of Stairs: 2 General stair comments: min cues for safety and sequencing for step navigation, pt encouraged to increase L knee flexion to clear step  Wheelchair Mobility     Tilt Bed    Modified Rankin (Stroke Patients Only)       Balance Overall balance assessment: Needs assistance Sitting-balance support: Feet supported Sitting balance-Leahy Scale: Good     Standing balance support: Bilateral upper extremity supported, During functional activity, Reliant on assistive device for balance Standing balance-Leahy Scale: Poor  Pertinent Vitals/Pain Pain Assessment Pain Assessment: 0-10 Pain Score: 4  Pain Location: L knee Pain Descriptors / Indicators: Aching, Constant, Discomfort, Dull, Grimacing, Operative site guarding Pain  Intervention(s): Limited activity within patient's tolerance, Monitored during session, Premedicated before session, Repositioned, Ice applied    Home Living Family/patient expects to be discharged to:: Private residence Living Arrangements: Spouse/significant other Available Help at Discharge: Family Type of Home: House Home Access: Stairs to enter Entrance Stairs-Rails: Right;Left;Can reach both Entrance Stairs-Number of Steps: 4   Home Layout: One level Home Equipment: Agricultural Consultant (2 wheels);Cane - single point      Prior Function Prior Level of Function : Independent/Modified Independent;Driving             Mobility Comments: mod I with use of SPC for all ADLs, self care tasks and IADLs       Extremity/Trunk Assessment        Lower Extremity Assessment Lower Extremity Assessment: LLE deficits/detail LLE Deficits / Details: ankle DF/PF 5/5; SLR < 10 degree lag LLE Sensation: WNL    Cervical / Trunk Assessment Cervical / Trunk Assessment: Normal  Communication   Communication Communication: No apparent difficulties  Cognition Arousal: Alert Behavior During Therapy: WFL for tasks assessed/performed Overall Cognitive Status: Within Functional Limits for tasks assessed                                          General Comments      Exercises Total Joint Exercises Ankle Circles/Pumps: AROM, Both, 10 reps Quad Sets: AROM, Left, 5 reps Short Arc Quad: AROM, Left, 5 reps Heel Slides: AROM, Left, 5 reps Hip ABduction/ADduction: AROM, Left, 5 reps Straight Leg Raises: AROM, Left, 5 reps Knee Flexion: AROM, Left, 5 reps, Seated   Assessment/Plan    PT Assessment Patient needs continued PT services  PT Problem List Decreased strength;Decreased range of motion;Decreased activity tolerance;Decreased balance;Decreased mobility;Decreased coordination;Pain       PT Treatment Interventions DME instruction;Gait training;Stair training;Functional  mobility training;Therapeutic activities;Therapeutic exercise;Balance training;Neuromuscular re-education;Patient/family education;Modalities    PT Goals (Current goals can be found in the Care Plan section)  Acute Rehab PT Goals Patient Stated Goal: to be able to walk more normally no AD and no pain PT Goal Formulation: With patient Time For Goal Achievement: 01/12/24 Potential to Achieve Goals: Good    Frequency 7X/week     Co-evaluation               AM-PAC PT 6 Clicks Mobility  Outcome Measure Help needed turning from your back to your side while in a flat bed without using bedrails?: None Help needed moving from lying on your back to sitting on the side of a flat bed without using bedrails?: A Little Help needed moving to and from a bed to a chair (including a wheelchair)?: A Little Help needed standing up from a chair using your arms (e.g., wheelchair or bedside chair)?: A Little Help needed to walk in hospital room?: A Little Help needed climbing 3-5 steps with a railing? : A Little 6 Click Score: 19    End of Session Equipment Utilized During Treatment: Gait belt Activity Tolerance: Patient tolerated treatment well;No increased pain Patient left: in chair;with call bell/phone within reach;with family/visitor present Nurse Communication: Mobility status;Other (comment) (pt readiness for d/c from PT standpoint) PT Visit Diagnosis: Unsteadiness on feet (R26.81);Other abnormalities of gait and mobility (R26.89);Muscle  weakness (generalized) (M62.81);Pain;Difficulty in walking, not elsewhere classified (R26.2) Pain - Right/Left: Left Pain - part of body: Knee;Leg    Time: 1207-1256 PT Time Calculation (min) (ACUTE ONLY): 49 min   Charges:   PT Evaluation $PT Eval Low Complexity: 1 Low PT Treatments $Gait Training: 8-22 mins $Therapeutic Exercise: 8-22 mins PT General Charges $$ ACUTE PT VISIT: 1 Visit         [place], PT Acute Rehab   [place] VEAR Drone 12/29/2023, 3:26 PM

## 2023-12-30 ENCOUNTER — Encounter (HOSPITAL_COMMUNITY): Payer: Self-pay | Admitting: Orthopedic Surgery

## 2023-12-31 DIAGNOSIS — M1712 Unilateral primary osteoarthritis, left knee: Secondary | ICD-10-CM | POA: Diagnosis not present

## 2024-01-05 DIAGNOSIS — M1712 Unilateral primary osteoarthritis, left knee: Secondary | ICD-10-CM | POA: Diagnosis not present

## 2024-01-07 DIAGNOSIS — M1712 Unilateral primary osteoarthritis, left knee: Secondary | ICD-10-CM | POA: Diagnosis not present

## 2024-01-13 DIAGNOSIS — M1712 Unilateral primary osteoarthritis, left knee: Secondary | ICD-10-CM | POA: Diagnosis not present

## 2024-01-15 DIAGNOSIS — M1712 Unilateral primary osteoarthritis, left knee: Secondary | ICD-10-CM | POA: Diagnosis not present

## 2024-01-29 ENCOUNTER — Other Ambulatory Visit: Payer: Self-pay

## 2024-01-29 ENCOUNTER — Other Ambulatory Visit (HOSPITAL_COMMUNITY): Payer: Self-pay

## 2024-01-29 MED ORDER — ZEPBOUND 7.5 MG/0.5ML ~~LOC~~ SOAJ
7.5000 mg | SUBCUTANEOUS | 0 refills | Status: AC
Start: 2024-01-28 — End: ?
  Filled 2024-01-29: qty 2, 28d supply, fill #0

## 2024-02-01 DIAGNOSIS — M1712 Unilateral primary osteoarthritis, left knee: Secondary | ICD-10-CM | POA: Diagnosis not present

## 2024-02-04 DIAGNOSIS — M1712 Unilateral primary osteoarthritis, left knee: Secondary | ICD-10-CM | POA: Diagnosis not present

## 2024-02-10 DIAGNOSIS — M1712 Unilateral primary osteoarthritis, left knee: Secondary | ICD-10-CM | POA: Diagnosis not present

## 2024-02-15 DIAGNOSIS — M1712 Unilateral primary osteoarthritis, left knee: Secondary | ICD-10-CM | POA: Diagnosis not present

## 2024-02-17 DIAGNOSIS — M1712 Unilateral primary osteoarthritis, left knee: Secondary | ICD-10-CM | POA: Diagnosis not present

## 2024-02-22 DIAGNOSIS — M1712 Unilateral primary osteoarthritis, left knee: Secondary | ICD-10-CM | POA: Diagnosis not present

## 2024-02-23 ENCOUNTER — Other Ambulatory Visit (HOSPITAL_COMMUNITY): Payer: Self-pay

## 2024-02-23 ENCOUNTER — Other Ambulatory Visit: Payer: Self-pay

## 2024-02-23 DIAGNOSIS — E785 Hyperlipidemia, unspecified: Secondary | ICD-10-CM | POA: Diagnosis not present

## 2024-02-23 DIAGNOSIS — R6889 Other general symptoms and signs: Secondary | ICD-10-CM | POA: Diagnosis not present

## 2024-02-23 DIAGNOSIS — R7303 Prediabetes: Secondary | ICD-10-CM | POA: Diagnosis not present

## 2024-02-23 DIAGNOSIS — I5022 Chronic systolic (congestive) heart failure: Secondary | ICD-10-CM | POA: Diagnosis not present

## 2024-02-23 DIAGNOSIS — I10 Essential (primary) hypertension: Secondary | ICD-10-CM | POA: Diagnosis not present

## 2024-02-23 MED ORDER — ZEPBOUND 5 MG/0.5ML ~~LOC~~ SOAJ
5.0000 mg | SUBCUTANEOUS | 3 refills | Status: AC
  Filled 2024-02-23 – 2024-03-28 (×2): qty 2, 28d supply, fill #0
  Filled 2024-05-17: qty 2, 28d supply, fill #1
  Filled 2024-06-10: qty 2, 28d supply, fill #2
  Filled 2024-07-08: qty 2, 28d supply, fill #3
  Filled 2024-08-05: qty 2, 28d supply, fill #4
  Filled 2024-08-29: qty 2, 28d supply, fill #5
  Filled 2024-09-20: qty 2, 28d supply, fill #6
  Filled 2024-10-14: qty 2, 28d supply, fill #7
  Filled 2024-11-05: qty 2, 28d supply, fill #8

## 2024-02-26 DIAGNOSIS — M1712 Unilateral primary osteoarthritis, left knee: Secondary | ICD-10-CM | POA: Diagnosis not present

## 2024-02-29 DIAGNOSIS — M1712 Unilateral primary osteoarthritis, left knee: Secondary | ICD-10-CM | POA: Diagnosis not present

## 2024-03-03 DIAGNOSIS — M1712 Unilateral primary osteoarthritis, left knee: Secondary | ICD-10-CM | POA: Diagnosis not present

## 2024-03-09 DIAGNOSIS — M1712 Unilateral primary osteoarthritis, left knee: Secondary | ICD-10-CM | POA: Diagnosis not present

## 2024-03-28 ENCOUNTER — Other Ambulatory Visit (HOSPITAL_COMMUNITY): Payer: Self-pay

## 2024-04-06 DIAGNOSIS — M1712 Unilateral primary osteoarthritis, left knee: Secondary | ICD-10-CM | POA: Diagnosis not present

## 2024-04-12 ENCOUNTER — Other Ambulatory Visit (HOSPITAL_COMMUNITY): Payer: Self-pay

## 2024-04-20 ENCOUNTER — Other Ambulatory Visit (HOSPITAL_COMMUNITY): Payer: Self-pay

## 2024-04-21 ENCOUNTER — Other Ambulatory Visit: Payer: Self-pay | Admitting: Family Medicine

## 2024-04-21 DIAGNOSIS — Z1231 Encounter for screening mammogram for malignant neoplasm of breast: Secondary | ICD-10-CM

## 2024-04-22 ENCOUNTER — Encounter (HOSPITAL_COMMUNITY): Payer: Self-pay | Admitting: *Deleted

## 2024-04-23 DIAGNOSIS — M069 Rheumatoid arthritis, unspecified: Secondary | ICD-10-CM | POA: Diagnosis not present

## 2024-04-23 DIAGNOSIS — M1990 Unspecified osteoarthritis, unspecified site: Secondary | ICD-10-CM | POA: Diagnosis not present

## 2024-04-23 DIAGNOSIS — E785 Hyperlipidemia, unspecified: Secondary | ICD-10-CM | POA: Diagnosis not present

## 2024-04-23 DIAGNOSIS — I5022 Chronic systolic (congestive) heart failure: Secondary | ICD-10-CM | POA: Diagnosis not present

## 2024-05-13 ENCOUNTER — Other Ambulatory Visit (HOSPITAL_COMMUNITY): Payer: Self-pay

## 2024-05-23 DIAGNOSIS — M1990 Unspecified osteoarthritis, unspecified site: Secondary | ICD-10-CM | POA: Diagnosis not present

## 2024-05-23 DIAGNOSIS — I5022 Chronic systolic (congestive) heart failure: Secondary | ICD-10-CM | POA: Diagnosis not present

## 2024-05-23 DIAGNOSIS — E785 Hyperlipidemia, unspecified: Secondary | ICD-10-CM | POA: Diagnosis not present

## 2024-05-23 DIAGNOSIS — M069 Rheumatoid arthritis, unspecified: Secondary | ICD-10-CM | POA: Diagnosis not present

## 2024-05-30 ENCOUNTER — Other Ambulatory Visit (HOSPITAL_COMMUNITY): Payer: Self-pay

## 2024-05-30 DIAGNOSIS — Z23 Encounter for immunization: Secondary | ICD-10-CM | POA: Diagnosis not present

## 2024-05-30 DIAGNOSIS — M25569 Pain in unspecified knee: Secondary | ICD-10-CM | POA: Diagnosis not present

## 2024-05-30 DIAGNOSIS — E785 Hyperlipidemia, unspecified: Secondary | ICD-10-CM | POA: Diagnosis not present

## 2024-05-30 DIAGNOSIS — I10 Essential (primary) hypertension: Secondary | ICD-10-CM | POA: Diagnosis not present

## 2024-05-30 DIAGNOSIS — Z713 Dietary counseling and surveillance: Secondary | ICD-10-CM | POA: Diagnosis not present

## 2024-05-30 MED ORDER — ZEPBOUND 10 MG/0.5ML ~~LOC~~ SOAJ
10.0000 mg | SUBCUTANEOUS | 1 refills | Status: AC
  Filled 2024-05-30: qty 6, 84d supply, fill #0
  Filled 2024-06-06: qty 2, 28d supply, fill #0
  Filled 2024-07-05: qty 2, 28d supply, fill #1
  Filled 2024-07-28: qty 2, 28d supply, fill #2
  Filled 2024-08-22: qty 2, 28d supply, fill #3
  Filled 2024-09-13: qty 2, 28d supply, fill #4
  Filled 2024-10-04: qty 2, 28d supply, fill #5

## 2024-06-01 ENCOUNTER — Ambulatory Visit: Payer: Self-pay | Admitting: Cardiology

## 2024-06-01 DIAGNOSIS — M170 Bilateral primary osteoarthritis of knee: Secondary | ICD-10-CM | POA: Diagnosis not present

## 2024-06-02 ENCOUNTER — Telehealth: Payer: Self-pay | Admitting: *Deleted

## 2024-06-02 NOTE — Telephone Encounter (Signed)
   Name: [facility] [name] [name]  DOB: 05-21-1961  MRN: [id number]  Primary Cardiologist: Lynwood Schilling, MD  Chart reviewed as part of pre-operative protocol coverage. The patient has an upcoming visit scheduled with Dr. Schilling on 06/10/24 at which time clearance can be addressed in case there are any issues that would impact surgical recommendations.  Right total knee arthroplasty is not scheduled as below. I added preop FYI to appointment note so that provider is aware to address at time of outpatient visit.  Per office protocol the cardiology provider should forward their finalized clearance decision and recommendations regarding antiplatelet therapy to the requesting party below.    I will route this message as FYI to requesting party and remove this message from the preop box as separate preop APP input not needed at this time.   Please call with any questions.  Jakaiden Fill D Gorge Almanza, NP  06/02/2024, 12:15 PM

## 2024-06-02 NOTE — Telephone Encounter (Signed)
   Pre-operative Risk Assessment    Patient Name: [name] [name]  DOB: 07/05/1961 MRN: [id number]   Date of last office visit: 09/29/23  HAO MENG, PAC Date of next office visit: 06/10/24 DR. HOCHRIEN   Request for Surgical Clearance    Procedure:  RIGHT TOTAL KNEE ARTHROPLASTY  Date of Surgery:  Clearance TBD                                Surgeon:  DR. EVALENE CHANCY Surgeon's Group or Practice Name:  [facility] Phone number:  [phone] [address]T [address] Fax number:  [phone]   Type of Clearance Requested:   - Medical  - Pharmacy:  Hold Aspirin      Type of Anesthesia:  Spinal   Additional requests/questions:    [name]   06/02/2024, 10:31 AM

## 2024-06-06 ENCOUNTER — Other Ambulatory Visit (HOSPITAL_COMMUNITY): Payer: Self-pay

## 2024-06-07 ENCOUNTER — Ambulatory Visit
Admission: RE | Admit: 2024-06-07 | Discharge: 2024-06-07 | Disposition: A | Discharge status: Home / Self Care | Source: Ambulatory Visit | Attending: Family Medicine | Admitting: Family Medicine

## 2024-06-07 DIAGNOSIS — Z1231 Encounter for screening mammogram for malignant neoplasm of breast: Secondary | ICD-10-CM | POA: Diagnosis not present

## 2024-06-09 DIAGNOSIS — E785 Hyperlipidemia, unspecified: Secondary | ICD-10-CM | POA: Insufficient documentation

## 2024-06-09 NOTE — Progress Notes (Unsigned)
 Cardiology Office Note:   Date:  06/10/2024  ID:  [address] [name] [name], DOB 03-13-1961, MRN [id number] PCP: Dyane Anthony RAMAN, FNP  [facility] Providers Cardiologist:  Lynwood Schilling, MD {  History of Present Illness:   [name] [name] is a 63 y.o. female PMH of LBBB, cardiomyopathy, OSA, hypertension, and prediabetes.  Echocardiogram in 2013 showed normal EF was mild MR.  Sleep study in 2017 showed moderate obstructive sleep apnea with severe oxygen  desaturation to a nadir of 62%.  She was previously on CPAP therapy, and quit in 2020.  She was admitted in September 2018 with acute dyspnea.  Echocardiogram at the time demonstrated EF of 40%.  She was being treated with Simponi  Aria at the time.  Left and right heart cath was recommended, however due to cost, she chose not to have it.  Follow-up echocardiogram did show that EF improved to 55 to 60%.  She was last seen by Dr. Schilling in December 2019 for atypical chest pain that was felt to be more musculoskeletal in nature.  No further workup was recommended.  More recently, patient was established with Dr. Burnard on 09/14/2023 for preoperative cardiac clearance prior to left knee replacement surgery.  She has had hypotension necessitating decreased in her BP meds with discontinuation of ARB.   Echocardiogram completed on 09/14/2023 shows her EF 45 to 50%, hypokinesis of the LV basal mid septal wall, dyskinesis of the LV entire inferior wall, trivial MR, dilated aorta measuring at 38 mm.  She did have a CT coronary in Dec 2024 and she had zero calcium  and no plaque.    She presents for follow up.  She is going to have right knee replacement.  She had left knee replacement last year and that went well.  Since she had her coronary CTA she has had no new problems. The patient denies any new symptoms such as chest discomfort, neck or arm discomfort. There has been no new shortness of breath, PND or orthopnea. There have been no  reported palpitations, presyncope or syncope.  She does not walk with a cane.  She is lost 90 pounds intentionally.  She feels better since doing this.  ROS: As stated in the HPI and negative for all other systems.  Studies Reviewed:    EKG:   EKG Interpretation Date/Time:  Friday June 10 2024 91:73:77 EDT Ventricular Rate:  84 PR Interval:  178 QRS Duration:  124 QT Interval:  390 QTC Calculation: 460 R Axis:   -31  Text Interpretation: Normal sinus rhythm Left axis deviation Left bundle branch block When compared with ECG of 14-Sep-2023 10:42, No significant change since last tracing Confirmed by Schilling Lynwood ([id number]) on 06/10/2024 8:36:41 AM    Risk Assessment/Calculations:              Physical Exam:   VS:  BP 100/74   Pulse 84   Ht 5' 3 (1.6 m)   Wt 172 lb (78 kg)   SpO2 97%   BMI 30.47 kg/m    Wt Readings from Last 3 Encounters:  06/10/24 172 lb (78 kg)  12/29/23 190 lb (86.2 kg)  12/17/23 190 lb (86.2 kg)     GEN: Well nourished, well developed in no acute distress NECK: No JVD; No carotid bruits CARDIAC: RRR, no murmurs, rubs, gallops RESPIRATORY:  Clear to auscultation without rales, wheezing or rhonchi  ABDOMEN: Soft, non-tender, non-distended EXTREMITIES:  No edema; No deformity   ASSESSMENT AND PLAN:  Cardiomyopathy with Left Bundle Branch Block (LBBB): This has been chronic and with no clear etiology.  She has not tolerated med titration with her low blood pressure but will remain on the meds as listed.  She seems to be euvolemic.  No change in therapy.  I will follow-up with an echocardiogram in 1 year and see her after that.   Hypertension:    This is being managed in the context of managing her cardiomyopathy.  LBBB: This is chronic.  No change in therapy.  She has no symptoms related to bradycardia arrhythmia.  Preop: The patient has a reasonable functional level.  She has no coronary artery disease.  There are no high risk findings.  Though  she has a reduced ejection fraction she does not have decompensated heart failure.  Based on ACC/AHA guidelines the patient is at acceptable risk for the planned procedure without further testing.     Follow up with me after the echocardiogram next year.  Signed, Lynwood Schilling, MD

## 2024-06-10 ENCOUNTER — Ambulatory Visit: Attending: Cardiology | Admitting: Cardiology

## 2024-06-10 ENCOUNTER — Encounter: Payer: Self-pay | Admitting: Cardiology

## 2024-06-10 VITALS — BP 100/74 | HR 84 | Ht 63.0 in | Wt 172.0 lb

## 2024-06-10 DIAGNOSIS — I10 Essential (primary) hypertension: Secondary | ICD-10-CM

## 2024-06-10 DIAGNOSIS — E785 Hyperlipidemia, unspecified: Secondary | ICD-10-CM

## 2024-06-10 DIAGNOSIS — I420 Dilated cardiomyopathy: Secondary | ICD-10-CM

## 2024-06-10 NOTE — Patient Instructions (Signed)
 Medication Instructions:  Your physician recommends that you continue on your current medications as directed. Please refer to the Current Medication list given to you today.  *If you need a refill on your cardiac medications before your next appointment, please call your pharmacy*  Lab Work: NONE If you have labs (blood work) drawn today and your tests are completely normal, you will receive your results only by: MyChart Message (if you have MyChart) OR A paper copy in the mail If you have any lab test that is abnormal or we need to change your treatment, we will call you to review the results.  Testing/Procedures: Echocardiogram in 1 year Your physician has requested that you have an echocardiogram. Echocardiography is a painless test that uses sound waves to create images of your heart. It provides your doctor with information about the size and shape of your heart and how well your heart's chambers and valves are working. This procedure takes approximately one hour. There are no restrictions for this procedure. Please do NOT wear cologne, perfume, aftershave, or lotions (deodorant is allowed). Please arrive 15 minutes prior to your appointment time.  Please note: We ask at that you not bring children with you during ultrasound (echo/ vascular) testing. Due to room size and safety concerns, children are not allowed in the ultrasound rooms during exams. Our front office staff cannot provide observation of children in our lobby area while testing is being conducted. An adult accompanying a patient to their appointment will only be allowed in the ultrasound room at the discretion of the ultrasound technician under special circumstances. We apologize for any inconvenience.   Follow-Up: At [facility], you and your health needs are our priority.  As part of our continuing mission to provide you with exceptional heart care, our providers are all part of one team.  This team includes your  primary Cardiologist (physician) and Advanced Practice Providers or APPs (Physician Assistants and Nurse Practitioners) who all work together to provide you with the care you need, when you need it.  Your next appointment:   1 year (after Echo)  Provider:   [place], MD  We recommend signing up for the patient portal called MyChart.  Sign up information is provided on this After Visit Summary.  MyChart is used to connect with patients for Virtual Visits (Telemedicine).  Patients are able to view lab/test results, encounter notes, upcoming appointments, etc.  Non-urgent messages can be sent to your provider as well.   To learn more about what you can do with MyChart, go to [email or web address].

## 2024-06-11 ENCOUNTER — Other Ambulatory Visit (HOSPITAL_COMMUNITY): Payer: Self-pay

## 2024-06-13 ENCOUNTER — Other Ambulatory Visit: Payer: Self-pay | Admitting: Family Medicine

## 2024-06-13 DIAGNOSIS — R928 Other abnormal and inconclusive findings on diagnostic imaging of breast: Secondary | ICD-10-CM

## 2024-06-20 ENCOUNTER — Other Ambulatory Visit (HOSPITAL_COMMUNITY): Payer: Self-pay

## 2024-06-21 ENCOUNTER — Ambulatory Visit
Admission: RE | Admit: 2024-06-21 | Discharge: 2024-06-21 | Disposition: A | Discharge status: Home / Self Care | Source: Ambulatory Visit | Attending: Family Medicine | Admitting: Family Medicine

## 2024-06-21 ENCOUNTER — Ambulatory Visit

## 2024-06-21 DIAGNOSIS — R928 Other abnormal and inconclusive findings on diagnostic imaging of breast: Secondary | ICD-10-CM

## 2024-06-21 DIAGNOSIS — R92322 Mammographic fibroglandular density, left breast: Secondary | ICD-10-CM | POA: Diagnosis not present

## 2024-06-21 DIAGNOSIS — D242 Benign neoplasm of left breast: Secondary | ICD-10-CM | POA: Diagnosis not present

## 2024-06-23 DIAGNOSIS — M069 Rheumatoid arthritis, unspecified: Secondary | ICD-10-CM | POA: Diagnosis not present

## 2024-06-23 DIAGNOSIS — E785 Hyperlipidemia, unspecified: Secondary | ICD-10-CM | POA: Diagnosis not present

## 2024-06-23 DIAGNOSIS — M1990 Unspecified osteoarthritis, unspecified site: Secondary | ICD-10-CM | POA: Diagnosis not present

## 2024-06-23 DIAGNOSIS — I5022 Chronic systolic (congestive) heart failure: Secondary | ICD-10-CM | POA: Diagnosis not present

## 2024-07-08 ENCOUNTER — Other Ambulatory Visit (HOSPITAL_COMMUNITY): Payer: Self-pay

## 2024-07-24 DIAGNOSIS — M1990 Unspecified osteoarthritis, unspecified site: Secondary | ICD-10-CM | POA: Diagnosis not present

## 2024-07-24 DIAGNOSIS — I5022 Chronic systolic (congestive) heart failure: Secondary | ICD-10-CM | POA: Diagnosis not present

## 2024-07-24 DIAGNOSIS — M069 Rheumatoid arthritis, unspecified: Secondary | ICD-10-CM | POA: Diagnosis not present

## 2024-07-24 DIAGNOSIS — E785 Hyperlipidemia, unspecified: Secondary | ICD-10-CM | POA: Diagnosis not present

## 2024-08-10 ENCOUNTER — Other Ambulatory Visit (HOSPITAL_COMMUNITY): Payer: Self-pay

## 2024-08-10 DIAGNOSIS — M545 Low back pain, unspecified: Secondary | ICD-10-CM | POA: Diagnosis not present

## 2024-08-10 DIAGNOSIS — L749 Eccrine sweat disorder, unspecified: Secondary | ICD-10-CM | POA: Diagnosis not present

## 2024-08-10 DIAGNOSIS — R232 Flushing: Secondary | ICD-10-CM | POA: Diagnosis not present

## 2024-08-10 MED ORDER — CYCLOBENZAPRINE HCL 10 MG PO TABS
10.0000 mg | ORAL_TABLET | Freq: Three times a day (TID) | ORAL | 0 refills | Status: DC | PRN
  Filled 2024-08-10: qty 45, 15d supply, fill #0

## 2024-08-10 MED ORDER — PREDNISONE 20 MG PO TABS
20.0000 mg | ORAL_TABLET | Freq: Every day | ORAL | 0 refills | Status: DC
  Filled 2024-08-10: qty 5, 5d supply, fill #0

## 2024-08-12 ENCOUNTER — Other Ambulatory Visit (HOSPITAL_COMMUNITY): Payer: Self-pay

## 2024-08-12 MED ORDER — ACCU-CHEK GUIDE W/DEVICE KIT
PACK | 0 refills | Status: AC
  Filled 2024-08-12: qty 1, 30d supply, fill #0

## 2024-08-12 MED ORDER — ACCU-CHEK GUIDE TEST VI STRP
ORAL_STRIP | 3 refills | Status: AC
  Filled 2024-08-12: qty 100, 90d supply, fill #0

## 2024-08-12 MED ORDER — ACCU-CHEK SOFTCLIX LANCETS MISC
3 refills | Status: AC
  Filled 2024-08-12: qty 100, 90d supply, fill #0

## 2024-08-23 DIAGNOSIS — E785 Hyperlipidemia, unspecified: Secondary | ICD-10-CM | POA: Diagnosis not present

## 2024-08-23 DIAGNOSIS — M069 Rheumatoid arthritis, unspecified: Secondary | ICD-10-CM | POA: Diagnosis not present

## 2024-08-23 DIAGNOSIS — I5022 Chronic systolic (congestive) heart failure: Secondary | ICD-10-CM | POA: Diagnosis not present

## 2024-08-23 DIAGNOSIS — M1990 Unspecified osteoarthritis, unspecified site: Secondary | ICD-10-CM | POA: Diagnosis not present

## 2024-08-31 ENCOUNTER — Other Ambulatory Visit (HOSPITAL_COMMUNITY): Payer: Self-pay

## 2024-08-31 DIAGNOSIS — Z713 Dietary counseling and surveillance: Secondary | ICD-10-CM | POA: Diagnosis not present

## 2024-08-31 DIAGNOSIS — E790 Hyperuricemia without signs of inflammatory arthritis and tophaceous disease: Secondary | ICD-10-CM | POA: Diagnosis not present

## 2024-08-31 DIAGNOSIS — Z23 Encounter for immunization: Secondary | ICD-10-CM | POA: Diagnosis not present

## 2024-08-31 DIAGNOSIS — E785 Hyperlipidemia, unspecified: Secondary | ICD-10-CM | POA: Diagnosis not present

## 2024-08-31 DIAGNOSIS — M069 Rheumatoid arthritis, unspecified: Secondary | ICD-10-CM | POA: Diagnosis not present

## 2024-08-31 DIAGNOSIS — I5022 Chronic systolic (congestive) heart failure: Secondary | ICD-10-CM | POA: Diagnosis not present

## 2024-08-31 DIAGNOSIS — I10 Essential (primary) hypertension: Secondary | ICD-10-CM | POA: Diagnosis not present

## 2024-08-31 DIAGNOSIS — R7303 Prediabetes: Secondary | ICD-10-CM | POA: Diagnosis not present

## 2024-08-31 DIAGNOSIS — E559 Vitamin D deficiency, unspecified: Secondary | ICD-10-CM | POA: Diagnosis not present

## 2024-08-31 DIAGNOSIS — Z0000 Encounter for general adult medical examination without abnormal findings: Secondary | ICD-10-CM | POA: Diagnosis not present

## 2024-08-31 MED ORDER — ZEPBOUND 12.5 MG/0.5ML ~~LOC~~ SOAJ
12.5000 mg | SUBCUTANEOUS | 1 refills | Status: AC
  Filled 2024-08-31: qty 2, 28d supply, fill #0
  Filled 2024-09-24: qty 2, 28d supply, fill #1
  Filled 2024-10-18: qty 2, 28d supply, fill #2
  Filled 2024-11-08: qty 2, 28d supply, fill #3
  Filled 2024-12-10: qty 2, 28d supply, fill #4

## 2024-09-02 DIAGNOSIS — H02054 Trichiasis without entropian left upper eyelid: Secondary | ICD-10-CM | POA: Diagnosis not present

## 2024-09-02 DIAGNOSIS — H52223 Regular astigmatism, bilateral: Secondary | ICD-10-CM | POA: Diagnosis not present

## 2024-09-03 ENCOUNTER — Other Ambulatory Visit (HOSPITAL_BASED_OUTPATIENT_CLINIC_OR_DEPARTMENT_OTHER): Payer: Self-pay | Admitting: Family Medicine

## 2024-09-03 DIAGNOSIS — E2839 Other primary ovarian failure: Secondary | ICD-10-CM

## 2024-09-21 NOTE — Progress Notes (Signed)
 Sent message, via epic in basket, requesting orders in epic from Careers adviser.

## 2024-09-27 NOTE — Patient Instructions (Addendum)
 SURGICAL WAITING ROOM VISITATION Patients having surgery or a procedure may have no more than 2 support people in the waiting area - these visitors may rotate.    Children under the age of 84 must have an adult with them who is not the patient.  If the patient needs to stay at the hospital during part of their recovery, the visitor guidelines for inpatient rooms apply. Pre-op nurse will coordinate an appropriate time for 1 support person to accompany patient in pre-op.  This support person may not rotate.    Please refer to the [facility] website for the visitor guidelines for Inpatients (after your surgery is over and you are in a regular room).       Your procedure is scheduled on: 10-11-24   Report to [facility] Main Entrance    Report to admitting at 5:15 AM   Call this number if you have problems the morning of surgery [phone]   Do not eat food :After Midnight.   After Midnight you may have the following liquids until 4:30 AM DAY OF SURGERY  Water  Non-Citrus Juices (without pulp, NO RED-Apple, White grape, White cranberry) Black Coffee (NO MILK/CREAM OR CREAMERS, sugar ok)  Clear Tea (NO MILK/CREAM OR CREAMERS, sugar ok) regular and decaf                             Plain Jell-O (NO RED)                                           Fruit ices (not with fruit pulp, NO RED)                                     Popsicles (NO RED)                                                               Sports drinks like Gatorade (NO RED)                   The day of surgery:  Drink ONE (1) Pre-Surgery G2 by 4:30 AM the morning of surgery. Drink in one sitting. Do not sip.  This drink was given to you during your hospital  pre-op appointment visit. Nothing else to drink after completing the Pre-Surgery G2.          If you have questions, please contact your surgeon's office.   FOLLOW  ANY ADDITIONAL PRE OP INSTRUCTIONS YOU RECEIVED FROM YOUR SURGEON'S OFFICE!!!      Oral Hygiene is also important to reduce your risk of infection.                                    Remember - BRUSH YOUR TEETH THE MORNING OF SURGERY WITH YOUR REGULAR TOOTHPASTE   Do NOT smoke after Midnight   Take these medicines the morning of surgery with A SIP OF WATER :    Atorvastatin    Carvedilol   If needed Tylenol , Valacyclovir   Stop all vitamins and herbal supplements 7 days before surgery  Hold Zepbound  7 days before surgery (do not take after 10-03-24)  Bring CPAP mask and tubing day of surgery.                              You may not have any metal on your body including hair pins, jewelry, and body piercing             Do not wear make-up, lotions, powders, perfumes or deodorant  Do not wear nail polish including gel and S&S, artificial/acrylic nails, or any other type of covering on natural nails including finger and toenails. If you have artificial nails, gel coating, etc. that needs to be removed by a nail salon please have this removed prior to surgery or surgery may need to be canceled/ delayed if the surgeon/ anesthesia feels like they are unable to be safely monitored.   Do not shave  48 hours prior to surgery.         Do not bring valuables to the hospital. [facility] IS NOT RESPONSIBLE   FOR VALUABLES.   Contacts, dentures or bridgework may not be worn into surgery.  DO NOT BRING YOUR HOME MEDICATIONS TO THE HOSPITAL. PHARMACY WILL DISPENSE MEDICATIONS LISTED ON YOUR MEDICATION LIST TO YOU DURING YOUR ADMISSION IN THE HOSPITAL!    Patients discharged on the day of surgery will not be allowed to drive home.  Someone NEEDS to stay with you for the first 24 hours after anesthesia.   Special Instructions: Bring a copy of your healthcare power of attorney and living will documents the day of surgery if you haven't scanned them before.              Please read over the following fact sheets you were given: IF YOU HAVE QUESTIONS ABOUT YOUR PRE-OP INSTRUCTIONS  PLEASE CALL [phone] Gwen  If you received a COVID test during your pre-op visit  it is requested that you wear a mask when out in public, stay away from anyone that may not be feeling well and notify your surgeon if you develop symptoms. If you test positive for Covid or have been in contact with anyone that has tested positive in the last 10 days please notify you surgeon.   Pre-operative 4 CHG Bath Instructions  DYNA-Hex 4 Chlorhexidine  Gluconate 4% Solution Antiseptic 4 fl. oz   You can play a key role in reducing the risk of infection after surgery. Your skin needs to be as free of germs as possible. You can reduce the number of germs on your skin by washing with CHG (chlorhexidine  gluconate) soap before surgery. CHG is an antiseptic soap that kills germs and continues to kill germs even after washing.   DO NOT use if you have an allergy to chlorhexidine /CHG or antibacterial soaps. If your skin becomes reddened or irritated, stop using the CHG and notify one of our RNs at   Please shower with the CHG soap starting 4 days before surgery using the following schedule:     Please keep in mind the following:  DO NOT shave, including legs and underarms, starting the day of your first shower.   You may shave your face at any point before/day of surgery.  Place clean sheets on your bed the day you start using CHG soap. Use a clean washcloth (not used since being washed) for  each shower. DO NOT sleep with pets once you start using the CHG.  CHG Shower Instructions:  If you choose to wash your hair and private area, wash first with your normal shampoo/soap.  After you use shampoo/soap, rinse your hair and body thoroughly to remove shampoo/soap residue.  Turn the water  OFF and apply about 3 tablespoons (45 ml) of CHG soap to a CLEAN washcloth.  Apply CHG soap ONLY FROM YOUR NECK DOWN TO YOUR TOES (washing for 3-5 minutes)  DO NOT use CHG soap on face, private areas, open wounds, or sores.   Pay special attention to the area where your surgery is being performed.  If you are having back surgery, having someone wash your back for you may be helpful. Wait 2 minutes after CHG soap is applied, then you may rinse off the CHG soap.  Pat dry with a clean towel  Put on clean clothes/pajamas   If you choose to wear lotion, please use ONLY the CHG-compatible lotions on the back of this paper.     Additional instructions for the day of surgery: DO NOT APPLY any lotions, deodorants, cologne, or perfumes.   Put on clean/comfortable clothes.  Brush your teeth.  Ask your nurse before applying any prescription medications to the skin.   CHG Compatible Lotions   Aveeno Moisturizing lotion  Cetaphil Moisturizing Cream  Cetaphil Moisturizing Lotion  Clairol Herbal Essence Moisturizing Lotion, Dry Skin  Clairol Herbal Essence Moisturizing Lotion, Extra Dry Skin  Clairol Herbal Essence Moisturizing Lotion, Normal Skin  Curel Age Defying Therapeutic Moisturizing Lotion with Alpha Hydroxy  Curel Extreme Care Body Lotion  Curel Soothing Hands Moisturizing Hand Lotion  Curel Therapeutic Moisturizing Cream, Fragrance-Free  Curel Therapeutic Moisturizing Lotion, Fragrance-Free  Curel Therapeutic Moisturizing Lotion, Original Formula  Eucerin Daily Replenishing Lotion  Eucerin Dry Skin Therapy Plus Alpha Hydroxy Crme  Eucerin Dry Skin Therapy Plus Alpha Hydroxy Lotion  Eucerin Original Crme  Eucerin Original Lotion  Eucerin Plus Crme Eucerin Plus Lotion  Eucerin TriLipid Replenishing Lotion  Keri Anti-Bacterial Hand Lotion  Keri Deep Conditioning Original Lotion Dry Skin Formula Softly Scented  Keri Deep Conditioning Original Lotion, Fragrance Free Sensitive Skin Formula  Keri Lotion Fast Absorbing Fragrance Free Sensitive Skin Formula  Keri Lotion Fast Absorbing Softly Scented Dry Skin Formula  Keri Original Lotion  Keri Skin Renewal Lotion Keri Silky Smooth Lotion  Keri Silky Smooth  Sensitive Skin Lotion  Nivea Body Creamy Conditioning Oil  Nivea Body Extra Enriched Lotion  Nivea Body Original Lotion  Nivea Body Sheer Moisturizing Lotion Nivea Crme  Nivea Skin Firming Lotion  NutraDerm 30 Skin Lotion  NutraDerm Skin Lotion  NutraDerm Therapeutic Skin Cream  NutraDerm Therapeutic Skin Lotion  ProShield Protective Hand Cream  Provon moisturizing lotion   PATIENT SIGNATURE_________________________________  NURSE SIGNATURE__________________________________  ________________________________________________________________________    Nasario Exon  An incentive spirometer is a tool that can help keep your lungs clear and active. This tool measures how well you are filling your lungs with each breath. Taking long deep breaths may help reverse or decrease the chance of developing breathing (pulmonary) problems (especially infection) following: A long period of time when you are unable to move or be active. BEFORE THE PROCEDURE  If the spirometer includes an indicator to show your best effort, your nurse or respiratory therapist will set it to a desired goal. If possible, sit up straight or lean slightly forward. Try not to slouch. Hold the incentive spirometer in an upright position. INSTRUCTIONS FOR USE  Sit on the edge of your bed if possible, or sit up as far as you can in bed or on a chair. Hold the incentive spirometer in an upright position. Breathe out normally. Place the mouthpiece in your mouth and seal your lips tightly around it. Breathe in slowly and as deeply as possible, raising the piston or the ball toward the top of the column. Hold your breath for 3-5 seconds or for as long as possible. Allow the piston or ball to fall to the bottom of the column. Remove the mouthpiece from your mouth and breathe out normally. Rest for a few seconds and repeat Steps 1 through 7 at least 10 times every 1-2 hours when you are awake. Take your time and take a  few normal breaths between deep breaths. The spirometer may include an indicator to show your best effort. Use the indicator as a goal to work toward during each repetition. After each set of 10 deep breaths, practice coughing to be sure your lungs are clear. If you have an incision (the cut made at the time of surgery), support your incision when coughing by placing a pillow or rolled up towels firmly against it. Once you are able to get out of bed, walk around indoors and cough well. You may stop using the incentive spirometer when instructed by your caregiver.  RISKS AND COMPLICATIONS Take your time so you do not get dizzy or light-headed. If you are in pain, you may need to take or ask for pain medication before doing incentive spirometry. It is harder to take a deep breath if you are having pain. AFTER USE Rest and breathe slowly and easily. It can be helpful to keep track of a log of your progress. Your caregiver can provide you with a simple table to help with this. If you are using the spirometer at home, follow these instructions: SEEK MEDICAL CARE IF:  You are having difficultly using the spirometer. You have trouble using the spirometer as often as instructed. Your pain medication is not giving enough relief while using the spirometer. You develop fever of 100.5 F (38.1 C) or higher. SEEK IMMEDIATE MEDICAL CARE IF:  You cough up bloody sputum that had not been present before. You develop fever of 102 F (38.9 C) or greater. You develop worsening pain at or near the incision site. MAKE SURE YOU:  Understand these instructions. Will watch your condition. Will get help right away if you are not doing well or get worse. Document Released: 03/23/2007 Document Revised: 02/02/2012 Document Reviewed: 05/24/2007 [facility] Patient Information 2014 [place], [place].

## 2024-09-27 NOTE — Progress Notes (Signed)
  Date of COVID positive in last 90 days:  PCP - Anthony Hint, FNP Cardiologist - Lynwood Schilling, MD  Chest x-ray - N/A EKG - 06-10-24 Epic Stress Test - N/A ECHO - 09-14-23 Epic Cardiac Cath -  Coronary CT - 11-03-23 Epic Pacemaker/ICD device last checked:N/A Spinal Cord Stimulator:N/A  Bowel Prep - N/A  Sleep Study - Yes, +sleep apnea CPAP -   Prediabetes Fasting Blood Sugar - N/A Checks Blood Sugar _____ times a day  Zepbound  Last dose of GLP1 agonist-  N/A GLP1 instructions:  Do not take after  10-03-24   Last dose of SGLT-2 inhibitors-  N/A SGLT-2 instructions:  Do not take after     Blood Thinner Instructions: N/A Last dose:   Time: Aspirin  Instructions:N/A Last Dose:  Activity level:  Can go up a flight of stairs and perform activities of daily living without stopping and without symptoms of chest pain or shortness of breath.  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 Patient was also instructed that they will need to review over the PAT instructions again at home before surgery.

## 2024-09-28 ENCOUNTER — Encounter (HOSPITAL_COMMUNITY): Payer: Self-pay

## 2024-09-28 ENCOUNTER — Encounter (HOSPITAL_COMMUNITY)
Admission: RE | Admit: 2024-09-28 | Discharge: 2024-09-28 | Disposition: A | Discharge status: Home / Self Care | Source: Ambulatory Visit | Attending: Orthopedic Surgery | Admitting: Orthopedic Surgery

## 2024-09-28 ENCOUNTER — Other Ambulatory Visit: Payer: Self-pay

## 2024-09-28 VITALS — BP 134/89 | HR 67 | Temp 98.3°F | Resp 16 | Ht 63.0 in | Wt 168.0 lb

## 2024-09-28 DIAGNOSIS — G4730 Sleep apnea, unspecified: Secondary | ICD-10-CM | POA: Insufficient documentation

## 2024-09-28 DIAGNOSIS — I447 Left bundle-branch block, unspecified: Secondary | ICD-10-CM | POA: Diagnosis not present

## 2024-09-28 DIAGNOSIS — I10 Essential (primary) hypertension: Secondary | ICD-10-CM | POA: Diagnosis not present

## 2024-09-28 DIAGNOSIS — Z01812 Encounter for preprocedural laboratory examination: Secondary | ICD-10-CM | POA: Insufficient documentation

## 2024-09-28 DIAGNOSIS — Z01818 Encounter for other preprocedural examination: Secondary | ICD-10-CM

## 2024-09-28 DIAGNOSIS — M1711 Unilateral primary osteoarthritis, right knee: Secondary | ICD-10-CM | POA: Diagnosis not present

## 2024-09-28 LAB — BASIC METABOLIC PANEL WITH GFR
Anion gap: 9 (ref 5–15)
BUN: 23 mg/dL (ref 8–23)
CO2: 30 mmol/L (ref 22–32)
Calcium: 9.7 mg/dL (ref 8.9–10.3)
Chloride: 101 mmol/L (ref 98–111)
Creatinine, Ser: 0.84 mg/dL (ref 0.44–1.00)
GFR, Estimated: >60 mL/min (ref >60)
Glucose, Bld: 70 mg/dL (ref 70–99)
Potassium: 3.3 mmol/L — ABNORMAL LOW (ref 3.5–5.1)
Sodium: 140 mmol/L (ref 135–145)

## 2024-09-28 LAB — CBC
HCT: 39.3 % (ref 36.0–46.0)
Hemoglobin: 12.4 g/dL (ref 12.0–15.0)
MCH: 28.2 pg (ref 26.0–34.0)
MCHC: 31.6 g/dL (ref 30.0–36.0)
MCV: 89.3 fL (ref 80.0–100.0)
Platelets: 199 K/uL (ref 150–400)
RBC: 4.4 MIL/uL (ref 3.87–5.11)
RDW: 13.7 % (ref 11.5–15.5)
WBC: 3.7 K/uL — ABNORMAL LOW (ref 4.0–10.5)
nRBC: 0 % (ref 0.0–0.2)

## 2024-09-28 LAB — SURGICAL PCR SCREEN
MRSA, PCR: NEGATIVE
Staphylococcus aureus: NEGATIVE

## 2024-09-29 NOTE — Anesthesia Preprocedure Evaluation (Addendum)
 Anesthesia Evaluation  Patient identified by MRN, date of birth, ID band Patient awake    Reviewed: Allergy & Precautions, H&P , NPO status , Patient's Chart, lab work & pertinent test results  Airway Mallampati: II   Neck ROM: full    Dental   Pulmonary asthma , sleep apnea    breath sounds clear to auscultation       Cardiovascular hypertension, +CHF   Rhythm:regular Rate:Normal     Neuro/Psych    GI/Hepatic   Endo/Other    Renal/GU      Musculoskeletal  (+) Arthritis ,    Abdominal   Peds  Hematology   Anesthesia Other Findings   Reproductive/Obstetrics                              Anesthesia Physical Anesthesia Plan  ASA: 3  Anesthesia Plan: Spinal and MAC   Post-op Pain Management: Regional block*   Induction: Intravenous  PONV Risk Score and Plan: 2 and Propofol  infusion, Treatment may vary due to age or medical condition and Midazolam   Airway Management Planned: Simple Face Mask  Additional Equipment:   Intra-op Plan:   Post-operative Plan: Extubation in OR  Informed Consent: I have reviewed the patients History and Physical, chart, labs and discussed the procedure including the risks, benefits and alternatives for the proposed anesthesia with the patient or authorized representative who has indicated his/her understanding and acceptance.     Dental advisory given  Plan Discussed with: CRNA, Anesthesiologist and Surgeon  Anesthesia Plan Comments: (See PAT note 09/28/2024)         Anesthesia Quick Evaluation

## 2024-09-29 NOTE — Progress Notes (Signed)
 Anesthesia Chart Review   Case: [id number] Date/Time: 10/11/24 0715   Procedure: ARTHROPLASTY, KNEE, TOTAL (Right: Knee)   Anesthesia type: Spinal   Pre-op diagnosis: OA RIGHT KNEE   Location: [facility] ROOM 07 / [facility]   Surgeons: Beverley Evalene BIRCH, MD       DISCUSSION:63 y.o. never smoker with h/o HTN, sleep apnea uses CPAP, chronic LBBB, CHF, right knee OA scheduled for above procedure 10/11/2024 with Dr. Evalene Beverley.   Pt last seen by cardiology 12/12/2023. Per OV note, The patient has a reasonable functional level. She has no coronary artery disease. There are no high risk findings. Though she has a reduced ejection fraction she does not have decompensated heart failure. Based on ACC/AHA guidelines the patient is at acceptable risk for the planned procedure without further testing.  Pt advised to hold GLP1 1 week prior to surgery.  VS: BP 134/89   Pulse 67   Temp 36.8 C (Oral)   Resp 16   Ht 5' 3 (1.6 m)   Wt 76.2 kg   SpO2 100%   BMI 29.76 kg/m   PROVIDERS: Dyane Anthony RAMAN, FNP is PCP   Cardiologist - Lynwood Schilling, MD  LABS: Labs reviewed: Acceptable for surgery. (all labs ordered are listed, but only abnormal results are displayed)  Labs Reviewed  BASIC METABOLIC PANEL WITH GFR - Abnormal; Notable for the following components:      Result Value   Potassium 3.3 (*)    All other components within normal limits  CBC - Abnormal; Notable for the following components:   WBC 3.7 (*)    All other components within normal limits  SURGICAL PCR SCREEN     IMAGES:   EKG:   CV: CT Coronary 11/03/2023 IMPRESSION: 1. No evidence of CAD, 0% stenosis, CADRADS 0.   2. Total plaque volume 0 mm3.   3. Coronary calcium  score of 0.   4. Normal coronary origins with right dominance.  Echo 09/14/23  1. Left ventricular ejection fraction, by estimation, is 45 to 50%. The  left ventricle has mildly decreased function. The left ventricle  demonstrates regional wall  motion abnormalities (see scoring  diagram/findings for description). Left ventricular  diastolic parameters are indeterminate. There is hypokinesis of the left  ventricular, basal-mid septal wall. There is dyskinesis of the left  ventricular, entire inferior wall. The average left ventricular global  longitudinal strain is -13.9 %. The global   longitudinal strain is abnormal.   2. Right ventricular systolic function is normal. The right ventricular  size is normal. There is normal pulmonary artery systolic pressure.   3. The mitral valve is normal in structure. Trivial mitral valve  regurgitation. No evidence of mitral stenosis.   4. The aortic valve is tricuspid. There is mild calcification of the  aortic valve. There is mild thickening of the aortic valve. Aortic valve  regurgitation is not visualized. Aortic valve sclerosis is present, with  no evidence of aortic valve stenosis.   5. Aortic dilatation noted. There is borderline dilatation of the  ascending aorta, measuring 38 mm.   6. The inferior vena cava is normal in size with greater than 50%  respiratory variability, suggesting right atrial pressure of 3 mmHg.   Past Medical History:  Diagnosis Date   Asthma    CHF (congestive heart failure) (HCC)    Hypertension    Joint pain    Lumbar herniated disc    OA (osteoarthritis)    Obesity  Pre-diabetes    Rheumatoid arthritis (HCC)    Sleep apnea    CPAP   SOB (shortness of breath)     Past Surgical History:  Procedure Laterality Date   BREAST SURGERY     breast reduction    LAPAROSCOPIC GASTRIC SLEEVE RESECTION N/A 09/29/2016   Procedure: LAPAROSCOPIC GASTRIC SLEEVE RESECTION, UPPER ENDO;  Surgeon: Camellia Blush, MD;  Location: [facility];  Service: General;  Laterality: N/A;   REDUCTION MAMMAPLASTY Bilateral 2003   removed bone from left knee      TOTAL KNEE ARTHROPLASTY Left 12/29/2023   Procedure: LEFT TOTAL KNEE ARTHROPLASTY;  Surgeon: Beverley Evalene BIRCH, MD;   Location: [facility];  Service: Orthopedics;  Laterality: Left;   UPPER GI ENDOSCOPY  09/29/2016   Procedure: UPPER GI ENDOSCOPY;  Surgeon: Camellia Blush, MD;  Location: [facility];  Service: General;;    MEDICATIONS:  Accu-Chek Softclix Lancets lancets   acetaminophen  (TYLENOL ) 500 MG tablet   acetaminophen  (TYLENOL ) 650 MG CR tablet   albuterol  (PROVENTIL ) (2.5 MG/3ML) 0.083% nebulizer solution   albuterol  (VENTOLIN  HFA) 108 (90 Base) MCG/ACT inhaler   allopurinol  (ZYLOPRIM ) 100 MG tablet   aspirin  EC 81 MG tablet   atorvastatin  (LIPITOR) 10 MG tablet   atorvastatin  (LIPITOR) 40 MG tablet   Blood Glucose Monitoring Suppl (ACCU-CHEK GUIDE) w/Device KIT   carvedilol  (COREG ) 6.25 MG tablet   cyclobenzaprine  (FLEXERIL ) 10 MG tablet   diclofenac  (VOLTAREN ) 75 MG EC tablet   docusate sodium  (COLACE) 100 MG capsule   furosemide  (LASIX ) 20 MG tablet   glucose blood (ACCU-CHEK GUIDE TEST) test strip   methocarbamol  (ROBAXIN -750) 750 MG tablet   mupirocin  ointment (BACTROBAN ) 2 %   ondansetron  (ZOFRAN -ODT) 4 MG disintegrating tablet   oxyCODONE  (ROXICODONE ) 5 MG immediate release tablet   potassium chloride  (KLOR-CON  M) 10 MEQ tablet   predniSONE  (DELTASONE ) 20 MG tablet   tirzepatide  (MOUNJARO ) 10 MG/0.5ML Pen   tirzepatide  (ZEPBOUND ) 10 MG/0.5ML Pen   tirzepatide  (ZEPBOUND ) 10 MG/0.5ML Pen   tirzepatide  (ZEPBOUND ) 12.5 MG/0.5ML Pen   tirzepatide  (ZEPBOUND ) 5 MG/0.5ML Pen   tirzepatide  (ZEPBOUND ) 7.5 MG/0.5ML Pen   valACYclovir  (VALTREX ) 500 MG tablet   No current facility-administered medications for this encounter.     Harlene Hoots Ward, PA-C [facility] [phone]

## 2024-10-04 NOTE — H&P (Signed)
 KNEE ARTHROPLASTY ADMISSION H&P  Patient ID: [name] [name] MRN: [id number] DOB/AGE: 01-07-1961 63 y.o.  Chief Complaint: right knee pain.  Planned Procedure Date: 10/11/24 Medical Clearance by Clemencia Hint NP   Cardiac Clearance by Dr. Lynwood Schilling   HPI: [name] [name] is a 63 y.o. female who presents for evaluation of OA RIGHT KNEE. The patient has a history of pain and functional disability in the right knee due to arthritis and has failed non-surgical conservative treatments for greater than 12 weeks to include NSAID's and/or analgesics, corticosteriod injections, viscosupplementation injections, use of assistive devices, and activity modification.  Onset of symptoms was gradual, starting 5 years ago with gradually worsening course since that time. The patient noted no past surgery on the right knee.  Patient currently rates pain at 8 out of 10 with activity. Patient has night pain, worsening of pain with activity and weight bearing, and pain that interferes with activities of daily living.  Patient has evidence of subchondral sclerosis, periarticular osteophytes, and joint space narrowing by imaging studies.  There is no active infection.  Past Medical History:  Diagnosis Date   Asthma    CHF (congestive heart failure) (HCC)    Hypertension    Joint pain    Lumbar herniated disc    OA (osteoarthritis)    Obesity    Pre-diabetes    Rheumatoid arthritis (HCC)    Sleep apnea    CPAP   SOB (shortness of breath)    Past Surgical History:  Procedure Laterality Date   BREAST SURGERY     breast reduction    LAPAROSCOPIC GASTRIC SLEEVE RESECTION N/A 09/29/2016   Procedure: LAPAROSCOPIC GASTRIC SLEEVE RESECTION, UPPER ENDO;  Surgeon: Camellia Blush, MD;  Location: [facility];  Service: General;  Laterality: N/A;   REDUCTION MAMMAPLASTY Bilateral 2003   removed bone from left knee      TOTAL KNEE ARTHROPLASTY Left 12/29/2023   Procedure: LEFT TOTAL KNEE  ARTHROPLASTY;  Surgeon: Beverley Evalene BIRCH, MD;  Location: [facility];  Service: Orthopedics;  Laterality: Left;   UPPER GI ENDOSCOPY  09/29/2016   Procedure: UPPER GI ENDOSCOPY;  Surgeon: Camellia Blush, MD;  Location: [facility];  Service: General;;   Allergies  Allergen Reactions   Penicillins Anaphylaxis, Hives, Shortness Of Breath, Nausea And Vomiting, Itching and Other (See Comments)    Has patient had a PCN reaction causing immediate rash, facial/tongue/throat swelling, SOB or lightheadedness with hypotension: yes  Has patient had a PCN reaction causing severe rash involving mucus membranes or skin necrosis: yes  Has patient had a PCN reaction that required hospitalization no  Has patient had a PCN reaction occurring within the last 10 years: yes  If all of the above answers are NO, then may proceed with Cephalosporin use.  Upset stomach  Has patient had a PCN reaction causing immediate rash, facial/tongue/throat swelling, SOB or lightheadedness with hypotension: yes  Has patient had a PCN reaction causing severe rash involving mucus membranes or skin necrosis: yes  Has patient had a PCN reaction that required hospitalization no  Has patient had a PCN reaction occurring within the last 10 years: yes  If all of the above answers are NO, then may proceed with Cephalosporin use.  Other reaction(s): Shortness Of Breath  Has patient had a PCN reaction causing immediate rash, facial/tongue/throat swelling, SOB or lightheadedness with hypotension: yes  Has patient had a PCN reaction causing severe rash involving mucus membranes or skin necrosis: yes  Has  patient had a PCN reaction that required hospitalization no  Has patient had a PCN reaction occurring within the last 10 years: yes  If all of the above answers are NO, then may proceed with Cephalosporin use.  Upset stomach  Other reaction(s): Shortness Of Breath, Has patient had a PCN reaction causing immediate rash, facial/tongue/throat  swelling, SOB or lightheadedness with hypotension: yes, Has patient had a PCN reaction causing severe rash involving mucus membranes or skin necrosis: yes, Has patient had a PCN reaction that required hospitalization no, Has patient had a PCN reaction occurring within the last 10 years: yes, If all of the above answers are NO, then may proceed with Cephalosporin use.  Upset stomach   Amoxicillin  Other (See Comments)    Upset stomach   Tetanus-Diphth-Acell Pertussis Rash and Other (See Comments)    Headache /dehydration/rash/itching   Celecoxib  Other (See Comments)   Tdap [Tetanus-Diphth-Acell Pertussis] Other (See Comments)    Headache /dehydration   Prior to Admission medications   Medication Sig Start Date End Date Taking? Authorizing Provider  acetaminophen  (TYLENOL ) 650 MG CR tablet Take 650 mg by mouth every 8 (eight) hours as needed for pain.   Yes [provider]  atorvastatin  (LIPITOR) 10 MG tablet Take 10 mg by mouth daily.   Yes [provider]  carvedilol  (COREG ) 6.25 MG tablet Take 6.25 mg by mouth 2 (two) times daily with a meal.   Yes [provider]  furosemide  (LASIX ) 20 MG tablet Take 20 mg by mouth daily.   Yes [provider]  mupirocin  ointment (BACTROBAN ) 2 % Apply to affected area 2 times a day as needed for 7 days Patient taking differently: Apply 1 Application topically daily as needed (irritation). 12/21/23  Yes   potassium chloride  (KLOR-CON  M) 10 MEQ tablet Take 10 mEq by mouth daily. 08/18/23  Yes [provider]  tirzepatide  (ZEPBOUND ) 10 MG/0.5ML Pen Inject 10 mg into the skin once a week. 05/30/24  Yes   valACYclovir  (VALTREX ) 500 MG tablet TAKE 1 TABLET BY MOUTH EVERY DAY Patient taking differently: Take 500 mg by mouth daily as needed (Cold sores). 06/14/15  Yes Le, Thao P, DO  Accu-Chek Softclix Lancets lancets Check glucose once daily and as needed. 08/11/24     acetaminophen  (TYLENOL ) 500 MG tablet Take 2 tablets  (1,000 mg total) by mouth every 6 (six) hours as needed for mild pain (pain score 1-3) or moderate pain (pain score 4-6). Patient not taking: Reported on 09/26/2024 12/29/23   Benno Brensinger M, PA-C  albuterol  (PROVENTIL ) (2.5 MG/3ML) 0.083% nebulizer solution Use 1 vial (3 mLs) by nebulization every 4 hours as needed for 10 days. Patient not taking: Reported on 09/26/2024 03/10/22     albuterol  (VENTOLIN  HFA) 108 (90 Base) MCG/ACT inhaler Inhale 2 puffs into the lungs every 4 (four) hours as needed. Patient not taking: Reported on 09/26/2024 03/10/22     allopurinol  (ZYLOPRIM ) 100 MG tablet Take 0.5 tablets (50 mg total) by mouth daily. Patient not taking: Reported on 09/26/2024 10/14/23     aspirin  EC 81 MG tablet Take 1 tablet (81 mg total) by mouth 2 (two) times daily. To prevent blood clots for 30 days after surgery. Patient not taking: Reported on 09/26/2024 12/29/23   Takiah Maiden M, PA-C  atorvastatin  (LIPITOR) 40 MG tablet Take 1 tablet (40 mg total) by mouth daily. Patient not taking: Reported on 09/26/2024 09/29/23   Meng, Hao, PA  Blood Glucose Monitoring Suppl (ACCU-CHEK GUIDE) w/Device  KIT Check glucose once daily and as needed. 08/11/24     cyclobenzaprine  (FLEXERIL ) 10 MG tablet Take 1 tablet (10 mg total) by mouth 3 (three) times daily as needed. Patient not taking: Reported on 09/26/2024 08/10/24     diclofenac  (VOLTAREN ) 75 MG EC tablet Take 1 tablet (75 mg total) by mouth 2 (two) times daily as needed for mild pain (pain score 1-3) or moderate pain (pain score 4-6) (and swelling). Patient not taking: Reported on 09/26/2024 12/29/23   Aidynn Polendo M, PA-C  docusate sodium  (COLACE) 100 MG capsule Take 1 capsule (100 mg total) by mouth 2 (two) times daily as needed for mild constipation or moderate constipation. Patient not taking: Reported on 09/26/2024 12/29/23 12/28/24  Ondrea Dow M, PA-C  glucose blood (ACCU-CHEK GUIDE TEST) test strip Check glucose once daily and as needed 08/11/24      methocarbamol  (ROBAXIN -750) 750 MG tablet Take 1 tablet (750 mg total) by mouth every 8 (eight) hours as needed for muscle spasms. Patient not taking: Reported on 09/26/2024 12/29/23   Fiana Gladu M, PA-C  ondansetron  (ZOFRAN -ODT) 4 MG disintegrating tablet Take 1 tablet (4 mg total) by mouth every 8 (eight) hours as needed for nausea or vomiting. Patient not taking: Reported on 09/26/2024 12/29/23   Sherree Shankman M, PA-C  oxyCODONE  (ROXICODONE ) 5 MG immediate release tablet Take 1 tablet (5 mg total) by mouth every 4 (four) hours as needed for severe pain (pain score 7-10). Patient not taking: Reported on 09/26/2024 12/29/23   Thu Baggett M, PA-C  predniSONE  (DELTASONE ) 20 MG tablet Take 1 tablet (20 mg total) by mouth daily with food or milk. Patient not taking: Reported on 09/26/2024 08/10/24     tirzepatide  (MOUNJARO ) 10 MG/0.5ML Pen Inject 10 mg into the skin once a week. Patient not taking: Reported on 09/26/2024 11/23/23     tirzepatide  (ZEPBOUND ) 10 MG/0.5ML Pen Inject 10 mg into the skin once a week. 12/03/23     tirzepatide  (ZEPBOUND ) 12.5 MG/0.5ML Pen Inject 12.5 mg into the skin once a week. 08/31/24     tirzepatide  (ZEPBOUND ) 5 MG/0.5ML Pen Inject 5 mg into the skin once a week. 02/23/24     tirzepatide  (ZEPBOUND ) 7.5 MG/0.5ML Pen Inject 7.5 mg into the skin once a week. Patient not taking: Reported on 09/26/2024 01/28/24      Social History   Socioeconomic History   Marital status: Married    Spouse name: Not on file   Number of children: Not on file   Years of education: Not on file   Highest education level: Not on file  Occupational History   Occupation: Customer Service  Tobacco Use   Smoking status: Never   Smokeless tobacco: Never  Vaping Use   Vaping status: Never Used  Substance and Sexual Activity   Alcohol use: No    Alcohol/week: 0.0 standard drinks of alcohol   Drug use: No   Sexual activity: Yes    Birth control/protection: None  Other Topics Concern   Not on file   Social History Narrative   Not on file   Social Drivers of Health   Financial Resource Strain: Not on file  Food Insecurity: No Food Insecurity (10/06/2017)   Hunger Vital Sign    Worried About Running Out of Food in the Last Year: Never true    Ran Out of Food in the Last Year: Never true  Transportation Needs: Not on file  Physical Activity: Not on file  Stress: Not on  file  Social Connections: Unknown (04/08/2022)   Received from [facility]   Social Network    Social Network: Not on file   Family History  Problem Relation Age of Onset   Ovarian cancer Mother 50       Not sure of this diagnosis   Obesity Mother    Diabetes Brother    Hypertension Brother    Breast cancer Neg Hx     ROS: Currently denies lightheadedness, dizziness, Fever, chills, CP, SOB.   No personal history of DVT, PE, MI, or CVA. No loose teeth or dentures All other systems have been reviewed and were otherwise currently negative with the exception of those mentioned in the HPI and as above.  Objective: Vitals: Ht: 5'3 Wt: 169 lbs Temp: 98.4 BP: 128/78 Pulse: 74 O2 96% on room air.   Physical Exam: General: Alert, NAD.  Antalgic Gait  HEENT: EOMI, Good Neck Extension  Pulm: No increased work of breathing.  Clear B/L A/P w/o crackle or wheeze.  CV: RRR, No m/g/r appreciated  GI: soft, NT, ND. BS x 4 quadrants Neuro: CN II-XII grossly intact without focal deficit.  Sensation intact distally Skin: No lesions in the area of chief complaint MSK/Surgical Site:  + JLT. ROM 20-110 degrees.  4/5 strength in extension and flexion.  +EHL/FHL.  NVI.  Stable varus and valgus stress.    Imaging Review Plain radiographs demonstrate severe degenerative joint disease of the right knee.   The bone quality appears to be fair for age and reported activity level.  Preoperative templating of the joint replacement has been completed, documented, and submitted to the Operating Room personnel in order to optimize  intra-operative equipment management.  Assessment: OA RIGHT KNEE Active Problems:   * No active hospital problems. *   Plan: Plan for Procedure(s): ARTHROPLASTY, KNEE, TOTAL  The patient history, physical exam, clinical judgement of the provider and imaging are consistent with end stage degenerative joint disease and total joint arthroplasty is deemed medically necessary. The treatment options including medical management, injection therapy, and arthroplasty were discussed at length. The risks and benefits of Procedure(s): ARTHROPLASTY, KNEE, TOTAL were presented and reviewed.  The risks of nonoperative treatment, versus surgical intervention including but not limited to continued pain, aseptic loosening, stiffness, dislocation/subluxation, infection, bleeding, nerve injury, blood clots, cardiopulmonary complications, morbidity, mortality, among others were discussed. The patient verbalizes understanding and wishes to proceed with the plan.  Patient is being admitted for inpatient treatment for surgery, pain control, PT, prophylactic antibiotics, VTE prophylaxis, progressive ambulation, ADL's and discharge planning.   Dental prophylaxis discussed and recommended for 2 years postoperatively.  The patient does meet the criteria for TXA which will be used perioperatively.   ASA 81 mg BID will be used postoperatively for DVT prophylaxis in addition to SCDs, and early ambulation. Plan for Tylenol , Mobic , oxycodone  for pain.   Robaxin  for muscle spasms.   Zofran  for nausea and vomiting. Senokot is for constipation prevention. Pharmacy- [facility] The patient is planning to be discharged home with HHPT and into the care of her husband Elsie who can be reached at [phone] Follow up appt 10/26/24 at 4:15pm    Gerard CHRISTELLA Ted DEVONNA Office [phone] 10/04/2024 11:08 AM

## 2024-10-05 NOTE — Care Plan (Signed)
 Ortho Bundle Case Management Note  Patient Details  Name: [name] [name] MRN: [id number] Date of Birth: 1961/08/01  patient met with PA in the office for H&P. she will discharge to home with family. has rolling walker, CPM ordered. HHPT referral to [facility] and OPPT set up with [facility] discharge instructions discussed and question. CM spoke with her today. all questions answered. Patient and MD in agreement with plan. Choice offered.                    DME Arranged:  CPM DME Agency:  [facility]  HH Arranged:  PT HH Agency:  [facility]  Additional Comments: Please contact me with any questions of if this plan should need to change.  Charlies Pitch,  RN,BSN,MHA,CCM  [facility] Orthopaedic Specialist  [phone] 10/05/2024, 3:57 PM

## 2024-10-11 ENCOUNTER — Ambulatory Visit (HOSPITAL_COMMUNITY)
Admission: RE | Admit: 2024-10-11 | Discharge: 2024-10-11 | Disposition: A | Discharge status: Home Health | Attending: Orthopedic Surgery | Admitting: Orthopedic Surgery

## 2024-10-11 ENCOUNTER — Other Ambulatory Visit: Payer: Self-pay

## 2024-10-11 ENCOUNTER — Ambulatory Visit (HOSPITAL_COMMUNITY)

## 2024-10-11 ENCOUNTER — Ambulatory Visit (HOSPITAL_COMMUNITY): Payer: Self-pay | Admitting: Physician Assistant

## 2024-10-11 ENCOUNTER — Other Ambulatory Visit (HOSPITAL_COMMUNITY): Payer: Self-pay

## 2024-10-11 ENCOUNTER — Encounter (HOSPITAL_COMMUNITY): Payer: Self-pay | Admitting: Orthopedic Surgery

## 2024-10-11 ENCOUNTER — Encounter (HOSPITAL_COMMUNITY)
Admission: RE | Disposition: A | Discharge status: Home Health | Payer: Self-pay | Source: Home / Self Care | Attending: Orthopedic Surgery

## 2024-10-11 DIAGNOSIS — Z96651 Presence of right artificial knee joint: Secondary | ICD-10-CM

## 2024-10-11 DIAGNOSIS — G4730 Sleep apnea, unspecified: Secondary | ICD-10-CM | POA: Diagnosis not present

## 2024-10-11 DIAGNOSIS — I110 Hypertensive heart disease with heart failure: Secondary | ICD-10-CM | POA: Insufficient documentation

## 2024-10-11 DIAGNOSIS — M1711 Unilateral primary osteoarthritis, right knee: Secondary | ICD-10-CM | POA: Diagnosis present

## 2024-10-11 DIAGNOSIS — J45909 Unspecified asthma, uncomplicated: Secondary | ICD-10-CM | POA: Diagnosis not present

## 2024-10-11 DIAGNOSIS — I509 Heart failure, unspecified: Secondary | ICD-10-CM | POA: Diagnosis not present

## 2024-10-11 HISTORY — PX: TOTAL KNEE ARTHROPLASTY: SHX125

## 2024-10-11 SURGERY — ARTHROPLASTY, KNEE, TOTAL
Anesthesia: Monitor Anesthesia Care | Site: Knee | Laterality: Right

## 2024-10-11 MED ORDER — PHENYLEPHRINE HCL (PRESSORS) 10 MG/ML IV SOLN
INTRAVENOUS | Status: AC
  Filled 2024-10-11: qty 1

## 2024-10-11 MED ORDER — FENTANYL CITRATE (PF) 50 MCG/ML IJ SOSY: 25.0000 ug | PREFILLED_SYRINGE | INTRAMUSCULAR | Status: DC | PRN

## 2024-10-11 MED ORDER — ONDANSETRON HCL 4 MG/2ML IJ SOLN: 4.0000 mg | Freq: Four times a day (QID) | INTRAMUSCULAR | Status: DC | PRN

## 2024-10-11 MED ORDER — BUPIVACAINE LIPOSOME 1.3 % IJ SUSP: 20.0000 mL | Freq: Once | INTRAMUSCULAR | Status: DC

## 2024-10-11 MED ORDER — SENNA-DOCUSATE SODIUM 8.6-50 MG PO TABS
2.0000 | ORAL_TABLET | Freq: Every day | ORAL | 1 refills | Status: AC | PRN
  Filled 2024-10-11: qty 30, 15d supply, fill #0

## 2024-10-11 MED ORDER — BUPIVACAINE-EPINEPHRINE 0.25% -1:200000 IJ SOLN
INTRAMUSCULAR | Status: DC | PRN
  Administered 2024-10-11: 30 mL

## 2024-10-11 MED ORDER — PHENYLEPHRINE HCL-NACL 20-0.9 MG/250ML-% IV SOLN
INTRAVENOUS | Status: DC | PRN
  Administered 2024-10-11: 40 ug/min via INTRAVENOUS

## 2024-10-11 MED ORDER — CEFAZOLIN SODIUM-DEXTROSE 2-4 GM/100ML-% IV SOLN
2.0000 g | INTRAVENOUS | Status: AC
  Administered 2024-10-11: 2 g via INTRAVENOUS
  Filled 2024-10-11: qty 100

## 2024-10-11 MED ORDER — DEXAMETHASONE SOD PHOSPHATE PF 10 MG/ML IJ SOLN
8.0000 mg | Freq: Once | INTRAMUSCULAR | Status: AC
  Administered 2024-10-11: 8 mg via INTRAVENOUS

## 2024-10-11 MED ORDER — PHENYLEPHRINE HCL (PRESSORS) 10 MG/ML IV SOLN
INTRAVENOUS | Status: AC
Start: 2024-10-11 — End: 2024-10-11
  Filled 2024-10-11: qty 1

## 2024-10-11 MED ORDER — WATER FOR IRRIGATION, STERILE IR SOLN
Status: DC | PRN
  Administered 2024-10-11: 2000 mL

## 2024-10-11 MED ORDER — MIDAZOLAM HCL 5 MG/5ML IJ SOLN
INTRAMUSCULAR | Status: DC | PRN
  Administered 2024-10-11: 2 mg via INTRAVENOUS

## 2024-10-11 MED ORDER — ASPIRIN 81 MG PO TBEC
81.0000 mg | DELAYED_RELEASE_TABLET | Freq: Two times a day (BID) | ORAL | 0 refills | Status: AC
  Filled 2024-10-11: qty 60, 30d supply, fill #0

## 2024-10-11 MED ORDER — OXYCODONE HCL 5 MG PO TABS
5.0000 mg | ORAL_TABLET | Freq: Once | ORAL | Status: AC | PRN
  Administered 2024-10-11: 5 mg via ORAL

## 2024-10-11 MED ORDER — LACTATED RINGERS IV SOLN: INTRAVENOUS | Status: DC

## 2024-10-11 MED ORDER — BUPIVACAINE IN DEXTROSE 0.75-8.25 % IT SOLN
INTRATHECAL | Status: DC | PRN
  Administered 2024-10-11: 1.7 mL via INTRATHECAL

## 2024-10-11 MED ORDER — OXYCODONE HCL 5 MG PO TABS
ORAL_TABLET | ORAL | Status: AC
  Filled 2024-10-11: qty 1

## 2024-10-11 MED ORDER — SODIUM CHLORIDE 0.9 % IR SOLN
Status: DC | PRN
  Administered 2024-10-11: 1000 mL

## 2024-10-11 MED ORDER — ONDANSETRON HCL 4 MG/2ML IJ SOLN
INTRAMUSCULAR | Status: DC | PRN
  Administered 2024-10-11: 4 mg via INTRAVENOUS

## 2024-10-11 MED ORDER — SODIUM CHLORIDE 0.9% FLUSH
INTRAVENOUS | Status: DC | PRN
  Administered 2024-10-11: 30 mL

## 2024-10-11 MED ORDER — POVIDONE-IODINE 10 % EX SWAB: 2.0000 | Freq: Once | CUTANEOUS | Status: DC

## 2024-10-11 MED ORDER — POVIDONE-IODINE 10 % EX SWAB
2.0000 | Freq: Once | CUTANEOUS | Status: AC
  Administered 2024-10-11: 2 via TOPICAL

## 2024-10-11 MED ORDER — CEFAZOLIN SODIUM-DEXTROSE 2-4 GM/100ML-% IV SOLN
2.0000 g | Freq: Four times a day (QID) | INTRAVENOUS | Status: DC
  Administered 2024-10-11: 2 g via INTRAVENOUS

## 2024-10-11 MED ORDER — 0.9 % SODIUM CHLORIDE (POUR BTL) OPTIME
TOPICAL | Status: DC | PRN
  Administered 2024-10-11: 1000 mL

## 2024-10-11 MED ORDER — PHENYLEPHRINE HCL (PRESSORS) 10 MG/ML IV SOLN
INTRAVENOUS | Status: DC | PRN
  Administered 2024-10-11: 80 ug via INTRAVENOUS
  Administered 2024-10-11: 160 ug via INTRAVENOUS

## 2024-10-11 MED ORDER — TRANEXAMIC ACID-NACL 1000-0.7 MG/100ML-% IV SOLN
1000.0000 mg | INTRAVENOUS | Status: AC
  Administered 2024-10-11: 1000 mg via INTRAVENOUS
  Filled 2024-10-11: qty 100

## 2024-10-11 MED ORDER — MIDAZOLAM HCL 2 MG/2ML IJ SOLN
INTRAMUSCULAR | Status: AC
  Filled 2024-10-11: qty 2

## 2024-10-11 MED ORDER — OXYCODONE HCL 5 MG PO TABS
5.0000 mg | ORAL_TABLET | ORAL | 0 refills | Status: AC | PRN
  Filled 2024-10-11: qty 30, 5d supply, fill #0

## 2024-10-11 MED ORDER — BUPIVACAINE-EPINEPHRINE (PF) 0.25% -1:200000 IJ SOLN
INTRAMUSCULAR | Status: AC
  Filled 2024-10-11: qty 30

## 2024-10-11 MED ORDER — MELOXICAM 15 MG PO TABS
15.0000 mg | ORAL_TABLET | Freq: Every day | ORAL | 0 refills | Status: AC | PRN
  Filled 2024-10-11: qty 30, 30d supply, fill #0

## 2024-10-11 MED ORDER — PROPOFOL 500 MG/50ML IV EMUL
INTRAVENOUS | Status: DC | PRN
Start: 2024-10-11 — End: 2024-10-11
  Administered 2024-10-11: 75 ug/kg/min via INTRAVENOUS

## 2024-10-11 MED ORDER — FENTANYL CITRATE (PF) 100 MCG/2ML IJ SOLN
INTRAMUSCULAR | Status: DC | PRN
  Administered 2024-10-11 (×2): 50 ug via INTRAVENOUS

## 2024-10-11 MED ORDER — CEFAZOLIN SODIUM-DEXTROSE 2-4 GM/100ML-% IV SOLN
INTRAVENOUS | Status: AC
  Filled 2024-10-11: qty 100

## 2024-10-11 MED ORDER — METHOCARBAMOL 750 MG PO TABS
750.0000 mg | ORAL_TABLET | Freq: Three times a day (TID) | ORAL | 0 refills | Status: AC | PRN
  Filled 2024-10-11: qty 21, 7d supply, fill #0

## 2024-10-11 MED ORDER — ONDANSETRON 4 MG PO TBDP
4.0000 mg | ORAL_TABLET | Freq: Three times a day (TID) | ORAL | 0 refills | Status: AC | PRN
  Filled 2024-10-11: qty 15, 5d supply, fill #0

## 2024-10-11 MED ORDER — TRANEXAMIC ACID-NACL 1000-0.7 MG/100ML-% IV SOLN
1000.0000 mg | Freq: Once | INTRAVENOUS | Status: AC
  Administered 2024-10-11: 1000 mg via INTRAVENOUS

## 2024-10-11 MED ORDER — PROPOFOL 10 MG/ML IV BOLUS
INTRAVENOUS | Status: AC
  Filled 2024-10-11: qty 20

## 2024-10-11 MED ORDER — BUPIVACAINE LIPOSOME 1.3 % IJ SUSP
INTRAMUSCULAR | Status: DC | PRN
  Administered 2024-10-11: 20 mL

## 2024-10-11 MED ORDER — FENTANYL CITRATE (PF) 100 MCG/2ML IJ SOLN
INTRAMUSCULAR | Status: AC
  Filled 2024-10-11: qty 2

## 2024-10-11 MED ORDER — TRANEXAMIC ACID-NACL 1000-0.7 MG/100ML-% IV SOLN
INTRAVENOUS | Status: AC
Start: 2024-10-11 — End: 2024-10-11
  Filled 2024-10-11: qty 100

## 2024-10-11 MED ORDER — LACTATED RINGERS IV BOLUS
500.0000 mL | Freq: Once | INTRAVENOUS | Status: AC
  Administered 2024-10-11: 500 mL via INTRAVENOUS

## 2024-10-11 MED ORDER — SODIUM CHLORIDE (PF) 0.9 % IJ SOLN
INTRAMUSCULAR | Status: AC
  Filled 2024-10-11: qty 50

## 2024-10-11 MED ORDER — CHLORHEXIDINE GLUCONATE 0.12 % MT SOLN: 15.0000 mL | Freq: Once | OROMUCOSAL | Status: AC

## 2024-10-11 MED ORDER — ORAL CARE MOUTH RINSE
15.0000 mL | Freq: Once | OROMUCOSAL | Status: AC
  Administered 2024-10-11: 15 mL via OROMUCOSAL

## 2024-10-11 MED ORDER — ROPIVACAINE HCL 5 MG/ML IJ SOLN
INTRAMUSCULAR | Status: DC | PRN
  Administered 2024-10-11: 25 mL via PERINEURAL

## 2024-10-11 MED ORDER — ACETAMINOPHEN 500 MG PO TABS
1000.0000 mg | ORAL_TABLET | Freq: Once | ORAL | Status: AC
  Administered 2024-10-11: 1000 mg via ORAL
  Filled 2024-10-11: qty 2

## 2024-10-11 MED ORDER — BUPIVACAINE LIPOSOME 1.3 % IJ SUSP
INTRAMUSCULAR | Status: AC
  Filled 2024-10-11: qty 20

## 2024-10-11 MED ORDER — OXYCODONE HCL 5 MG/5ML PO SOLN: 5.0000 mg | Freq: Once | ORAL | Status: AC | PRN

## 2024-10-11 SURGICAL SUPPLY — 43 items
BAG COUNTER SPONGE SURGICOUNT (BAG) IMPLANT
BLADE SAG 18X100X1.27 (BLADE) ×1 IMPLANT
BLADE SAGITTAL 25.0X1.37X90 (BLADE) ×1 IMPLANT
BLADE SURG 15 STRL LF DISP TIS (BLADE) ×1 IMPLANT
BNDG ELASTIC 6X10 VLCR STRL LF (GAUZE/BANDAGES/DRESSINGS) ×1 IMPLANT
BOWL SMART MIX CTS (DISPOSABLE) IMPLANT
CLSR STERI-STRIP ANTIMIC 1/2X4 (GAUZE/BANDAGES/DRESSINGS) ×2 IMPLANT
COMPONENT TRI CR FEM SZ5 KNEE (Orthopedic Implant) IMPLANT
COVER SURGICAL LIGHT HANDLE (MISCELLANEOUS) ×1 IMPLANT
CUFF TRNQT CYL 34X4.125X (TOURNIQUET CUFF) ×1 IMPLANT
DRAPE U-SHAPE 47X51 STRL (DRAPES) ×1 IMPLANT
DRSG MEPILEX POST OP 4X12 (GAUZE/BANDAGES/DRESSINGS) ×1 IMPLANT
DURAPREP 26ML APPLICATOR (WOUND CARE) ×2 IMPLANT
ELECT PENCIL ROCKER SW 15FT (MISCELLANEOUS) ×1 IMPLANT
ELECT REM PT RETURN 15FT ADLT (MISCELLANEOUS) ×1 IMPLANT
GLOVE BIO SURGEON STRL SZ7.5 (GLOVE) ×1 IMPLANT
GLOVE BIOGEL PI IND STRL 7.5 (GLOVE) ×1 IMPLANT
GLOVE BIOGEL PI IND STRL 8 (GLOVE) ×1 IMPLANT
GLOVE SURG SYN 7.0 PF PI (GLOVE) ×1 IMPLANT
GOWN STRL REUS W/ TWL LRG LVL3 (GOWN DISPOSABLE) ×1 IMPLANT
GOWN STRL REUS W/ TWL XL LVL3 (GOWN DISPOSABLE) ×1 IMPLANT
HOLDER FOLEY CATH W/STRAP (MISCELLANEOUS) IMPLANT
IMMOBILIZER KNEE 22 UNIV (SOFTGOODS) IMPLANT
INSERT TIB BEARING X3 SZ4 11 (Joint) IMPLANT
KIT TURNOVER KIT A (KITS) ×1 IMPLANT
KNEE PATELLA ASYMMETRIC 9X29 (Knees) IMPLANT
KNEE TIBIAL COMP TRI SZ4 (Knees) IMPLANT
MANIFOLD NEPTUNE II (INSTRUMENTS) ×1 IMPLANT
NS IRRIG 1000ML POUR BTL (IV SOLUTION) ×1 IMPLANT
PACK TOTAL KNEE CUSTOM (KITS) ×1 IMPLANT
PIN FLUTED HEDLESS FIX 3.5X1/8 (PIN) IMPLANT
PROTECTOR NERVE ULNAR (MISCELLANEOUS) ×1 IMPLANT
SET HNDPC FAN SPRY TIP SCT (DISPOSABLE) ×1 IMPLANT
SPIKE FLUID TRANSFER (MISCELLANEOUS) ×1 IMPLANT
STRIP CLOSURE SKIN 1/2X4 (GAUZE/BANDAGES/DRESSINGS) IMPLANT
SUT MNCRL AB 3-0 PS2 18 (SUTURE) ×1 IMPLANT
SUT VIC AB 0 CT1 36 (SUTURE) ×2 IMPLANT
SUT VIC AB 1 CT1 36 (SUTURE) ×1 IMPLANT
SUT VIC AB 2-0 CT1 TAPERPNT 27 (SUTURE) ×1 IMPLANT
TOWEL GREEN STERILE FF (TOWEL DISPOSABLE) ×1 IMPLANT
TRAY FOLEY MTR SLVR 16FR STAT (SET/KITS/TRAYS/PACK) IMPLANT
TUBE SUCTION HIGH CAP CLEAR NV (SUCTIONS) ×1 IMPLANT
WRAP KNEE MAXI GEL POST OP (GAUZE/BANDAGES/DRESSINGS) ×1 IMPLANT

## 2024-10-11 NOTE — Anesthesia Procedure Notes (Addendum)
 Anesthesia Regional Block: Adductor canal block   Pre-Anesthetic Checklist: , timeout performed,  Correct Patient, Correct Site, Correct Laterality,  Correct Procedure, Correct Position, site marked,  Risks and benefits discussed,  Surgical consent,  Pre-op evaluation,  At surgeon's request and post-op pain management  Laterality: Right  Prep: chloraprep       Needles:  Injection technique: Single-shot  Needle Type: Echogenic Needle     Needle Length: 9cm  Needle Gauge: 21     Additional Needles:   Narrative:  Start time: 10/11/2024 7:02 AM End time: 10/11/2024 7:12 AM Injection made incrementally with aspirations every 5 mL.  Performed by: Personally  Anesthesiologist: Maryclare Cornet, MD  Additional Notes: Pt tolerated the procedure well.

## 2024-10-11 NOTE — Anesthesia Procedure Notes (Signed)
 Spinal  Patient location during procedure: OR Start time: 10/11/2024 7:29 AM End time: 10/11/2024 7:33 AM Reason for block: surgical anesthesia Staffing Performed: anesthesiologist  Anesthesiologist: Maryclare Cornet, MD Performed by: Maryclare Cornet, MD Authorized by: Maryclare Cornet, MD   Preanesthetic Checklist Completed: patient identified, IV checked, risks and benefits discussed, surgical consent, monitors and equipment checked, pre-op evaluation and timeout performed Spinal Block Patient position: sitting Prep: DuraPrep Patient monitoring: cardiac monitor, continuous pulse ox and blood pressure Approach: midline Location: L3-4 Injection technique: single-shot Needle Needle type: Pencan  Needle gauge: 24 G Needle length: 9 cm Assessment Sensory level: T10 Events: CSF return Additional Notes Functioning IV was confirmed and monitors were applied. Sterile prep and drape, including hand hygiene and sterile gloves were used. The patient was positioned and the spine was prepped. The skin was anesthetized with lidocaine .  Free flow of clear CSF was obtained prior to injecting local anesthetic into the CSF.  The spinal needle aspirated freely following injection.  The needle was carefully withdrawn.  The patient tolerated the procedure well.

## 2024-10-11 NOTE — Progress Notes (Signed)
 Physical Therapy Treatment Patient Details Name: [name] [name] MRN: [id number] DOB: [birth year] Today's Date: 10/11/2024   History of Present Illness 63 y.o. female admitted 10/11/24 for R TKA. PMH: CHF, HTN, obesity, OA, RA, sleep apnea, gastric sleeve resection, L TKA 12/29/23.    PT Comments  Pt not able to do R SLR and has weak quadriceps contraction on R so knee immobilizer was donned for ambulation. Pt ambulated 150' with RW and R KI, no loss of balance. Stair training completed. Pt demonstrates good understanding of HEP. Instructed to wear R KI when walking until RLE stops buckling and to consult with her HHPT about when to DC the KI. She is ready to DC home from a PT standpoint.    If plan is discharge home, recommend the following: A lot of help with walking and/or transfers;A lot of help with bathing/dressing/bathroom;Assistance with cooking/housework;Assist for transportation;Help with stairs or ramp for entrance   Can travel by private vehicle        Equipment Recommendations  None recommended by PT    Recommendations for Other Services       Precautions / Restrictions Precautions Precautions: Knee Precaution Booklet Issued: Yes (comment) Recall of Precautions/Restrictions: Intact Precaution/Restrictions Comments: reviewed no pillow under knee Restrictions Weight Bearing Restrictions Per Provider Order: No Other Position/Activity Restrictions: WBAT     Mobility  Bed Mobility Overal bed mobility: Modified Independent Bed Mobility: Supine to Sit     Supine to sit: HOB elevated, Modified independent (Device/Increase time) Sit to supine: Min assist   General bed mobility comments: gait belt used for RLE lifter    Transfers Overall transfer level: Needs assistance Equipment used: Rolling walker (2 wheels) Transfers: Sit to/from Stand Sit to Stand: Contact guard assist           General transfer comment: VCs hand placement, RLE buckled with  weight shifting    Ambulation/Gait Ambulation/Gait assistance: Contact guard assist Gait Distance (Feet): 150 Feet Assistive device: Rolling walker (2 wheels) Gait Pattern/deviations: Step-to pattern Gait velocity: WFL     General Gait Details: KI donned (pt cannot perform SLR, weak quad set), VCs sequencing, no loss of balance, no buckling   Stairs Stairs: Yes Stairs assistance: Contact guard assist Stair Management: Two rails, Step to pattern, Forwards Number of Stairs: 3 General stair comments: VCs sequencing   Wheelchair Mobility     Tilt Bed    Modified Rankin (Stroke Patients Only)       Balance Overall balance assessment: Needs assistance Sitting-balance support: Feet supported, No upper extremity supported Sitting balance-Leahy Scale: Good     Standing balance support: Bilateral upper extremity supported, During functional activity, Reliant on assistive device for balance Standing balance-Leahy Scale: Fair                              Hotel Manager: No apparent difficulties  Cognition Arousal: Alert Behavior During Therapy: WFL for tasks assessed/performed   PT - Cognitive impairments: No apparent impairments                         Following commands: Intact      Cueing    Exercises Total Joint Exercises  Long Arc Quad: AAROM, Right, 5 reps, Seated, Limitations Long Arc Quad Limitations: trace quad contraction Knee Flexion: AAROM, Right, 10 reps, Seated Goniometric ROM: 0-75* R knee AAROM    General Comments  Pertinent Vitals/Pain Pain Assessment Pain Assessment: No/denies pain    Home Living Family/patient expects to be discharged to:: Private residence Living Arrangements: Spouse/significant other Available Help at Discharge: Family;Available 24 hours/day Type of Home: House Home Access: Stairs to enter Entrance Stairs-Rails: Can reach both;Left;Right Entrance Stairs-Number  of Steps: 5   Home Layout: One level Home Equipment: Agricultural Consultant (2 wheels);Cane - single point      Prior Function            PT Goals (current goals can now be found in the care plan section) Acute Rehab PT Goals Patient Stated Goal[place], do stairs PT Goal Formulation: With patient Time For Goal Achievement: 10/18/24 Potential to Achieve Goals: Good Progress towards PT goals: Goals met/education completed, patient discharged from PT    Frequency    7X/week      PT Plan      Co-evaluation              AM-PAC PT 6 Clicks Mobility   Outcome Measure  Help needed turning from your back to your side while in a flat bed without using bedrails?: None Help needed moving from lying on your back to sitting on the side of a flat bed without using bedrails?: None Help needed moving to and from a bed to a chair (including a wheelchair)?: None Help needed standing up from a chair using your arms (e.g., wheelchair or bedside chair)?: None Help needed to walk in hospital room?: None Help needed climbing 3-5 steps with a railing? : A Little 6 Click Score: 23    End of Session Equipment Utilized During Treatment: Gait belt Activity Tolerance: Patient tolerated treatment well (spinal not fully worn off) Patient left: with call bell/phone within reach;with family/visitor present;in chair Nurse Communication: Mobility status PT Visit Diagnosis: Difficulty in walking, not elsewhere classified (R26.2);Muscle weakness (generalized) (M62.81)     Time: 8458-8387 PT Time Calculation (min) (ACUTE ONLY): 31 min  Charges:    $Gait Training: 8-22 mins  $Therapeutic Activity: 8-22 mins PT General Charges $$ ACUTE PT VISIT: 1 Visit                     Sylvan Delon Copp PT 10/11/2024  Acute Rehabilitation Services  Office [phone]

## 2024-10-11 NOTE — Op Note (Signed)
 DATE OF SURGERY:  10/11/2024 TIME: 8:43 AM  PATIENT NAME:  [name] [name]   AGE: 63 y.o.    PRE-OPERATIVE DIAGNOSIS:  OA RIGHT KNEE  POST-OPERATIVE DIAGNOSIS:  Same  PROCEDURE:  Procedure(s): ARTHROPLASTY, KNEE, TOTAL   SURGEON:  [name], MD   ASSISTANT:  Gerard Large, PA-C, she was present and scrubbed throughout the case, critical for completion in a timely fashion, and for retraction, instrumentation, and closure.    OPERATIVE IMPLANTS: [employer] Triathlon CR. Press fit knee  Femur size 5, Tibia size 4, Patella size 29 3-peg oval button, with a 11 mm polyethylene insert.   PREOPERATIVE INDICATIONS:  [name] is a 63 y.o. year old female with end stage bone on bone degenerative arthritis of the knee who failed conservative treatment, including injections, antiinflammatories, activity modification, and assistive devices, and had significant impairment of their activities of daily living, and elected for Total Knee Arthroplasty.   The risks, benefits, and alternatives were discussed at length including but not limited to the risks of infection, bleeding, nerve injury, stiffness, blood clots, the need for revision surgery, cardiopulmonary complications, among others, and they were willing to proceed.   OPERATIVE DESCRIPTION:  The patient was brought to the operative room and placed in a supine position.  General anesthesia was administered.  IV antibiotics were given.  The lower extremity was prepped and draped in the usual sterile fashion.  Time out was performed.  The leg was elevated and exsanguinated and the tourniquet was inflated.  Anterior approach was performed.  The patella was everted and osteophytes were removed.  The anterior horn of the medial and lateral meniscus was removed.   The distal femur was opened with the drill and the intramedullary distal femoral cutting jig was utilized, set at 5 degrees resecting 9 mm off the  distal femur.  Care was taken to protect the collateral ligaments.  The distal femoral sizing jig was applied, taking care to avoid notching.  Then the 4-in-1 cutting jig was applied and the anterior and posterior femur was cut, along with the chamfer cuts.  All posterior osteophytes were removed.  The flexion gap was then measured and was symmetric with the extension gap.  Then the extramedullary tibial cutting jig was utilized making the appropriate cut using the anterior tibial crest as a reference building in appropriate posterior slope.  Care was taken during the cut to protect the medial and collateral ligaments.  The proximal tibia was removed along with the posterior horns of the menisci.    I completed the distal femoral preparation using the appropriate jig to prepare the box.  The patella was then measured, and cut with the saw.    The proximal tibia sized and prepared accordingly with the reamer and the punch, and then all components were trialed with the above sized poly insert.  The knee was found to have excellent balance and full motion.    The above named components were then impacted into place and Poly tibial piece and patella were inserted.  I was very happy with his stability and ROM  I performed a periarticular injection with Exparel   The knee was easily taken through a range of motion and the patella tracked well and the knee irrigated copiously and the parapatellar and subcutaneous tissue closed with vicryl, and monocryl with steri strips for the skin.  The incision was dressed with sterile gauze and the tourniquet released and the patient was awakened and returned  to the PACU in stable and satisfactory condition.  There were no complications.  Total tourniquet time was roughly 45 minutes.   POSTOPERATIVE PLAN: post op Abx, DVT px: SCD's, TED's, Early ambulation and chemical px

## 2024-10-11 NOTE — Interval H&P Note (Signed)
 History and Physical Interval Note:  10/11/2024 7:11 AM  [name] [name]  has presented today for surgery, with the diagnosis of OA RIGHT KNEE.  The various methods of treatment have been discussed with the patient and family. After consideration of risks, benefits and other options for treatment, the patient has consented to  Procedure(s): ARTHROPLASTY, KNEE, TOTAL (Right) as a surgical intervention.  The patient's history has been reviewed, patient examined, no change in status, stable for surgery.  I have reviewed the patient's chart and labs.  Questions were answered to the patient's satisfaction.     [name]

## 2024-10-11 NOTE — Progress Notes (Signed)
 Physical Therapy Treatment Patient Details Name: [name] [name] MRN: [id number] DOB: [birth year] Today's Date: 10/11/2024   History of Present Illness 63 y.o. female admitted 10/11/24 for R TKA. PMH: CHF, HTN, obesity, OA, RA, sleep apnea, gastric sleeve resection, L TKA 12/29/23.    PT Comments  Pt reports some numbness in buttocks. She is not able to perform a straight leg raise, nor perform active knee extension, suspect spinal is still not fully worn off. Pt stood at edge of bed with R knee immobilizer on and took 3 side steps with RW, RLE buckled with weight bearing. Assisted pt back to bed. Instructed pt/spouse in TKA HEP. She is not yet able to safely ambulate. Will follow.     If plan is discharge home, recommend the following: A lot of help with walking and/or transfers;A lot of help with bathing/dressing/bathroom;Assistance with cooking/housework;Assist for transportation;Help with stairs or ramp for entrance   Can travel by private vehicle        Equipment Recommendations  None recommended by PT    Recommendations for Other Services       Precautions / Restrictions Precautions Precautions: Knee Precaution Booklet Issued: Yes (comment) Recall of Precautions/Restrictions: Intact Precaution/Restrictions Comments: reviewed no pillow under knee Restrictions Weight Bearing Restrictions Per Provider Order: No Other Position/Activity Restrictions: WBAT     Mobility  Bed Mobility Overal bed mobility: Needs Assistance Bed Mobility: Supine to Sit, Sit to Supine     Supine to sit: Supervision, HOB elevated Sit to supine: Min assist   General bed mobility comments: min A for RLE into bed    Transfers Overall transfer level: Needs assistance Equipment used: Rolling walker (2 wheels) Transfers: Sit to/from Stand Sit to Stand: Contact guard assist           General transfer comment: VCs hand placement, RLE buckled with weight shifting     Ambulation/Gait Ambulation/Gait assistance: Contact guard assist Gait Distance (Feet): 2 Feet Assistive device: Rolling walker (2 wheels) Gait Pattern/deviations: Step-to pattern       General Gait Details: side stepping at edge of bed, RLE buckled with weight shifting, R KI on   Stairs             Wheelchair Mobility     Tilt Bed    Modified Rankin (Stroke Patients Only)       Balance Overall balance assessment: Needs assistance Sitting-balance support: Feet supported, No upper extremity supported Sitting balance-Leahy Scale: Good     Standing balance support: Bilateral upper extremity supported, During functional activity, Reliant on assistive device for balance Standing balance-Leahy Scale: Poor                              Communication Communication Communication: No apparent difficulties  Cognition Arousal: Alert Behavior During Therapy: WFL for tasks assessed/performed   PT - Cognitive impairments: No apparent impairments                         Following commands: Intact      Cueing    Exercises Total Joint Exercises Ankle Circles/Pumps: AROM, Both, 10 reps, Supine Quad Sets: AROM, Both, 5 reps, Supine Short Arc Quad: AAROM, Right, 5 reps, Supine Heel Slides: AAROM, Right, 5 reps, Supine Hip ABduction/ADduction: AAROM, Right, 5 reps, Supine Straight Leg Raises: Right, AAROM, 5 reps, Supine    General Comments        Pertinent Vitals/Pain  Pain Assessment Pain Assessment: No/denies pain    Home Living Family/patient expects to be discharged to:: Private residence Living Arrangements: Spouse/significant other Available Help at Discharge: Family;Available 24 hours/day Type of Home: House Home Access: Stairs to enter Entrance Stairs-Rails: Can reach both;Left;Right Entrance Stairs-Number of Steps: 5   Home Layout: One level Home Equipment: Agricultural Consultant (2 wheels);Cane - single point      Prior Function             PT Goals (current goals can now be found in the care plan section) Acute Rehab PT Goals Patient Stated Goal: [place], do stairs PT Goal Formulation: With patient Time For Goal Achievement: 10/18/24 Potential to Achieve Goals: Good Progress towards PT goals: Progressing toward goals    Frequency    7X/week      PT Plan      Co-evaluation              AM-PAC PT 6 Clicks Mobility   Outcome Measure  Help needed turning from your back to your side while in a flat bed without using bedrails?: A Little Help needed moving from lying on your back to sitting on the side of a flat bed without using bedrails?: A Little Help needed moving to and from a bed to a chair (including a wheelchair)?: A Lot Help needed standing up from a chair using your arms (e.g., wheelchair or bedside chair)?: A Little Help needed to walk in hospital room?: A Lot Help needed climbing 3-5 steps with a railing? : Total 6 Click Score: 14    End of Session Equipment Utilized During Treatment: Gait belt Activity Tolerance: Patient tolerated treatment well;Treatment limited secondary to medical complications (Comment) (spinal not fully worn off) Patient left: in bed;with call bell/phone within reach;with family/visitor present Nurse Communication: Mobility status PT Visit Diagnosis: Difficulty in walking, not elsewhere classified (R26.2);Muscle weakness (generalized) (M62.81)     Time: 8588-8561 PT Time Calculation (min) (ACUTE ONLY): 27 min  Charges:    $Therapeutic Exercise: 8-22 mins $Therapeutic Activity: 8-22 mins PT General Charges $$ ACUTE PT VISIT: 1 Visit                     Sylvan Delon Copp PT 10/11/2024  Acute Rehabilitation Services  Office [phone]

## 2024-10-11 NOTE — H&P (Signed)
 ORTHOPAEDIC CONSULTATION  REQUESTING PHYSICIAN: Beverley Evalene BIRCH, MD   Chief Complaint: Severe OA right knee  HPI: [name] [name] is a 63 y.o. female who complains of pain about the right knee  Past Medical History:  Diagnosis Date   Asthma    CHF (congestive heart failure) (HCC)    Hypertension    Joint pain    Lumbar herniated disc    OA (osteoarthritis)    Obesity    Pre-diabetes    Rheumatoid arthritis (HCC)    Sleep apnea    CPAP   SOB (shortness of breath)    Past Surgical History:  Procedure Laterality Date   BREAST SURGERY     breast reduction    LAPAROSCOPIC GASTRIC SLEEVE RESECTION N/A 09/29/2016   Procedure: LAPAROSCOPIC GASTRIC SLEEVE RESECTION, UPPER ENDO;  Surgeon: Camellia Blush, MD;  Location: [facility];  Service: General;  Laterality: N/A;   REDUCTION MAMMAPLASTY Bilateral 2003   removed bone from left knee      TOTAL KNEE ARTHROPLASTY Left 12/29/2023   Procedure: LEFT TOTAL KNEE ARTHROPLASTY;  Surgeon: Beverley Evalene BIRCH, MD;  Location: [facility];  Service: Orthopedics;  Laterality: Left;   UPPER GI ENDOSCOPY  09/29/2016   Procedure: UPPER GI ENDOSCOPY;  Surgeon: Camellia Blush, MD;  Location: [facility];  Service: General;;   Social History   Socioeconomic History   Marital status: Married    Spouse name: Not on file   Number of children: Not on file   Years of education: Not on file   Highest education level: Not on file  Occupational History   Occupation: Customer Service  Tobacco Use   Smoking status: Never   Smokeless tobacco: Never  Vaping Use   Vaping status: Never Used  Substance and Sexual Activity   Alcohol use: No    Alcohol/week: 0.0 standard drinks of alcohol   Drug use: No   Sexual activity: Yes    Birth control/protection: None  Other Topics Concern   Not on file  Social History Narrative   Not on file   Social Drivers of Health   Financial Resource Strain: Not on file  Food Insecurity: No Food Insecurity  (10/06/2017)   Hunger Vital Sign    Worried About Running Out of Food in the Last Year: Never true    Ran Out of Food in the Last Year: Never true  Transportation Needs: Not on file  Physical Activity: Not on file  Stress: Not on file  Social Connections: Not on file   Family History  Problem Relation Age of Onset   Ovarian cancer Mother 71       Not sure of this diagnosis   Obesity Mother    Diabetes Brother    Hypertension Brother    Breast cancer Neg Hx    Allergies  Allergen Reactions   Penicillins Anaphylaxis, Hives, Shortness Of Breath, Nausea And Vomiting, Itching and Other (See Comments)    Has patient had a PCN reaction causing immediate rash, facial/tongue/throat swelling, SOB or lightheadedness with hypotension: yes  Has patient had a PCN reaction causing severe rash involving mucus membranes or skin necrosis: yes  Has patient had a PCN reaction that required hospitalization no  Has patient had a PCN reaction occurring within the last 10 years: yes  If all of the above answers are NO, then may proceed with Cephalosporin use.  Upset stomach  Has patient had a PCN reaction causing immediate rash, facial/tongue/throat swelling,  SOB or lightheadedness with hypotension: yes  Has patient had a PCN reaction causing severe rash involving mucus membranes or skin necrosis: yes  Has patient had a PCN reaction that required hospitalization no  Has patient had a PCN reaction occurring within the last 10 years: yes  If all of the above answers are NO, then may proceed with Cephalosporin use.  Other reaction(s): Shortness Of Breath  Has patient had a PCN reaction causing immediate rash, facial/tongue/throat swelling, SOB or lightheadedness with hypotension: yes  Has patient had a PCN reaction causing severe rash involving mucus membranes or skin necrosis: yes  Has patient had a PCN reaction that required hospitalization no  Has patient had a PCN reaction occurring within the  last 10 years: yes  If all of the above answers are NO, then may proceed with Cephalosporin use.  Upset stomach  Other reaction(s): Shortness Of Breath, Has patient had a PCN reaction causing immediate rash, facial/tongue/throat swelling, SOB or lightheadedness with hypotension: yes, Has patient had a PCN reaction causing severe rash involving mucus membranes or skin necrosis: yes, Has patient had a PCN reaction that required hospitalization no, Has patient had a PCN reaction occurring within the last 10 years: yes, If all of the above answers are NO, then may proceed with Cephalosporin use.  Upset stomach   Amoxicillin  Other (See Comments)    Upset stomach   Tetanus-Diphth-Acell Pertussis Rash and Other (See Comments)    Headache /dehydration/rash/itching   Celecoxib  Other (See Comments)   Tdap [Tetanus-Diphth-Acell Pertussis] Other (See Comments)    Headache /dehydration   Prior to Admission medications   Medication Sig Start Date End Date Taking? Authorizing Provider  acetaminophen  (TYLENOL ) 650 MG CR tablet Take 650 mg by mouth every 8 (eight) hours as needed for pain.   Yes [provider]  atorvastatin  (LIPITOR) 10 MG tablet Take 10 mg by mouth daily.   Yes [provider]  carvedilol  (COREG ) 6.25 MG tablet Take 6.25 mg by mouth 2 (two) times daily with a meal.   Yes [provider]  furosemide  (LASIX ) 20 MG tablet Take 20 mg by mouth daily.   Yes [provider]  mupirocin  ointment (BACTROBAN ) 2 % Apply to affected area 2 times a day as needed for 7 days Patient taking differently: Apply 1 Application topically daily as needed (irritation). 12/21/23  Yes   potassium chloride  (KLOR-CON  M) 10 MEQ tablet Take 10 mEq by mouth daily. 08/18/23  Yes [provider]  tirzepatide  (ZEPBOUND ) 10 MG/0.5ML Pen Inject 10 mg into the skin once a week. 05/30/24  Yes   valACYclovir  (VALTREX ) 500 MG tablet TAKE 1 TABLET BY MOUTH EVERY DAY Patient taking  differently: Take 500 mg by mouth daily as needed (Cold sores). 06/14/15  Yes Le, Thao P, DO  Accu-Chek Softclix Lancets lancets Check glucose once daily and as needed. 08/11/24     acetaminophen  (TYLENOL ) 500 MG tablet Take 2 tablets (1,000 mg total) by mouth every 6 (six) hours as needed for mild pain (pain score 1-3) or moderate pain (pain score 4-6). Patient not taking: Reported on 09/26/2024 12/29/23   Gawne, Meghan M, PA-C  albuterol  (PROVENTIL ) (2.5 MG/3ML) 0.083% nebulizer solution Use 1 vial (3 mLs) by nebulization every 4 hours as needed for 10 days. Patient not taking: Reported on 09/26/2024 03/10/22     albuterol  (VENTOLIN  HFA) 108 (90 Base) MCG/ACT inhaler Inhale 2 puffs into the lungs every 4 (four) hours as needed. Patient not taking: Reported  on 09/26/2024 03/10/22     allopurinol  (ZYLOPRIM ) 100 MG tablet Take 0.5 tablets (50 mg total) by mouth daily. Patient not taking: Reported on 09/26/2024 10/14/23     aspirin  EC 81 MG tablet Take 1 tablet (81 mg total) by mouth 2 (two) times daily. To prevent blood clots for 30 days after surgery. Patient not taking: Reported on 09/26/2024 12/29/23   Gawne, Meghan M, PA-C  atorvastatin  (LIPITOR) 40 MG tablet Take 1 tablet (40 mg total) by mouth daily. Patient not taking: Reported on 09/26/2024 09/29/23   Meng, Hao, PA  Blood Glucose Monitoring Suppl (ACCU-CHEK GUIDE) w/Device KIT Check glucose once daily and as needed. 08/11/24     cyclobenzaprine  (FLEXERIL ) 10 MG tablet Take 1 tablet (10 mg total) by mouth 3 (three) times daily as needed. Patient not taking: Reported on 09/26/2024 08/10/24     diclofenac  (VOLTAREN ) 75 MG EC tablet Take 1 tablet (75 mg total) by mouth 2 (two) times daily as needed for mild pain (pain score 1-3) or moderate pain (pain score 4-6) (and swelling). Patient not taking: Reported on 09/26/2024 12/29/23   Gawne, Meghan M, PA-C  docusate sodium  (COLACE) 100 MG capsule Take 1 capsule (100 mg total) by mouth 2 (two) times daily as needed for  mild constipation or moderate constipation. Patient not taking: Reported on 09/26/2024 12/29/23 12/28/24  Gawne, Meghan M, PA-C  glucose blood (ACCU-CHEK GUIDE TEST) test strip Check glucose once daily and as needed 08/11/24     methocarbamol  (ROBAXIN -750) 750 MG tablet Take 1 tablet (750 mg total) by mouth every 8 (eight) hours as needed for muscle spasms. Patient not taking: Reported on 09/26/2024 12/29/23   Gawne, Meghan M, PA-C  ondansetron  (ZOFRAN -ODT) 4 MG disintegrating tablet Take 1 tablet (4 mg total) by mouth every 8 (eight) hours as needed for nausea or vomiting. Patient not taking: Reported on 09/26/2024 12/29/23   Gawne, Meghan M, PA-C  oxyCODONE  (ROXICODONE ) 5 MG immediate release tablet Take 1 tablet (5 mg total) by mouth every 4 (four) hours as needed for severe pain (pain score 7-10). Patient not taking: Reported on 09/26/2024 12/29/23   Gawne, Meghan M, PA-C  predniSONE  (DELTASONE ) 20 MG tablet Take 1 tablet (20 mg total) by mouth daily with food or milk. Patient not taking: Reported on 09/26/2024 08/10/24     tirzepatide  (MOUNJARO ) 10 MG/0.5ML Pen Inject 10 mg into the skin once a week. Patient not taking: Reported on 09/26/2024 11/23/23     tirzepatide  (ZEPBOUND ) 10 MG/0.5ML Pen Inject 10 mg into the skin once a week. 12/03/23     tirzepatide  (ZEPBOUND ) 12.5 MG/0.5ML Pen Inject 12.5 mg into the skin once a week. 08/31/24     tirzepatide  (ZEPBOUND ) 5 MG/0.5ML Pen Inject 5 mg into the skin once a week. 02/23/24     tirzepatide  (ZEPBOUND ) 7.5 MG/0.5ML Pen Inject 7.5 mg into the skin once a week. Patient not taking: Reported on 09/26/2024 01/28/24      No results found.  Positive ROS: All other systems have been reviewed and were otherwise negative with the exception of those mentioned in the HPI and as above.  Labs cbc No results for input(s): WBC, HGB, HCT, PLT in the last 72 hours.  Labs inflam No results for input(s): CRP in the last 72 hours.  Invalid input(s): ESR  Labs  coag No results for input(s): INR, PTT in the last 72 hours.  Invalid input(s): PT  No results for input(s): NA, K, CL, CO2, GLUCOSE, BUN,  CREATININE, CALCIUM  in the last 72 hours.  Physical Exam: Vitals:   10/11/24 0608  BP: 128/84  Pulse: 70  Resp: 16  Temp: 97.8 F (36.6 C)  SpO2: 99%   General: Alert, no acute distress Cardiovascular: No pedal edema Respiratory: No cyanosis, no use of accessory musculature GI: No organomegaly, abdomen is soft and non-tender Skin: No lesions in the area of chief complaint other than those listed below in MSK exam.  Neurologic: Sensation intact distally save for the below mentioned MSK exam Psychiatric: Patient is competent for consent with normal mood and affect Lymphatic: No axillary or cervical lymphadenopathy  MUSCULOSKELETAL:  TTP at R knee Other extremities are atraumatic with painless ROM and NVI.  Assessment: R knee OA  Plan: TKA today   [name], MD    10/11/2024 7:10 AM

## 2024-10-11 NOTE — Anesthesia Postprocedure Evaluation (Signed)
 Anesthesia Post Note  Patient: Ayesha Moscita Gwenn Barrio  Procedure(s) Performed: ARTHROPLASTY, KNEE, TOTAL (Right: Knee)     Patient location during evaluation: PACU Anesthesia Type: MAC, Spinal and Regional Level of consciousness: oriented and awake and alert Pain management: pain level controlled Vital Signs Assessment: post-procedure vital signs reviewed and stable Respiratory status: spontaneous breathing, respiratory function stable and patient connected to nasal cannula oxygen  Cardiovascular status: blood pressure returned to baseline and stable Postop Assessment: no headache, no backache and no apparent nausea or vomiting Anesthetic complications: no   No notable events documented.  Last Vitals:  Vitals:   10/11/24 1115 10/11/24 1130  BP: (!) 117/95 127/78  Pulse: 60 75  Resp: (!) 21 16  Temp:  36.5 C  SpO2: 100% 98%    Last Pain:  Vitals:   10/11/24 1130  TempSrc:   PainSc: 0-No pain                 Pamala Hayman S

## 2024-10-11 NOTE — Transfer of Care (Signed)
 Immediate Anesthesia Transfer of Care Note  Patient: [name] [name]  Procedure(s) Performed: ARTHROPLASTY, KNEE, TOTAL (Right: Knee)  Patient Location: PACU  Anesthesia Type:Spinal  Level of Consciousness: awake, alert , and oriented  Airway & Oxygen  Therapy: Patient Spontanous Breathing and Patient connected to face mask oxygen   Post-op Assessment: Report given to RN and Post -op Vital signs reviewed and stable  Post vital signs: Reviewed and stable  Last Vitals:  Vitals Value Taken Time  BP 105/69 10/11/24 09:31  Temp    Pulse 80 10/11/24 09:32  Resp    SpO2 100 % 10/11/24 09:32  Vitals shown include unfiled device data.  Last Pain:  Vitals:   10/11/24 0643  TempSrc:   PainSc: 8          Complications: No notable events documented.

## 2024-10-11 NOTE — Evaluation (Signed)
 Physical Therapy Evaluation Patient Details Name: [name] [name] MRN: [id number] DOB: September 03, 1961 Today's Date: 10/11/2024  History of Present Illness  63 y.o. female admitted 10/11/24 for R TKA. PMH: CHF, HTN, obesity, OA, RA, sleep apnea, gastric sleeve resection, L TKA 12/29/23.  Clinical Impression  Pt is s/p TKA resulting in the deficits listed below (see PT Problem List). Min assist for supine to sit and for sit to stand. Pt stood at edge of bed with RW, however had buckling of RLE with weight shifting, pt also reports tingling in RLE. Spinal anesthesia likely not fully worn off. Will return later today to attempt ambulation again. Pt will benefit from acute skilled PT to increase their independence and safety with mobility to allow discharge.          If plan is discharge home, recommend the following: A lot of help with walking and/or transfers;A lot of help with bathing/dressing/bathroom;Assistance with cooking/housework;Assist for transportation;Help with stairs or ramp for entrance   Can travel by private vehicle        Equipment Recommendations None recommended by PT  Recommendations for Other Services       Functional Status Assessment Patient has had a recent decline in their functional status and demonstrates the ability to make significant improvements in function in a reasonable and predictable amount of time.     Precautions / Restrictions Precautions Precautions: Knee Precaution Booklet Issued: Yes (comment) Recall of Precautions/Restrictions: Intact Precaution/Restrictions Comments: reviewed no pillow under knee Restrictions Weight Bearing Restrictions Per Provider Order: No Other Position/Activity Restrictions: WBAT      Mobility  Bed Mobility Overal bed mobility: Needs Assistance Bed Mobility: Supine to Sit     Supine to sit: Min assist     General bed mobility comments: assist to advance RLE and pivot hips to EOB     Transfers Overall transfer level: Needs assistance Equipment used: Rolling walker (2 wheels) Transfers: Sit to/from Stand Sit to Stand: Min assist           General transfer comment: VCs hand placement, min A to power up, RLE buckled with weight shifting    Ambulation/Gait                  Stairs            Wheelchair Mobility     Tilt Bed    Modified Rankin (Stroke Patients Only)       Balance Overall balance assessment: Needs assistance Sitting-balance support: Feet supported, No upper extremity supported Sitting balance-Leahy Scale: Good     Standing balance support: Bilateral upper extremity supported, During functional activity, Reliant on assistive device for balance Standing balance-Leahy Scale: Poor                               Pertinent Vitals/Pain Pain Assessment Pain Assessment: No/denies pain    Home Living Family/patient expects to be discharged to:: Private residence Living Arrangements: Spouse/significant other Available Help at Discharge: Family;Available 24 hours/day Type of Home: House Home Access: Stairs to enter Entrance Stairs-Rails: Can reach both;Left;Right Entrance Stairs-Number of Steps: 5   Home Layout: One level Home Equipment: Agricultural Consultant (2 wheels);Cane - single point      Prior Function Prior Level of Function : Independent/Modified Independent;Driving             Mobility Comments: used SPC prior to admission ADLs Comments: independent     Extremity/Trunk Assessment  Upper Extremity Assessment Upper Extremity Assessment: Overall WFL for tasks assessed    Lower Extremity Assessment Lower Extremity Assessment: RLE deficits/detail RLE Deficits / Details: SLR -2/5, knee ext -2/5 RLE Sensation: decreased light touch RLE Coordination: decreased gross motor;decreased fine motor    Cervical / Trunk Assessment Cervical / Trunk Assessment: Normal  Communication    Communication Communication: No apparent difficulties    Cognition Arousal: Alert Behavior During Therapy: WFL for tasks assessed/performed   PT - Cognitive impairments: No apparent impairments                         Following commands: Intact       Cueing       General Comments      Exercises Total Joint Exercises Ankle Circles/Pumps: AROM, Both, 10 reps, Supine   Assessment/Plan    PT Assessment Patient needs continued PT services  PT Problem List Decreased strength;Decreased mobility;Decreased activity tolerance       PT Treatment Interventions Gait training;Therapeutic exercise;Therapeutic activities;Balance training    PT Goals (Current goals can be found in the Care Plan section)  Acute Rehab PT Goals Patient Stated Goal[place], do [place] PT Goal Formulation: With patient Time For Goal Achievement: 10/18/24 Potential to Achieve Goals: Good    Frequency 7X/week     Co-evaluation               AM-PAC PT 6 Clicks Mobility  Outcome Measure Help needed turning from your back to your side while in a flat bed without using bedrails?: A Little Help needed moving from lying on your back to sitting on the side of a flat bed without using bedrails?: A Little Help needed moving to and from a bed to a chair (including a wheelchair)?: A Lot Help needed standing up from a chair using your arms (e.g., wheelchair or bedside chair)?: A Little Help needed to walk in hospital room?: A Lot Help needed climbing 3-5 steps with a railing? : Total 6 Click Score: 14    End of Session Equipment Utilized During Treatment: Gait belt Activity Tolerance: Patient tolerated treatment well;Treatment limited secondary to medical complications (Comment) Patient left: in bed;with call bell/phone within reach;with family/visitor present Nurse Communication: Mobility status PT Visit Diagnosis: Difficulty in walking, not elsewhere classified (R26.2);Muscle  weakness (generalized) (M62.81)    Time: 8696-8673 PT Time Calculation (min) (ACUTE ONLY): 23 min   Charges:   PT Evaluation $PT Eval Moderate Complexity: 1 Mod PT Treatments $Therapeutic Activity: 8-22 mins PT General Charges $$ ACUTE PT VISIT: 1 Visit        Sylvan Delon Copp PT 10/11/2024  Acute Rehabilitation Services  Office [phone]

## 2024-10-11 NOTE — Progress Notes (Signed)
 Orthopedic Tech Progress Note Patient Details:  [name] [name] 12-13-1960 [id number]  Ortho Devices Type of Ortho Device: Bone foam zero knee Ortho Device/Splint Location: right Ortho Device/Splint Interventions: Ordered, Application, Adjustment   Post Interventions Patient Tolerated: Well Instructions Provided: Adjustment of device, Care of device  Waylan Thom Loving 10/11/2024, 11:52 AM

## 2024-10-12 ENCOUNTER — Encounter (HOSPITAL_COMMUNITY): Payer: Self-pay | Admitting: Orthopedic Surgery

## 2024-12-10 ENCOUNTER — Other Ambulatory Visit (HOSPITAL_COMMUNITY): Payer: Self-pay

## 2024-12-13 ENCOUNTER — Other Ambulatory Visit (HOSPITAL_COMMUNITY): Payer: Self-pay

## 2024-12-14 ENCOUNTER — Other Ambulatory Visit (HOSPITAL_COMMUNITY): Payer: Self-pay

## 2024-12-19 ENCOUNTER — Other Ambulatory Visit (HOSPITAL_COMMUNITY): Payer: Self-pay

## 2025-06-07 ENCOUNTER — Other Ambulatory Visit (HOSPITAL_COMMUNITY)
